# Patient Record
Sex: Female | Born: 1950 | Race: White | Hispanic: No | Marital: Married | Smoking: Never smoker
Health system: Southern US, Community
[De-identification: ages and names within clinical notes are randomized; demographics above are authoritative.]

## PROBLEM LIST (undated history)

## (undated) DIAGNOSIS — I639 Cerebral infarction, unspecified: Secondary | ICD-10-CM

## (undated) DIAGNOSIS — I10 Essential (primary) hypertension: Secondary | ICD-10-CM

## (undated) DIAGNOSIS — M549 Dorsalgia, unspecified: Secondary | ICD-10-CM

## (undated) DIAGNOSIS — C50919 Malignant neoplasm of unspecified site of unspecified female breast: Secondary | ICD-10-CM

## (undated) DIAGNOSIS — E785 Hyperlipidemia, unspecified: Secondary | ICD-10-CM

## (undated) DIAGNOSIS — F411 Generalized anxiety disorder: Secondary | ICD-10-CM

## (undated) DIAGNOSIS — M797 Fibromyalgia: Secondary | ICD-10-CM

## (undated) DIAGNOSIS — Z87440 Personal history of urinary (tract) infections: Secondary | ICD-10-CM

## (undated) HISTORY — DX: Fibromyalgia: M79.7

## (undated) HISTORY — DX: Personal history of urinary (tract) infections: Z87.440

## (undated) HISTORY — DX: Essential (primary) hypertension: I10

## (undated) HISTORY — DX: Dorsalgia, unspecified: M54.9

## (undated) HISTORY — DX: Generalized anxiety disorder: F41.1

## (undated) HISTORY — PX: OVARIAN CYST REMOVAL: SHX89

## (undated) HISTORY — DX: Hyperlipidemia, unspecified: E78.5

## (undated) HISTORY — DX: Malignant neoplasm of unspecified site of unspecified female breast: C50.919

## (undated) HISTORY — PX: ABDOMINAL HYSTERECTOMY: SHX81

## (undated) HISTORY — PX: BREAST LUMPECTOMY: SHX2

## (undated) HISTORY — DX: Cerebral infarction, unspecified: I63.9

## (undated) HISTORY — PX: TONSILLECTOMY: SUR1361

## (undated) HISTORY — PX: APPENDECTOMY: SHX54

---

## 2003-07-26 ENCOUNTER — Encounter: Admission: RE | Admit: 2003-07-26 | Discharge: 2003-07-26 | Payer: Self-pay | Admitting: Internal Medicine

## 2004-04-07 ENCOUNTER — Ambulatory Visit: Payer: Self-pay | Admitting: Family Medicine

## 2004-04-14 ENCOUNTER — Ambulatory Visit (HOSPITAL_BASED_OUTPATIENT_CLINIC_OR_DEPARTMENT_OTHER): Admission: RE | Admit: 2004-04-14 | Discharge: 2004-04-14 | Payer: Self-pay | Admitting: Urology

## 2004-04-14 ENCOUNTER — Encounter (INDEPENDENT_AMBULATORY_CARE_PROVIDER_SITE_OTHER): Payer: Self-pay | Admitting: Specialist

## 2004-04-14 ENCOUNTER — Ambulatory Visit (HOSPITAL_COMMUNITY): Admission: RE | Admit: 2004-04-14 | Discharge: 2004-04-14 | Payer: Self-pay | Admitting: Urology

## 2004-07-04 ENCOUNTER — Ambulatory Visit: Payer: Self-pay | Admitting: Family Medicine

## 2004-08-14 ENCOUNTER — Ambulatory Visit: Payer: Self-pay | Admitting: Family Medicine

## 2004-10-31 ENCOUNTER — Ambulatory Visit: Payer: Self-pay | Admitting: Family Medicine

## 2005-01-15 ENCOUNTER — Ambulatory Visit: Payer: Self-pay | Admitting: Family Medicine

## 2005-01-19 ENCOUNTER — Ambulatory Visit: Payer: Self-pay | Admitting: Family Medicine

## 2005-02-27 ENCOUNTER — Encounter: Admission: RE | Admit: 2005-02-27 | Discharge: 2005-02-27 | Payer: Self-pay | Admitting: Family Medicine

## 2005-03-12 ENCOUNTER — Encounter: Admission: RE | Admit: 2005-03-12 | Discharge: 2005-03-12 | Payer: Self-pay | Admitting: Family Medicine

## 2005-04-10 ENCOUNTER — Ambulatory Visit: Payer: Self-pay | Admitting: Family Medicine

## 2006-02-28 ENCOUNTER — Encounter: Admission: RE | Admit: 2006-02-28 | Discharge: 2006-02-28 | Payer: Self-pay | Admitting: Family Medicine

## 2006-07-02 ENCOUNTER — Ambulatory Visit: Payer: Self-pay | Admitting: Family Medicine

## 2006-07-02 LAB — CONVERTED CEMR LAB
ALT: 51 units/L — ABNORMAL HIGH (ref 0–40)
AST: 37 units/L (ref 0–37)
Albumin: 4.3 g/dL (ref 3.5–5.2)
Alkaline Phosphatase: 82 units/L (ref 39–117)
BUN: 15 mg/dL (ref 6–23)
Basophils Absolute: 0 10*3/uL (ref 0.0–0.1)
Basophils Relative: 0 % (ref 0.0–1.0)
Bilirubin, Direct: 0.1 mg/dL (ref 0.0–0.3)
CO2: 29 meq/L (ref 19–32)
Calcium: 9.3 mg/dL (ref 8.4–10.5)
Chloride: 106 meq/L (ref 96–112)
Cholesterol: 280 mg/dL (ref 0–200)
Creatinine, Ser: 0.8 mg/dL (ref 0.4–1.2)
Direct LDL: 212.8 mg/dL
Eosinophils Absolute: 0.3 10*3/uL (ref 0.0–0.6)
Eosinophils Relative: 3.6 % (ref 0.0–5.0)
GFR calc Af Amer: 96 mL/min
GFR calc non Af Amer: 79 mL/min
Glucose, Bld: 81 mg/dL (ref 70–99)
HCT: 38.8 % (ref 36.0–46.0)
HDL: 42.2 mg/dL (ref >39.0)
Hemoglobin: 13.4 g/dL (ref 12.0–15.0)
Lymphocytes Relative: 26.5 % (ref 12.0–46.0)
MCHC: 34.6 g/dL (ref 30.0–36.0)
MCV: 89.6 fL (ref 78.0–100.0)
Monocytes Absolute: 0.2 10*3/uL (ref 0.2–0.7)
Monocytes Relative: 2.1 % — ABNORMAL LOW (ref 3.0–11.0)
Neutro Abs: 5.4 10*3/uL (ref 1.4–7.7)
Neutrophils Relative %: 67.8 % (ref 43.0–77.0)
Platelets: 305 10*3/uL (ref 150–400)
Potassium: 3.9 meq/L (ref 3.5–5.1)
RBC: 4.34 M/uL (ref 3.87–5.11)
RDW: 12.2 % (ref 11.5–14.6)
Sodium: 142 meq/L (ref 135–145)
TSH: 0.95 microintl units/mL (ref 0.35–5.50)
Total Bilirubin: 0.9 mg/dL (ref 0.3–1.2)
Total CHOL/HDL Ratio: 6.6
Total Protein: 6.9 g/dL (ref 6.0–8.3)
Triglycerides: 168 mg/dL — ABNORMAL HIGH (ref 0–149)
VLDL: 34 mg/dL (ref 0–40)
WBC: 8 10*3/uL (ref 4.5–10.5)

## 2006-09-13 ENCOUNTER — Ambulatory Visit: Payer: Self-pay | Admitting: Family Medicine

## 2006-09-13 LAB — CONVERTED CEMR LAB
BUN: 16 mg/dL (ref 6–23)
Creatinine, Ser: 0.8 mg/dL (ref 0.4–1.2)

## 2006-09-16 ENCOUNTER — Ambulatory Visit: Payer: Self-pay | Admitting: Internal Medicine

## 2006-09-18 ENCOUNTER — Encounter: Admission: RE | Admit: 2006-09-18 | Discharge: 2006-09-18 | Payer: Self-pay | Admitting: Family Medicine

## 2006-09-20 DIAGNOSIS — R9409 Abnormal results of other function studies of central nervous system: Secondary | ICD-10-CM | POA: Insufficient documentation

## 2006-09-20 DIAGNOSIS — R209 Unspecified disturbances of skin sensation: Secondary | ICD-10-CM | POA: Insufficient documentation

## 2006-09-20 DIAGNOSIS — H02539 Eyelid retraction unspecified eye, unspecified lid: Secondary | ICD-10-CM | POA: Insufficient documentation

## 2006-09-24 ENCOUNTER — Telehealth: Payer: Self-pay | Admitting: Family Medicine

## 2007-02-11 ENCOUNTER — Ambulatory Visit: Payer: Self-pay | Admitting: Cardiology

## 2007-02-11 LAB — CONVERTED CEMR LAB
ALT: 40 units/L — ABNORMAL HIGH (ref 0–35)
AST: 29 units/L (ref 0–37)
Albumin: 4.1 g/dL (ref 3.5–5.2)
Alkaline Phosphatase: 72 units/L (ref 39–117)
Bilirubin, Direct: 0.1 mg/dL (ref 0.0–0.3)
Cholesterol: 198 mg/dL (ref 0–200)
HDL: 41.3 mg/dL (ref >39.0)
LDL Cholesterol: 121 mg/dL — ABNORMAL HIGH (ref 0–99)
Total Bilirubin: 0.7 mg/dL (ref 0.3–1.2)
Total CHOL/HDL Ratio: 4.8
Total Protein: 7 g/dL (ref 6.0–8.3)
Triglycerides: 178 mg/dL — ABNORMAL HIGH (ref 0–149)
VLDL: 36 mg/dL (ref 0–40)

## 2007-03-03 ENCOUNTER — Encounter: Admission: RE | Admit: 2007-03-03 | Discharge: 2007-03-03 | Payer: Self-pay | Admitting: Family Medicine

## 2007-03-05 ENCOUNTER — Encounter (INDEPENDENT_AMBULATORY_CARE_PROVIDER_SITE_OTHER): Payer: Self-pay | Admitting: *Deleted

## 2007-04-21 DIAGNOSIS — I10 Essential (primary) hypertension: Secondary | ICD-10-CM

## 2007-04-21 DIAGNOSIS — M7918 Myalgia, other site: Secondary | ICD-10-CM | POA: Insufficient documentation

## 2007-04-21 DIAGNOSIS — N390 Urinary tract infection, site not specified: Secondary | ICD-10-CM

## 2007-04-21 DIAGNOSIS — Z87440 Personal history of urinary (tract) infections: Secondary | ICD-10-CM | POA: Insufficient documentation

## 2007-04-21 DIAGNOSIS — E7800 Pure hypercholesterolemia, unspecified: Secondary | ICD-10-CM

## 2007-04-21 DIAGNOSIS — IMO0001 Reserved for inherently not codable concepts without codable children: Secondary | ICD-10-CM

## 2007-04-22 ENCOUNTER — Ambulatory Visit: Payer: Self-pay | Admitting: Family Medicine

## 2007-04-24 ENCOUNTER — Telehealth: Payer: Self-pay | Admitting: Family Medicine

## 2007-08-19 ENCOUNTER — Ambulatory Visit: Payer: Self-pay | Admitting: Family Medicine

## 2007-08-26 ENCOUNTER — Telehealth: Payer: Self-pay | Admitting: Family Medicine

## 2007-09-12 ENCOUNTER — Encounter: Admission: RE | Admit: 2007-09-12 | Discharge: 2007-09-12 | Payer: Self-pay | Admitting: Family Medicine

## 2007-09-12 ENCOUNTER — Encounter: Payer: Self-pay | Admitting: Family Medicine

## 2007-09-17 ENCOUNTER — Encounter (INDEPENDENT_AMBULATORY_CARE_PROVIDER_SITE_OTHER): Payer: Self-pay | Admitting: *Deleted

## 2007-09-24 ENCOUNTER — Ambulatory Visit: Payer: Self-pay | Admitting: Family Medicine

## 2007-09-26 LAB — CONVERTED CEMR LAB
ALT: 40 units/L — ABNORMAL HIGH (ref 0–35)
AST: 33 units/L (ref 0–37)
Albumin: 4.1 g/dL (ref 3.5–5.2)
Alkaline Phosphatase: 76 units/L (ref 39–117)
BUN: 13 mg/dL (ref 6–23)
Basophils Absolute: 0.1 10*3/uL (ref 0.0–0.1)
Basophils Relative: 1.2 % — ABNORMAL HIGH (ref 0.0–1.0)
Bilirubin, Direct: 0.1 mg/dL (ref 0.0–0.3)
CO2: 29 meq/L (ref 19–32)
Calcium: 9.2 mg/dL (ref 8.4–10.5)
Chloride: 109 meq/L (ref 96–112)
Cholesterol: 169 mg/dL (ref 0–200)
Creatinine, Ser: 0.9 mg/dL (ref 0.4–1.2)
Eosinophils Absolute: 0.3 10*3/uL (ref 0.0–0.7)
Eosinophils Relative: 5.2 % — ABNORMAL HIGH (ref 0.0–5.0)
GFR calc Af Amer: 83 mL/min
GFR calc non Af Amer: 69 mL/min
Glucose, Bld: 99 mg/dL (ref 70–99)
HCT: 39.8 % (ref 36.0–46.0)
HDL: 36.9 mg/dL — ABNORMAL LOW (ref >39.0)
Hemoglobin: 13.6 g/dL (ref 12.0–15.0)
LDL Cholesterol: 103 mg/dL — ABNORMAL HIGH (ref 0–99)
Lymphocytes Relative: 24.2 % (ref 12.0–46.0)
MCHC: 34.2 g/dL (ref 30.0–36.0)
MCV: 92.3 fL (ref 78.0–100.0)
Monocytes Absolute: 0.4 10*3/uL (ref 0.1–1.0)
Monocytes Relative: 6.1 % (ref 3.0–12.0)
Neutro Abs: 4 10*3/uL (ref 1.4–7.7)
Neutrophils Relative %: 63.3 % (ref 43.0–77.0)
Platelets: 240 10*3/uL (ref 150–400)
Potassium: 4.2 meq/L (ref 3.5–5.1)
RBC: 4.31 M/uL (ref 3.87–5.11)
RDW: 12.3 % (ref 11.5–14.6)
Sodium: 142 meq/L (ref 135–145)
TSH: 1.29 microintl units/mL (ref 0.35–5.50)
Total Bilirubin: 0.8 mg/dL (ref 0.3–1.2)
Total CHOL/HDL Ratio: 4.6
Total Protein: 6.8 g/dL (ref 6.0–8.3)
Triglycerides: 148 mg/dL (ref 0–149)
VLDL: 30 mg/dL (ref 0–40)
WBC: 6.3 10*3/uL (ref 4.5–10.5)

## 2008-03-03 ENCOUNTER — Encounter: Admission: RE | Admit: 2008-03-03 | Discharge: 2008-03-03 | Payer: Self-pay | Admitting: Family Medicine

## 2008-05-12 ENCOUNTER — Ambulatory Visit: Payer: Self-pay | Admitting: Family Medicine

## 2008-05-12 DIAGNOSIS — R4589 Other symptoms and signs involving emotional state: Secondary | ICD-10-CM | POA: Insufficient documentation

## 2008-05-18 ENCOUNTER — Ambulatory Visit: Payer: Self-pay | Admitting: Cardiovascular Disease

## 2008-07-01 ENCOUNTER — Ambulatory Visit: Payer: Self-pay | Admitting: Family Medicine

## 2008-07-13 ENCOUNTER — Telehealth (INDEPENDENT_AMBULATORY_CARE_PROVIDER_SITE_OTHER): Payer: Self-pay | Admitting: Internal Medicine

## 2008-08-18 ENCOUNTER — Encounter: Payer: Self-pay | Admitting: Family Medicine

## 2008-08-23 ENCOUNTER — Telehealth: Payer: Self-pay | Admitting: Family Medicine

## 2008-11-23 ENCOUNTER — Ambulatory Visit: Payer: Self-pay | Admitting: Family Medicine

## 2009-02-16 ENCOUNTER — Telehealth: Payer: Self-pay | Admitting: Family Medicine

## 2009-03-04 ENCOUNTER — Encounter: Admission: RE | Admit: 2009-03-04 | Discharge: 2009-03-04 | Payer: Self-pay | Admitting: Family Medicine

## 2009-03-11 ENCOUNTER — Encounter: Payer: Self-pay | Admitting: Family Medicine

## 2009-03-11 ENCOUNTER — Encounter: Admission: RE | Admit: 2009-03-11 | Discharge: 2009-03-11 | Payer: Self-pay | Admitting: Family Medicine

## 2009-03-14 ENCOUNTER — Encounter (INDEPENDENT_AMBULATORY_CARE_PROVIDER_SITE_OTHER): Payer: Self-pay | Admitting: *Deleted

## 2009-06-27 ENCOUNTER — Telehealth: Payer: Self-pay | Admitting: Family Medicine

## 2009-07-08 ENCOUNTER — Ambulatory Visit: Payer: Self-pay | Admitting: Internal Medicine

## 2009-08-08 ENCOUNTER — Telehealth: Payer: Self-pay | Admitting: Family Medicine

## 2010-01-02 ENCOUNTER — Telehealth: Payer: Self-pay | Admitting: Family Medicine

## 2010-03-06 ENCOUNTER — Encounter: Admission: RE | Admit: 2010-03-06 | Discharge: 2010-03-06 | Payer: Self-pay | Admitting: Family Medicine

## 2010-03-06 ENCOUNTER — Telehealth: Payer: Self-pay | Admitting: Family Medicine

## 2010-03-07 ENCOUNTER — Encounter: Payer: Self-pay | Admitting: Family Medicine

## 2010-03-08 ENCOUNTER — Encounter (INDEPENDENT_AMBULATORY_CARE_PROVIDER_SITE_OTHER): Payer: Self-pay | Admitting: *Deleted

## 2010-03-08 ENCOUNTER — Encounter: Payer: Self-pay | Admitting: Family Medicine

## 2010-03-14 ENCOUNTER — Ambulatory Visit: Payer: Self-pay | Admitting: Family Medicine

## 2010-03-14 ENCOUNTER — Encounter (INDEPENDENT_AMBULATORY_CARE_PROVIDER_SITE_OTHER): Payer: Self-pay | Admitting: *Deleted

## 2010-04-20 ENCOUNTER — Encounter (INDEPENDENT_AMBULATORY_CARE_PROVIDER_SITE_OTHER): Payer: Self-pay

## 2010-04-25 ENCOUNTER — Ambulatory Visit: Payer: Self-pay | Admitting: Gastroenterology

## 2010-04-25 ENCOUNTER — Ambulatory Visit: Payer: Self-pay | Admitting: Family Medicine

## 2010-05-15 ENCOUNTER — Encounter: Payer: Self-pay | Admitting: Gastroenterology

## 2010-05-15 ENCOUNTER — Ambulatory Visit
Admission: RE | Admit: 2010-05-15 | Discharge: 2010-05-15 | Payer: Self-pay | Source: Home / Self Care | Attending: Gastroenterology | Admitting: Gastroenterology

## 2010-05-15 LAB — HM COLONOSCOPY

## 2010-05-28 ENCOUNTER — Encounter: Payer: Self-pay | Admitting: Family Medicine

## 2010-06-06 NOTE — Medication Information (Signed)
Summary: Rx Order for [facility]  Rx Order for Labwork   Imported By: Beau Fanny 03/08/2010 15:24:06  _____________________________________________________________________  External Attachment:    Type:   Image     Comment:   External Document

## 2010-06-06 NOTE — Letter (Signed)
Summary: Pre Visit Letter Revised  [facility]  [address] [place], [place] [address]   Phone: [phone]  Fax: [phone]        03/14/2010 MRN: [id number] [facility] [address] [place], [place]  [address]             Procedure Date:  05/15/2010   Welcome to the Gastroenterology Division at [facility].    You are scheduled to see a nurse for your pre-procedure visit on 04/25/2010 at 1:00PM on the 3rd floor at [facility], [address]  We ask that you try to arrive at our office 15 minutes prior to your appointment time to allow for check-in.  Please take a minute to review the attached form.  If you answer "Yes" to one or more of the questions on the first page, we ask that you call the person listed at your earliest opportunity.  If you answer "No" to all of the questions, please complete the rest of the form and bring it to your appointment.    Your nurse visit will consist of discussing your medical and surgical history, your immediate family medical history, and your medications.   If you are unable to list all of your medications on the form, please bring the medication bottles to your appointment and we will list them.  We will need to be aware of both prescribed and over the counter drugs.  We will need to know exact dosage information as well.    Please be prepared to read and sign documents such as consent forms, a financial agreement, and acknowledgement forms.  If necessary, and with your consent, a friend or relative is welcome to sit-in on the nurse visit with you.  Please bring your insurance card so that we may make a copy of it.  If your insurance requires a referral to see a specialist, please bring your referral form from your primary care physician.  No co-pay is required for this nurse visit.     If you cannot keep your appointment, please call [phone] to cancel or reschedule prior to your appointment date.  This  allows [place] the opportunity to schedule an appointment for another patient in need of care.    Thank you for choosing [facility] for your medical needs.  We appreciate the opportunity to care for you.  Please visit [place] at our website  to learn more about our practice.  Sincerely, The Gastroenterology Division

## 2010-06-06 NOTE — Assessment & Plan Note (Signed)
Summary: cpx/dlo   Vital Signs:  Patient profile:   60 year old female Height:      64.25 inches Weight:      183.75 pounds BMI:     31.41 Temp:     98.1 degrees F oral Pulse rate:   76 / minute Pulse rhythm:   regular BP sitting:   128 / 80  (left arm) Cuff size:   regular  Vitals Entered By: Lewanda Rife LPN (March 14, 2010 2:36 PM) CC: CPX LMP complete hyst 1997   History of Present Illness: here for wellness exam  has been feeling good - no new med issues   thinks she may have dementia in the future  thinks her short term memory gets worse with time  some word searching at times , and needs reminders of things  she wants to watch this carefully experiences fibro fog -- ? this could be it   voltaren helps fibro pain  is interested in cymbalta -- to see if it will help with pain   was on effexor in the past -- did well with it -- was on it for emotional reasons years ago    wt is stable   good bp at 128/80  lipid-- on crestor trig 137 and HDL 42 and LDL 100 other labs ok    mam 10/11 nl  self exam - no lumps    colon screen-- wants to get colonscopy in jan if poss   Td 07 flu shot does not want one   nl dexa 09  calcium and vit D -- is good about  needs in pill box in am   hyst tot for b9 ov mass in past  no symptoms or problems at all     Allergies: 1)  Macrodantin  Past History:  Past Medical History: Last updated: 07/08/2009 Hypertension hyperlipidemia (lipid clinic briefly) back pain  fibromyalgia  frequent utis  anxiety/stress reaction  Past Surgical History: Last updated: 08/19/2007 Recurrent UTI's since age 22- hosp as a  child Hysterectomy-benign ovarian mass (1997)- total hyst  Dermoid tumor (1968 Vag delivery x 2 Bladder neck polyp (2005) 1 Short ureter Abd [place]- normal (01/2005)  Family History: Last updated: 05/12/2008 Father: HTN, dementia Mother: CABG age 55, OP, and thyroid problems (nodule) Siblings: 1 brother-  ETOH. 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 Eyes:  Denies blurring and eye irritation. CV:  Denies chest pain or discomfort, palpitations, shortness of breath with exertion, and swelling of feet. Resp:  Denies cough, shortness of breath, and wheezing. GI:  Denies abdominal pain, change in bowel habits, nausea, and vomiting. GU:  Denies abnormal vaginal bleeding, discharge, dysuria, and urinary frequency. MS:  Denies joint pain, joint redness, joint swelling, and cramps. Derm:  Denies itching, lesion(s), poor wound healing, and rash. Neuro:  Denies numbness and tingling. Psych:  mood is ok . Endo:  Denies cold intolerance, excessive thirst, excessive urination, and heat intolerance. Heme:  Denies abnormal bruising and bleeding.  Impression & Recommendations:  Problem # 1:  HEALTH MAINTENANCE EXAM (ICD-V70.0) Assessment Comment Only reviewed health habits including diet, exercise and skin cancer prevention reviewed health maintenance list and family history  rev wellness labs in detail   Problem # 2:  SCREENING, COLON CANCER (ICD-V76.51) Assessment: Comment Only ref for screening colonosc  Orders: Gastroenterology Referral (GI)  Problem # 3:  FIBROMYALGIA (ICD-729.1) Assessment: Deteriorated will try  cymbalta disc poss side eff and to use with caution with tryptans/ nsaids hope to get off nsaid if it works  f/u 1-2 mo -- will inc dose if helpful Her updated medication list for this problem  includes:    Diclofenac Sodium Cr 100 Mg Tb24 (Diclofenac sodium) .Marland Kitchen... Take 1 tablet by mouth once a day with food  Problem # 4:  HYPERCHOLESTEROLEMIA (ICD-272.0) Assessment: Unchanged good control with med and diet  rev labs in detail [facility]  Her updated medication list for this problem includes:    Crestor 5 Mg Tabs (Rosuvastatin calcium) .Marland Kitchen... 1 by mouth once daily  Problem # 5:  HYPERTENSION (ICD-401.9) Assessment: Unchanged  this is well controlled  no changes lab rev with pt  Her updated medication list for this problem includes:    Atenolol 25 Mg Tabs (Atenolol) .Marland Kitchen... Take 1 tablet by mouth once a day  BP today: 128/80 Prior BP: 132/82 (07/08/2009)  Labs Reviewed: K+: 4.2 (09/24/2007) Creat: : 0.9 (09/24/2007)   Chol: 169 (09/24/2007)   HDL: 36.9 (09/24/2007)   LDL: 103 (09/24/2007)   TG: 148 (09/24/2007)  Complete Medication List: 1)  Diclofenac Sodium Cr 100 Mg Tb24 (Diclofenac sodium) .... Take 1 tablet by mouth once a day with food 2)  Atenolol 25 Mg Tabs (Atenolol) .... Take 1 tablet by mouth once a day 3)  Imitrex 50 Mg Tabs (Sumatriptan succinate) .Marland Kitchen.. 1 by mouth as needed headache as directed 4)  Fish Oil 1000 Mg Caps (Omega-3 fatty acids) .... One by mouth qd 5)  Trimethaprim  .... Take by mouth as directed 6)  Vesicare 10 Mg Tabs (Solifenacin succinate) .... Take 1 tablet by mouth once a day 7)  Crestor 5 Mg Tabs (Rosuvastatin calcium) .Marland Kitchen.. 1 by mouth once daily 8)  Womens Multivitamin Plus Tabs (Multiple vitamins-minerals) .... Daily 9)  Tussionex Pennkinetic Er 8-10 Mg/28ml Lqcr (Chlorpheniramine-hydrocodone) .... 5 cc by mouth every 12 h as needed for cough symptoms 10)  Omeprazole 20 Mg Tbec (Omeprazole) .... Otc as directed. 11)  Cymbalta 30 Mg Cpep (Duloxetine hcl) .Marland Kitchen.. 1 by mouth once daily  Patient Instructions: 1)  update me if side effects of cymbalta 2)  no change in medicines  3)  we will refer for colonoscopy at check out 4)  folllow up  with me in 1-2 months for cymbalta check -- we may increase dose at this time  Prescriptions: CYMBALTA 30 MG CPEP (DULOXETINE HCL) 1 by mouth once daily  #30 x 3   Entered and Authorized by:   Judith Part MD   Signed by:   Judith Part MD on 03/14/2010   Method used:   Electronically to        [facility] (retail)       [address]       [place], [place]  [address]       Ph: [phone]       Fax: [phone]   RxID:   [id number] CRESTOR 5 MG  TABS (ROSUVASTATIN CALCIUM) 1 by mouth once daily  #30 x 11   Entered and Authorized by:   Judith Part MD   Signed by:   Judith Part MD on 03/14/2010   Method used:   Electronically to        [facility] (retail)       [address]       [place], [place]  [address]       Ph: [phone]       Fax: [phone]   RxID:   [id number] IMITREX 50 MG TABS (SUMATRIPTAN SUCCINATE) 1 by mouth as needed headache as directed  #3 months x 3   Entered and Authorized by:   Judith Part MD   Signed by:   Judith Part MD on 03/14/2010   Method used:   Electronically to        [facility] (retail)       [address]       [place], [place]  [address]       Ph: [phone]       Fax: [phone]   RxID:   [id number] ATENOLOL 25 MG TABS (ATENOLOL) Take 1 tablet by mouth once a day  #30 x 11   Entered and Authorized by:   Judith Part MD   Signed by:   Judith Part MD on 03/14/2010   Method used:   Electronically to        [facility] ([facility])       [address]       [place], [place]  [address]       Ph: [phone]       Fax: [phone]   RxID:   [id number] DICLOFENAC SODIUM CR 100 MG TB24 (DICLOFENAC SODIUM) Take 1 tablet by mouth once a day with food  #30 x 11   Entered and Authorized by:   Judith Part MD   Signed  by:   Judith Part MD on 03/14/2010   Method used:   Electronically to        [facility] (retail)       [address]       [place], [place]  [address]       Ph: [phone]       Fax: [phone]   RxID:   [id number]    Orders Added: 1)  Gastroenterology Referral [GI] 2)  Est. Patient 40-64 years [phone]    Current Allergies (reviewed today): MACRODANTIN

## 2010-06-06 NOTE — Progress Notes (Signed)
Summary: Diclofenac 100mg  XR   Phone Note Refill Request Call back at [phone] Message from:  [facility] on January 02, 2010 11:39 AM  Refills Requested: Medication #1:  DICLOFENAC SODIUM CR 100 MG TB24 Take 1 tablet by mouth once a day   Last Refilled: 11/27/2009 [facility] electronically request refill on diclofenac 100mg  Last filled date 11/27/09.Please advise.    Method Requested: Telephone to Pharmacy Initial call taken by: Lewanda Rife LPN,  January 02, 2010 11:39 AM  Follow-up for Phone Call        px written on EMR for call in  Follow-up by: Judith Part MD,  January 02, 2010 1:03 PM  Additional Follow-up for Phone Call Additional follow up Details #1::        [facility] instructed. Lewanda Rife LPN  January 02, 2010 2:13 PM     New/Updated Medications: DICLOFENAC SODIUM CR 100 MG TB24 (DICLOFENAC SODIUM) Take 1 tablet by mouth once a day with food Prescriptions: DICLOFENAC SODIUM CR 100 MG TB24 (DICLOFENAC SODIUM) Take 1 tablet by mouth once a day with food  #30 x 5   Entered and Authorized by:   Judith Part MD   Signed by:   Lewanda Rife LPN on 95/62/1308   Method used:   Telephoned to ...       [facility]ail[facility]       [address]       [place], [place]  [address]       Ph: [phone]       Fax: [phone]   RxID:   [id number]

## 2010-06-06 NOTE — Letter (Signed)
Summary: Results Follow up Letter  [facility] [facility]  [address] [place], [place] [address]   Phone: [phone]  Fax: [phone]    03/08/2010 MRN: [id number]  [facility] [address] [place], [place]  [address]  Dear Ms. Azucena Kuba,  The following are the results of your recent test(s):  Test         Result    Pap Smear:        Normal _____  Not Normal _____ Comments: ______________________________________________________ Cholesterol: LDL(Bad cholesterol):         Your goal is less than:         HDL (Good cholesterol):       Your goal is more than: Comments:  ______________________________________________________ Mammogram:        Normal __X___  Not Normal _____ Comments: Repeat in 1 year  ___________________________________________________________________ Hemoccult:        Normal _____  Not normal _______ Comments:    _____________________________________________________________________ Other Tests:    We routinely do not discuss normal results over the telephone.  If you desire a copy of the results, or you have any questions about this information we can discuss them at your next office visit.   Sincerely,    Sharilyn Sites for  Dr Roxy Manns

## 2010-06-06 NOTE — Progress Notes (Signed)
Summary: Diclofenac  Phone Note Refill Request Message from:  [facility] on June 27, 2009 9:08 AM  Refills Requested: Medication #1:  DICLOFENAC SODIUM CR 100 MG TB24 Take 1 tablet by mouth once a day [facility]   Last Fill Date:  05/26/2009   Pharmacy Phone:  [phone]   Method Requested: Electronic Initial call taken by: Delilah Shan CMA Duncan Dull),  June 27, 2009 9:08 AM  Follow-up for Phone Call        px written on EMR for call in  Follow-up by: Judith Part MD,  June 27, 2009 9:32 AM  Additional Follow-up for Phone Call Additional follow up Details #1::        Rx called to pharmacy Additional Follow-up by: Linde Gillis CMA Duncan Dull),  June 27, 2009 9:52 AM    Prescriptions: DICLOFENAC SODIUM CR 100 MG TB24 (DICLOFENAC SODIUM) Take 1 tablet by mouth once a day  #30 x 5   Entered and Authorized by:   Judith Part MD   Signed by:   Linde Gillis CMA (AAMA) on 06/27/2009   Method used:   Telephoned to ...         RxID:   [id number]

## 2010-06-06 NOTE — Progress Notes (Signed)
Summary: wants order for labwork  Phone Note Call from Patient   Caller: Patient Call For: Judith Part MD Summary of Call: Pt is coming in on 11/8 and wants lab work prior.  She is asking that order be faxed to her at [facility], fax number [phone], Eulah Citizen. Initial call taken by: Lowella Petties CMA, AAMA,  March 06, 2010 10:16 AM  Follow-up for Phone Call        order on px pad for comp met/ tsh / cbc with diff/ lipid  in IN box Follow-up by: Judith Part MD,  March 07, 2010 8:23 AM  Additional Follow-up for Phone Call Additional follow up Details #1::        Lab request faxed to [phone] as instructed. Patient notified as instructed by telephone. Lewanda Rife LPN  March 07, 2010 2:29 PM

## 2010-06-06 NOTE — Progress Notes (Signed)
Summary: imitrex  Phone Note Refill Request Message from:  [facility] on August 08, 2009 11:29 AM  Refills Requested: Medication #1:  IMITREX 50 MG TABS 1 by mouth as needed headache as directed [facility]   Last Fill Date:  04/24/2007   Pharmacy Phone:  [phone]   Method Requested: Electronic Initial call taken by: Delilah Shan CMA (AAMA),  August 08, 2009 11:29 AM  Follow-up for Phone Call        px written on EMR for call in  Follow-up by: Judith Part MD,  August 08, 2009 12:15 PM  Additional Follow-up for Phone Call Additional follow up Details #1::        [facility] to [facility] pharmacy as instructed. Lewanda Rife LPN  August 09, 7827 3:11 PM     New/Updated Medications: IMITREX 50 MG TABS (SUMATRIPTAN SUCCINATE) 1 by mouth as needed headache as directed Prescriptions: IMITREX 50 MG TABS (SUMATRIPTAN SUCCINATE) 1 by mouth as needed headache as directed  #3 months x 3   Entered and Authorized by:   Judith Part MD   Signed by:   Lewanda Rife LPN on 56/21/3086   Method used:   Telephoned to ...       [facility]ail[facility]       [address]       [place], [place]  [address]       Ph: [phone]       Fax: [phone]   RxID:   [id number]

## 2010-06-06 NOTE — Assessment & Plan Note (Signed)
Summary: DR Milinda Antis PT/OK [facility] PT(S)--CHEST CONGEST W/COUGH-STC   Vital Signs:  Patient profile:   60 year old female Height:      64.25 inches (163.19 cm) Weight:      183.0 pounds (83.18 kg) O2 Sat:      96 % on Room air Temp:     98.7 degrees F (37.06 degrees C) oral Pulse rate:   70 / minute BP sitting:   132 / 82  (left arm) Cuff size:   regular  Vitals Entered By: Orlan Leavens (July 08, 2009 10:03 AM)  O2 Flow:  Room air CC: Ongoing cough/ chest congestion/ sore throat now x's 3 weeks, URI symptoms Is Patient Diabetic? No Pain Assessment Patient in pain? no        Primary Care Provider:  Colon Flattery Tower MD  CC:  Ongoing cough/ chest congestion/ sore throat now x's 3 weeks and URI symptoms.  History of Present Illness:  URI Symptoms      This is a 60 year old woman who presents with URI symptoms.  The symptoms began 3 weeks ago.  The severity is described as moderate.  The patient reports nasal congestion, clear nasal discharge, sore throat, dry cough, and sick contacts, but denies productive cough.  The patient denies fever, stiff neck, and dyspnea.  The patient also reports itchy throat, sneezing, seasonal symptoms, response to antihistamine, headache, and severe fatigue.  The patient denies muscle aches.  The patient denies the following risk factors for Strep sinusitis: Strep exposure and tender adenopathy.    Current Medications (verified): 1)  Diclofenac Sodium Cr 100 Mg Tb24 (Diclofenac Sodium) .... Take 1 Tablet By Mouth Once A Day 2)  Atenolol 25 Mg Tabs (Atenolol) .... Take 1 Tablet By Mouth Once A Day 3)  Imitrex 50 Mg Tabs (Sumatriptan Succinate) .Marland Kitchen.. 1 By Mouth As Needed Headache As Directed 4)  Fish Oil 1000 Mg  Caps (Omega-3 Fatty Acids) .... One By Mouth Qd 5)  Trimethaprim .... Take By Mouth As Directed 6)  Vesicare 10 Mg  Tabs (Solifenacin Succinate) .... Take 1 Tablet By Mouth Once A Day 7)  Crestor 5 Mg  Tabs (Rosuvastatin Calcium) .Marland Kitchen.. 1 By  Mouth Once Daily 8)  Womens Multivitamin Plus  Tabs (Multiple Vitamins-Minerals) .... 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  The patient denies anorexia, weight loss, chest pain, and abdominal pain.    Physical Exam  General:  alert, well-developed, well-nourished, and cooperative to examination.   mild hoarseness noted, congested cough Eyes:  vision grossly intact; pupils equal, round and reactive to light.  conjunctiva and lids normal.    Ears:  normal pinnae bilaterally, without erythema, swelling, or tenderness to palpation. TMs clear, without effusion, or cerumen impaction. Hearing grossly normal bilaterally  Mouth:  teeth and gums in good repair; mucous membranes moist, without lesions or ulcers. oropharynx clear without exudate, mod erythema.  Lungs:  normal respiratory effort, no intercostal retractions or use of accessory muscles; normal breath sounds bilaterally - no crackles and no wheezes.  Heart:  normal rate, regular rhythm, no murmur, and no rub. BLE without edema.   Impression & Recommendations:  Problem # 1:  BRONCHITIS NOT SPECIFIED AS ACUTE OR CHRONIC (ICD-490)  Her updated medication list for this problem includes:    Tussionex Pennkinetic Er 8-10 Mg/28ml Lqcr (Chlorpheniramine-hydrocodone) .[facility] 5 cc by mouth every 12 h as needed for cough symptoms    Azithromycin 250 Mg Tabs (Azithromycin) .Marland Kitchen... 2 tabs by mouth today, then 1 by mouth daily starting tomorrow  Take antibiotics and other medications as  directed. Encouraged to push clear liquids, get enough rest, and take acetaminophen as needed. To be seen in 5-7 days if no improvement, sooner if worse.  Orders: Prescription Created Electronically [phone])  Complete Medication List: 1)  Diclofenac Sodium Cr 100 Mg Tb24 (Diclofenac sodium) .... Take 1 tablet by mouth once a day 2)  Atenolol 25 Mg Tabs (Atenolol) .... Take 1 tablet by mouth once a day 3)  Imitrex 50 Mg Tabs (Sumatriptan succinate) .Marland Kitchen.. 1 by mouth as needed headache as directed 4)  Fish Oil 1000 Mg Caps (Omega-3 fatty acids) .... One by mouth qd 5)  Trimethaprim  .... Take by mouth as directed 6)  Vesicare 10 Mg Tabs (Solifenacin succinate) .... Take 1 tablet by mouth once a day 7)  Crestor 5 Mg Tabs (Rosuvastatin calcium) .Marland Kitchen.. 1 by mouth once daily 8)  Womens Multivitamin Plus Tabs (Multiple vitamins-minerals) .... Daily 9)  Tussionex Pennkinetic Er 8-10 Mg/65ml Lqcr (Chlorpheniramine-hydrocodone) .... 5 cc by mouth every 12 h as needed for cough symptoms 10)  Azithromycin 250 Mg Tabs (Azithromycin) .... 2 tabs by mouth today, then 1 by mouth daily starting tomorrow  Patient Instructions: 1)  it was good to see you today. 2)  Zpack and Tussionex as discussed - your antibiotic prescription has been electronically submitted to your pharmacy. Please take as directed. Contact our office if you believe you're having problems with the medication(s).  3)  Get plenty of rest, drink lots of clear liquids, and use Tylenol or Ibuprofen for fever and comfort. Return in 7-10 days if you're not better:sooner if you're feeling worse. ok to continue Theraflu as needed  Prescriptions: AZITHROMYCIN 250 MG TABS (AZITHROMYCIN) 2 tabs by mouth today, then 1 by mouth daily starting tomorrow  #6 x 0   Entered and Authorized by:   Newt Lukes MD   Signed by:   Newt Lukes MD on 07/08/2009   Method used:   Electronically to        [facility]       [address]       [place], [place]  [address]       Ph: [phone]       Fax: [phone]   RxID:   [id number] TUSSIONEX PENNKINETIC ER 8-10 MG/5ML LQCR (CHLORPHENIRAMINE-HYDROCODONE) 5 cc by mouth every 12 h as needed for cough symptoms  #8oz x 0   Entered and Authorized by:   Newt Lukes MD   Signed by:   Newt Lukes MD on 07/08/2009   Method used:   Print then Give to Patient   RxID:   [id number]

## 2010-06-06 NOTE — Miscellaneous (Signed)
Summary: Mammgram update   Clinical Lists Changes  Observations: Added new observation of MAMMO DUE: 03/2011 (03/08/2010 15:42) Added new observation of MAMMOGRAM: normal (03/06/2010 15:42)      Preventive Care Screening  Mammogram:    Date:  03/06/2010    Next Due:  03/2011    Results:  normal

## 2010-06-08 NOTE — Assessment & Plan Note (Signed)
Summary: 1-2 month follow up for cymbalta check/rbh   Vital Signs:  Patient profile:   60 year old female Height:      64.25 inches Weight:      181.50 pounds BMI:     31.02 Temp:     97.7 degrees F oral Pulse rate:   68 / minute Pulse rhythm:   regular BP sitting:   128 / 84  (left arm) Cuff size:   regular  Vitals Entered By: Lewanda Rife LPN (April 25, 2010 3:23 PM) CC: 1-2 month f/u for Cymbalta, Abdominal Pain   History of Present Illness: here for f/u of cymbalta for fibromyalgia  is sleeping better and deeper for sure -- and that is helping her day time energy level  is in more pain lately  ? weather effects  is still diclofenac  no exercise   mood wise - no significant changes  mood has been ok overall    no acess to water exercise - unfortunately  wt is down 2 lb bmi is 31  bp fine     Allergies: 1)  Macrodantin  Past History:  Past Medical History: Last updated: 07/08/2009 Hypertension hyperlipidemia (lipid clinic briefly) back pain  fibromyalgia  frequent utis  anxiety/stress reaction  Past Surgical History: Last updated: 08/19/2007 Recurrent UTI's since age 40- hosp as a  child Hysterectomy-benign ovarian mass (1997)- total hyst  Dermoid tumor (1968 Vag delivery x 2 Bladder neck polyp (2005) 1 Short ureter Abd [place]- normal (01/2005)  Family History: Last updated: 05/12/2008 Father: HTN, dementia Mother: CABG age 406, OP, and thyroid problems (nodule) Siblings: 1 brother- ETOH. 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 Eyes:  Denies blurring, eye irritation, and eye pain. CV:  Denies chest pain or discomfort, palpitations, shortness of breath with exertion, and swelling of feet. Resp:  Denies cough, shortness of breath, and wheezing. GI:  Denies indigestion and nausea. GU:  Denies dysuria and urinary frequency. MS:  Complains of joint pain, muscle aches, and stiffness; denies joint redness, joint swelling, cramps, and muscle weakness. Derm:  Denies itching, lesion(s), poor wound healing, and rash. Neuro:  Denies headaches, numbness, tingling, and weakness. Psych:  mood is ok. Endo:  Denies cold intolerance, excessive thirst, excessive urination, and heat intolerance. Heme:  Denies abnormal bruising and bleeding.  Physical Exam  General:  overweight but generally well appearing  Head:  normocephalic, atraumatic, and no abnormalities observed.   Eyes:  vision grossly intact, pupils equal, pupils round, and pupils reactive to light.  no conjunctival pallor, injection or icterus  Mouth:  pharynx pink and moist.   Neck:  supple with full rom and no masses or thyromegally, no JVD or carotid bruit  Chest Wall:  No deformities, masses, or tenderness noted. Lungs:  normal respiratory effort, no intercostal retractions or use of accessory muscles; normal breath sounds bilaterally - no crackles and no wheezes.    Heart:  normal rate, regular rhythm, no murmur, and no rub. BLE without edema. 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   Neurologic:  sensation intact to light touch, gait normal, and DTRs symmetrical and normal.   Skin:  Intact without suspicious lesions or rashes Cervical Nodes:  No lymphadenopathy noted Psych:  normal affect, talkative and pleasant    Impression & Recommendations:  Problem  # 1:  FIBROMYALGIA (ICD-729.1) Assessment Improved  sleep has improved with cymbalta and perhaps mood a bit but not pain so far will adv dose to 60 mg and update  pt aware to call if any adv effects or mood changes enc low impact exercise f/u 2-3 mo Her updated medication list for this problem includes:    Diclofenac Sodium Cr 100 Mg Tb24 (Diclofenac sodium) .[facility]... Take 1 tablet by mouth once a day with food  Orders: Prescription Created Electronically [phone])  Complete Medication List: 1)  Diclofenac Sodium Cr 100 Mg Tb24 (Diclofenac sodium) .... Take 1 tablet by mouth once a day with food 2)  Atenolol 25 Mg Tabs (Atenolol) .... Take 1 tablet by mouth once a day 3)  Imitrex 50 Mg Tabs (Sumatriptan succinate) .Marland Kitchen.. 1 by mouth as needed headache as directed 4)  Fish Oil 1000 Mg Caps (Omega-3 fatty acids) .... One by mouth daily 5)  Trimethaprim  .... Take by mouth as directed 6)  Vesicare 10 Mg Tabs (Solifenacin succinate) .... Take 1 tablet by mouth once a day 7)  Crestor 5 Mg Tabs (Rosuvastatin calcium) .Marland Kitchen.. 1 by mouth once daily 8)  Womens Multivitamin Plus Tabs (Multiple vitamins-minerals) .... Daily 9)  Tussionex Pennkinetic Er 8-10 Mg/68ml Lqcr (Chlorpheniramine-hydrocodone) .... 5 cc by mouth every 12 h as needed for cough symptoms 10)  Omeprazole 20 Mg Tbec (Omeprazole) .... Otc as directed. 11)  Cymbalta 60 Mg Cpep (Duloxetine hcl) .Marland Kitchen.. 1 by mouth once daily 12)  Moviprep 100 Gm Solr (Peg-kcl-nacl-nasulf-na asc-c) .... As per prep instructions.   Patient Instructions: 1)  increase cymbalta from 30 to 60 mg once daily  2)  new px sent to your pharmacy  3)  if any side effects or problems let me know  4)  follow up in 2-3 months  5)  think about low impact exercise  6)  easy/ slow begginers yoga or water exercise  Prescriptions: CYMBALTA 60 MG CPEP (DULOXETINE HCL) 1 by mouth once daily  #30 x 11   Entered and Authorized by:   Judith Part MD   Signed by:   Judith Part MD on 04/25/2010   Method used:   Electronically to        [facility] ([facility]ail)       [address]       [place], [place]  [address]       Ph: [phone]       Fax: [phone]   RxID:   [id number]    Orders Added: 1)  Prescription Created Electronically [G8553] 2)  Est. Patient Level III [44010]    Current Allergies (reviewed today): MACRODANTIN

## 2010-06-08 NOTE — Miscellaneous (Signed)
Summary: Lec previsit  Clinical Lists Changes  Medications: Added new medication of MOVIPREP 100 GM  SOLR (PEG-KCL-NACL-NASULF-NA ASC-C) As per prep instructions. - Signed Rx of MOVIPREP 100 GM  SOLR (PEG-KCL-NACL-NASULF-NA ASC-C) As per prep instructions.;  #1 x 0;  Signed;  Entered by: Ulis Rias RN;  Authorized by: Rachael Fee MD;  Method used: Electronically to [facility] [address], [place], [place], [place]  [address], Ph: [phone], Fax: [phone] Observations: Added new observation of ALLERGY REV: Done (04/25/2010 13:00)    Prescriptions: MOVIPREP 100 GM  SOLR (PEG-KCL-NACL-NASULF-NA ASC-C) As per prep instructions.  #1 x 0   Entered by:   Ulis Rias RN   Authorized by:   Rachael Fee MD   Signed by:   Ulis Rias RN on 04/25/2010   Method used:   Electronically to        [facility]       [address]       [place], [place]  [address]       Ph: [phone]       Fax: [phone]   RxID:   [id number]

## 2010-06-08 NOTE — Procedures (Signed)
Summary: Colonoscopy  Patient: [name] Note: All result statuses are Final unless otherwise noted.  Tests: (1) Colonoscopy (COL)   COL Colonoscopy           DONE     [facility]     [address]     [place], [place]  [address]           COLONOSCOPY PROCEDURE REPORT           PATIENT:  Amy, Mills  MR#:  [id number]     BIRTHDATE:  Dec 12, 1950, 59 yrs. old  GENDER:  female     ENDOSCOPIST:  Rachael Fee, MD     REF. BY:  Marne A. Milinda Antis, M.D.     PROCEDURE DATE:  05/15/2010     PROCEDURE:  Diagnostic Colonoscopy     ASA CLASS:  Class II     INDICATIONS:  Routine Risk Screening     MEDICATIONS:   Fentanyl 50 mcg IV, Versed 7 mg IV           DESCRIPTION OF PROCEDURE:   After the risks benefits and     alternatives of the procedure were thoroughly explained, informed     consent was obtained.  Digital rectal exam was performed and     revealed no rectal masses.   The LB [id number] endoscope     was introduced through the anus and advanced to the cecum, which     was identified by both the appendix and ileocecal valve, without     limitations.  The quality of the prep was good, using MoviPrep.     The instrument was then slowly withdrawn as the colon was fully     examined.     <<PROCEDUREIMAGES>>     FINDINGS:  Mild diverticulosis was found in the sigmoid to     descending colon segments (see image1).  This was otherwise a     normal examination of the colon (see image3, image4, and image5).     Retroflexed views in the rectum revealed no abnormalities.    The     scope was then withdrawn from the patient and the procedure     completed.     COMPLICATIONS:  None           ENDOSCOPIC IMPRESSION:     1) Mild diverticulosis in the sigmoid to descending colon     segments     2) Otherwise normal examination; no polyps or cancers           RECOMMENDATIONS:     1) You should continue to follow colorectal cancer screening     guidelines for "routine risk"  patients with a repeat colonoscopy     in 10 years. There is no need for FOBT (stool) testing for at     least 5 years.           REPEAT EXAM:  10 years           ______________________________     Rachael Fee, MD           n.     eSIGNED:   Rachael Fee at 05/15/2010 09:31 AM           [name], [id number]  Note: An exclamation mark (!) indicates a result that was not dispersed into the flowsheet. Document Creation Date: 05/15/2010 9:31 AM _______________________________________________________________________  (1) Order result status: Final Collection or observation date-time: 05/15/2010 09:28 Requested date-time:  Receipt date-time:  Reported date-time:  Referring Physician:   Ordering Physician: Rob Bunting [phone]) Specimen Source:  Source: Launa Grill Order Number: [id number] Lab site:   Appended Document: Colonoscopy    Clinical Lists Changes  Observations: Added new observation of COLONNXTDUE: 05/2020 (05/15/2010 11:34)

## 2010-06-08 NOTE — Letter (Signed)
Summary: Moviprep Instructions  [facility]  [address]   [place], [place] [address]   Phone: [phone]  Fax: [phone]       Amy Mills    10/23/1950    MRN: [id number]        Procedure Day Dorna Bloom: Monday, 05-15-10     Arrival Time: 8:30 a.m.     Procedure Time: 9:30 a.m.     Location of Procedure:                    x   [facility] (4th Floor)   PREPARATION FOR COLONOSCOPY WITH MOVIPREP   Starting 5 days prior to your procedure 05-10-10 do not eat nuts, seeds, popcorn, corn, beans, peas,  salads, or any raw vegetables.  Do not take any fiber supplements (e.g. Metamucil, Citrucel, and Benefiber).  THE DAY BEFORE YOUR PROCEDURE         DATE: 05-14-10   DAY: Sunday  1.  Drink clear liquids the entire day-NO SOLID FOOD  2.  Do not drink anything colored red or purple.  Avoid juices with pulp.  No orange juice.  3.  Drink at least 64 oz. (8 glasses) of fluid/clear liquids during the day to prevent dehydration and help the prep work efficiently.  CLEAR LIQUIDS INCLUDE: Water Jello Ice Popsicles Tea (sugar ok, no milk/cream) Powdered fruit flavored drinks Coffee (sugar ok, no milk/cream) Gatorade Juice: apple, white grape, white cranberry  Lemonade Clear bullion, consomm, broth Carbonated beverages (any kind) Strained chicken noodle soup Hard Candy                             4.  In the morning, mix first dose of MoviPrep solution:    Empty 1 Pouch A and 1 Pouch B into the disposable container    Add lukewarm drinking water to the top line of the container. Mix to dissolve    Refrigerate (mixed solution should be used within 24 hrs)  5.  Begin drinking the prep at 5:00 p.m. The MoviPrep container is divided by 4 marks.   Every 15 minutes drink the solution down to the next mark (approximately 8 oz) until the full liter is complete.   6.  Follow completed prep with 16 oz of clear liquid of your choice (Nothing red or purple).   Continue to drink clear liquids until bedtime.  7.  Before going to bed, mix second dose of MoviPrep solution:    Empty 1 Pouch A and 1 Pouch B into the disposable container    Add lukewarm drinking water to the top line of the container. Mix to dissolve    Refrigerate  THE DAY OF YOUR PROCEDURE      DATE: 05-15-10  DAY: Monday  Beginning at 4:30 a.m. (5 hours before procedure):         1. Every 15 minutes, drink the solution down to the next mark (approx 8 oz) until the full liter is complete.  2. Follow completed prep with 16 oz. of clear liquid of your choice.    3. You may drink clear liquids until 7:30 a.m. (2 HOURS BEFORE PROCEDURE).   MEDICATION INSTRUCTIONS  Unless otherwise instructed, you should take regular prescription medications with a small sip of water   as early as possible the morning of your procedure.         OTHER INSTRUCTIONS  You will need a responsible adult  at least 60 years of age to accompany you and drive you home.   This person must remain in the waiting room during your procedure.  Wear loose fitting clothing that is easily removed.  Leave jewelry and other valuables at home.  However, you may wish to bring a book to read or  an iPod/MP3 player to listen to music as you wait for your procedure to start.  Remove all body piercing jewelry and leave at home.  Total time from sign-in until discharge is approximately 2-3 hours.  You should go home directly after your procedure and rest.  You can resume normal activities the  day after your procedure.  The day of your procedure you should not:   Drive   Make legal decisions   Operate machinery   Drink alcohol   Return to work  You will receive specific instructions about eating, activities and medications before you leave.    The above instructions have been reviewed and explained to me by  Ulis Rias RN  April 25, 2010 1:27 PM     I fully understand and can verbalize  these instructions _____________________________ Date _________

## 2010-06-10 ENCOUNTER — Encounter: Payer: Self-pay | Admitting: Family Medicine

## 2010-07-13 ENCOUNTER — Telehealth: Payer: Self-pay | Admitting: Family Medicine

## 2010-07-18 NOTE — Progress Notes (Signed)
Summary: [facility]  Phone Note From Pharmacy   Caller: [facility] Summary of Call: [facility] is asking for new scripts for cymbalta, atenolol, crestor, diclofenac.  Forms are in your box,  Dr. Royden Purl pt. and she is out until monday. Initial call taken by: Lowella Petties CMA, AAMA,  July 13, 2010 8:40 AM  Follow-up for Phone Call        filled.  please input my DEA/ NPI.  Follow-up by: Eustaquio Boyden  MD,  July 13, 2010 10:26 PM  Additional Follow-up for Phone Call Additional follow up Details #1::        Forms faxed.                Lowella Petties CMA, AAMA  July 14, 2010 8:14 AM

## 2010-07-25 ENCOUNTER — Encounter: Payer: Self-pay | Admitting: Family Medicine

## 2010-07-25 ENCOUNTER — Ambulatory Visit (INDEPENDENT_AMBULATORY_CARE_PROVIDER_SITE_OTHER): Payer: 59 | Admitting: Family Medicine

## 2010-07-25 VITALS — BP 124/78 | HR 64 | Temp 98.3°F | Ht 64.0 in | Wt 173.0 lb

## 2010-07-25 DIAGNOSIS — J019 Acute sinusitis, unspecified: Secondary | ICD-10-CM | POA: Insufficient documentation

## 2010-07-25 DIAGNOSIS — IMO0001 Reserved for inherently not codable concepts without codable children: Secondary | ICD-10-CM

## 2010-07-25 MED ORDER — DULOXETINE HCL 30 MG PO CPEP: 30.0000 mg | ORAL_CAPSULE | Freq: Every day | ORAL | Status: DC

## 2010-07-25 MED ORDER — AZITHROMYCIN 250 MG PO TABS: ORAL_TABLET | ORAL | Status: AC

## 2010-07-25 NOTE — Assessment & Plan Note (Signed)
Treat with zithromax as pt is intol to augmentin Disc sinus hygiene-saline nasal spray Update if not improved

## 2010-07-25 NOTE — Assessment & Plan Note (Signed)
No improvement in 60 above the 30 and in fact not enough imp to continue Plan will be to decrease to 30 for 2 weeks and then discontinue Pt will consider gabapentin or lyrica in the future- but not now Enc further low impact exercise and good diet

## 2010-07-25 NOTE — Patient Instructions (Signed)
Take cymbalta down to 30 mg daily for 2 weeks and then stop if you want to  I sent px to your pharmacy Also take zpak for the sinus infection Try nasal saline spray for congestion  Please update me if sinus symptoms do not improve

## 2010-07-25 NOTE — Progress Notes (Signed)
Subjective:    Patient ID: Amy Mills, female    DOB: 15-Jul-1950, 60 y.o.   MRN: [id number]  HPI Here for f/u of fibro on cymbalta Cannot really decide how much it is helping at the 60 mg dose Was sleeping better on the 30 vs the 60  Also helped mood at 30 also -- then at 60 her affect was blah and she felt in the dumps for a while-- seems better now  Pain has not changed  ? Not noticed enough improvement to continue it honestly  No side effects overall  Still on voltaren also  Does not want to try other meds or pain control  Is used to the pain  Good and bad days  Pain varies -- as of late pain has been worse in L hip and up into her back   Wt did decrease 8 lb   ? If that was related to anything - eating habits did not really change    Last time she went to have her px filled- the pharmacy forgot to fill the atenolol Started having headaches and inc bp  Back on since Friday     Has actually had a headache for 2 days (poss fever-- some chills and aches)  Had a bad cold 6-7 weeks ago -- still has congestion and drainage --  ? Allergies  Wonders if she could have a sinus infection overall  Feels pressure in face --under eyes and above them  Mostly on the R side  Blowing out mostly clear mucous -- was previously green and yellow --now some flecks of blood  Some cough as well -- mostly at night -- dry Usually does not have env allergies   No longer using cough medicine  Still has a mild dry cough, however   Past Medical History  Diagnosis Date  . Hypertension   . HLD (hyperlipidemia)   . Back pain   . Fibromyalgia   . History of recurrent UTIs   . Anxiety reaction     History   Social History  . Marital Status: Married    Spouse Name: N/A    Number of Children: N/A  . Years of Education: N/A   Occupational History  . Not on file.   Social History Main Topics  . Smoking status: Never Smoker   . Smokeless tobacco: Not on file  . Alcohol Use: Yes  . Drug  Use: No  . Sexually Active:    Other Topics Concern  . Not on file   Social History Narrative  . No narrative on file    No past surgical history on file.  Family History  Problem Relation Age of Onset  . Thyroid nodules Mother   . Heart disease Mother   . Hypertension Father   . Dementia Father   . Alcohol abuse Brother   . Hyperlipidemia Daughter   . Hypertension Daughter   . Cancer Maternal Aunt     breast  . Cancer Paternal Aunt     breast     Review of Systems  Constitutional: Positive for fatigue. Negative for fever and chills.  HENT: Positive for congestion, rhinorrhea, postnasal drip and sinus pressure. Negative for ear pain, nosebleeds and facial swelling.   Eyes: Negative for redness and visual disturbance.  Respiratory: Positive for cough. Negative for shortness of breath and wheezing.   Cardiovascular: Negative for chest pain and leg swelling.  Gastrointestinal: Negative for abdominal pain.  Genitourinary: Negative for frequency.  Musculoskeletal: Positive for myalgias and back pain. Negative for joint swelling.  Skin: Negative for color change and rash.  Neurological: Positive for headaches. Negative for syncope, weakness, light-headedness and numbness.  Hematological: Does not bruise/bleed easily.  Psychiatric/Behavioral: Positive for sleep disturbance. The patient is not nervous/anxious.        Objective:   Physical Exam  Constitutional: She appears well-developed and well-nourished. No distress.  HENT:  Head: Normocephalic and atraumatic.       bilat frontal and maxillary sinus tenderness  Eyes: Conjunctivae are normal. Pupils are equal, round, and reactive to light. Right eye exhibits no discharge. Left eye exhibits no discharge.  Neck: Neck supple. No thyromegaly present.  Cardiovascular: Normal rate and regular rhythm.   Pulmonary/Chest: Effort normal and breath sounds normal. She has no rales.  Musculoskeletal: Normal range of motion. She  exhibits tenderness. She exhibits no edema.       Diffusely tender shoulder girdle  No trigger points on back today No acute joint changes  Lymphadenopathy:    She has no cervical adenopathy.  Neurological: She displays normal reflexes. She exhibits normal muscle tone.  Skin: Skin is warm and dry. No rash noted.  Psychiatric: She has a normal mood and affect.   Past Medical History  Diagnosis Date  . Hypertension   . HLD (hyperlipidemia)   . Back pain   . Fibromyalgia   . History of recurrent UTIs   . Anxiety reaction    No past surgical history on file.  reports that she has never smoked. She does not have any smokeless tobacco history on file. She reports that she drinks alcohol. She reports that she does not use illicit drugs. family history includes Alcohol abuse in her brother; Cancer in her maternal aunt and paternal aunt; Dementia in her father; Heart disease in her mother; Hyperlipidemia in her daughter; Hypertension in her daughter and father; and Thyroid nodules in her mother. Allergies  Allergen Reactions  . Augmentin Diarrhea    Diarrhea   . Nitrofurantoin     REACTION: hives           Assessment & Plan:

## 2010-07-27 ENCOUNTER — Encounter: Payer: Self-pay | Admitting: Family Medicine

## 2010-09-19 NOTE — Assessment & Plan Note (Signed)
[facility]                               LIPID CLINIC NOTE   NAME:Amy Mills, Defino                         MRN:          [id number]  DATE:09/16/2006                            DOB:          [birth year]    The patient is seen in the lipid clinic for evaluation of medication  titration associated with her hyperlipidemia in the setting of  previously elevated liver function tests when using Vytorin.  The  patient states she is not certain how high her LFTs went.  She  discontinue Vytorin approximately a year ago because it did not agree  with her liver but has had improvement since that time.   She does pay attention to her diet.  She reveals a breakfast pertinent  for wheat toast with peanut butter and Nutrigrain bar or banana, always  with orange juice.  Lunches are often canned soup of the Progresso line  and fruit.  Dinners include chicken 3 times a week which is  unfortunately often fried, lots of creamed soups are used in cooking,  multiple starches at meals, and 2-4 servings of ham and/or red meat on a  weekly basis.  Deserts are often ice cream with chocolate sauce and/or a  banana on most nights of the week.   The patient uses alcohol very sparingly.  She does not smoke tobacco  products.  The patient states that she does exercise by walking to work  and taking the stairs though she has no other formalized pattern for  exercising at this time.   The patient does have a positive family history that all primary family  members do have positive cardiac history requiring coronary artery  bypass grafting at young ages.   The patient states that she has been compliant with her current drug  therapy which includes the following list:  1. Voltaren.  2. Atenolol.  3. Multivitamin.  4. Trimethoprim prophylaxis for recurrent UTI,  5. Fish oil 1 gram daily.   REVIEW OF SYSTEMS:  As stated in the HPI, otherwise negative.   PHYSICAL EXAMINATION:   VITAL SIGNS:  Weight today is 181 and 3/4 pounds.  Blood pressure is 138/72, heart rate is 68.   LABORATORY:  On Sep 16, 2006, reveal a total cholesterol of 280,  triglycerides 168, HDL 42.2, LDL 212.8.  The patient's LFTs are within  normal limits and her Framingham Risk calculates to 5% with 3 risk  factors.   ASSESSMENT:  The patient does not meet primary secondary goals for lipid-  lowering therapy at this time.   She has agreed to begin Crestor 5 mg daily.  She will try to change over  to olive oil instead of vegetable oils and make more lean meat and baked  instead of fried meat selections.  She will try walking after dinner  each night with her family.  She will come back in six weeks for labs  and followup at that time.   I appreciate the opportunity to see this kind patient.      Shelby Dubin, PharmD, BCPS,  CPP       Rollene Rotunda, MD, [facility]    MP/MedQ  DD: 09/20/2006  DT: 09/20/2006  Job #: [id number]   cc:   Marne A. Milinda Antis, MD

## 2010-09-19 NOTE — Assessment & Plan Note (Signed)
[facility]                            CARDIOLOGY OFFICE NOTE   Amy, Mills                         MRN:          [id number]  DATE:05/18/2008                            DOB:          Dec 19, 1950    Amy Mills is a delightful 60 year old patient referred by Dr. Milinda Antis for  chest pain, palpitations, and occasional dyspnea.   Interestingly, I did a treadmill on her husband last week.   The patient has been having symptoms for 8 weeks.  They are clearly  related to stress.  She has a new job that is causing her to work many  hours.  She initially had some central chest pressure at work.  It  lasted 5-15 minutes.  It is nonexertional in the center of her chest and  radiates up towards her neck and jaw.  Nothing she does makes it better.  Nothing she does particularly makes it worse.  The patient indicates  that over the last 8 weeks, she has had 1-2 episodes per week.   There is no associated diaphoresis, palpitations.  She does get  occasional shortness of breath.  She admits that this all may be anxiety  and stress due to the increased work load.  She has not been tried on  Xanax or Prozac.   The patient has not had previous cardiac disease.  She has not had a  previous stress test.   Her coronary risk factors are limited.  She is nonhypertensive,  nondiabetic.  Cholesterol status is not known to me.  She is a  nonsmoker.   Family history is positive with mother having bypass surgery.   The patient's past medical history is remarkable for previous  hysterectomy and left bunion surgery.   REVIEW OF SYSTEMS:  Otherwise remarkable for arthritis, fatigue,  anxiety, and depression.   The patient currently works for diagnostic setting at histopathology  labs.  The one that is giving her the most stress is the one that she  set up at [employer] which is extremely busy.  This is a new job  for her since April.  She is otherwise happily  married.  She has 2  children and is fairly sedentary.  She does not smoke or drink.   Family history is remarkable for mother being alive with bypass surgery.  Father is alive and has Alzheimer.   Her current meds include:  1. Crestor 5 mg a day.  2. Voltaren.  3. Vesicare 10 mg a day.  4. Triamterene 1000 mg a day.  5. Atenolol 25 a day.  6. Fish oil and vitamins.   She is allergic to [facility].   PHYSICAL EXAMINATION:  GENERAL:  Remarkable for somewhat anxious female.  VITAL SIGNS:  Weight is 183, blood pressure 130/80, pulse 72 and  regular, respiratory rate 14, afebrile.  HEENT:  Unremarkable.  NECK:  Carotids normal without bruit.  No lymphadenopathy, thyromegaly,  or JVP elevation.  LUNGS:  Clear diaphragmatic motion.  No wheezing.  CARDIAC:  S1 and S2.  Normal heart sounds.  PMI normal.  ABDOMEN:  Benign.  Bowel sounds positive.  No AAA.  No tenderness.  No  bruit.  No hepatosplenomegaly, hepatojugular reflux, or tenderness.  EXTREMITIES:  Distal pulses intact.  No edema.  NEUROLOGIC:  Nonfocal.  SKIN:  Warm and dry.  No muscular weakness.   EKG shows sinus rhythm with somewhat poor R wave progression.   IMPRESSION:  1. Chest pain, atypical.  Followup stress echo.  2. Hypercholesterolemia.  Continue Crestor.  Lipid and liver profile      in 6 months.  3. Anxiety and depression.  Consider addition of Xanax or Prozac to      her regimen.  Hopefully, her job will get a little less hectic for      her, but she clearly is having symptomatic anxiety about this.  4. Abnormal EKG, shows poor R wave progression, officially read.  I      cannot rule out any anterior wall myocardial infarction.  When she      has her stress test, we will do it with echocardiographic imaging      to rule out regional wall motion abnormalities and to improve      sensitivity in regards to infection and coronary artery disease in      a middle-aged female.  5. Dyspnea, functional.  The  patient's symptoms are primarily at rest.      She is otherwise limited by her previous bunion surgery.  Her lung      exam is normal and she has had a normal chest x-ray in the past      year.   Further recommendation will be based on the results of her stress echo.  If it is normal, she will follow with Dr. Milinda Antis and only see Cardiology  in a p.r.n. basis.     Amy Mills. Eden Emms, MD, [facility]  Electronically Signed    PCN/MedQ  DD: 05/18/2008  DT: 05/19/2008  Job #: [id number]

## 2010-09-22 NOTE — Op Note (Signed)
NAME:  Amy Mills, Amy Mills                  ACCOUNT NO.:  [id number]   MEDICAL RECORD NO.:  [id number]          PATIENT TYPE:  AMB   LOCATION:  [facility]                         FACILITY:  [facility]   PHYSICIAN:  Sigmund I. Patsi Sears, M.D.DATE OF BIRTH:  [birth year]   DATE OF PROCEDURE:  04/14/2004  DATE OF DISCHARGE:                                 OPERATIVE REPORT   PREOPERATIVE DIAGNOSIS:  Chronic recurrent urinary tract infections.   POSTOPERATIVE DIAGNOSES:  1.  Foreshortened left ureteral tunnel.  2.  International grade I right vesicoureteral reflux.  3.  Chronic recurrent urinary tract infections.   SURGEON:  Sigmund I. Patsi Sears, M.D.   ANESTHESIA:  General LMA.   PREPARATION:  After the appropriate pre-anesthesia, the patient is brought  to the operating room and placed on the operating room table in the dorsal  supine position, where general LMA anesthesia was introduced.  She was then  replaced in the dorsal lithotomy position where the pubis was prepped with  Betadine solution and draped in the usual fashion.   PROCEDURE:  Cystourethroscopy was accomplished.  The patient was noted to  hold 1000 cc.  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  A foreshortened left vesicoureteral tunnel was identified.  The  cystogram was accomplished, which showed right-sided grade 1 vesicoureteral  reflux.  Following this, the bladder was drained of fluid.  Marcaine and  Pyridium were inserted into the bladder.  Marcaine and Kenalog injected into  the bladder base.  A B&O suppository placed.  Patient was given IV Toradol,  awakened, and taken to the recovery room in good condition.     Sigm   SIT/MEDQ  D:  04/14/2004  T:  04/14/2004  Job:  [id number]

## 2010-12-11 ENCOUNTER — Ambulatory Visit (INDEPENDENT_AMBULATORY_CARE_PROVIDER_SITE_OTHER): Payer: 59 | Admitting: Family Medicine

## 2010-12-11 ENCOUNTER — Encounter: Payer: Self-pay | Admitting: Family Medicine

## 2010-12-11 VITALS — BP 140/82 | HR 70 | Temp 98.8°F | Ht 65.5 in | Wt 182.0 lb

## 2010-12-11 DIAGNOSIS — J209 Acute bronchitis, unspecified: Secondary | ICD-10-CM

## 2010-12-11 DIAGNOSIS — R05 Cough: Secondary | ICD-10-CM

## 2010-12-11 MED ORDER — HYDROCOD POLST-CHLORPHEN POLST 10-8 MG/5ML PO LQCR: 5.0000 mL | Freq: Every evening | ORAL | Status: DC | PRN

## 2010-12-11 MED ORDER — AZITHROMYCIN 250 MG PO TABS: 250.0000 mg | ORAL_TABLET | Freq: Every day | ORAL | Status: AC

## 2010-12-11 NOTE — Progress Notes (Signed)
  Subjective:    Patient ID: Amy Mills, female    DOB: 1950/06/01, 60 y.o.   MRN: [id number]  HPI  2 weeks of respiratory symptoms. Started in her chest. Not much nasal congestion. Never a smoker.  Coughing with mild production of sputum No fever, chills or sweats.  No n/v/d. Not sleeping well at all.  No nasal symptoms or ear symptoms.  The PMH, PSH, Social History, Family History, Medications, and allergies have been reviewed in [facility], and have been updated if relevant.   Review of Systems ROS: GEN: Acute illness details above GI: Tolerating PO intake GU: maintaining adequate hydration and urination Pulm: No SOB Interactive and getting along well at home.  Otherwise, ROS is as per the HPI.     Objective:   Physical Exam   Physical Exam  Blood pressure 140/82, pulse 70, temperature 98.8 F (37.1 C), temperature source Oral, height 5' 5.5" (1.664 m), weight 182 lb (82.555 kg), last menstrual period 05/08/1995, SpO2 97.00%.  GEN: A and O x 3. WDWN. NAD.    ENT: Nose clear, ext NML.  No LAD.  No JVD.  TM's clear. Oropharynx clear.  PULM: Normal WOB, no distress. No crackles, wheezes, rhonchi. CV: RRR, no M/G/R, No rubs, No JVD.   ABD: S, NT, ND, + BS. No rebound. No guarding. No HSM.   EXT: warm and well-perfused, No c/c/e. PSYCH: Pleasant and conversant.     Assessment & Plan:   1. Bronchitis, acute   2. Cough    A/P: Acute bronchitis: discussed plan of care. Given length of symptoms and overall history, will treat with ABX in this case. Continue with additional supportive care, cough medications, liquids, sleep, steam / vaporizer.

## 2011-01-10 ENCOUNTER — Ambulatory Visit (INDEPENDENT_AMBULATORY_CARE_PROVIDER_SITE_OTHER): Payer: 59 | Admitting: Family Medicine

## 2011-01-10 ENCOUNTER — Encounter: Payer: Self-pay | Admitting: Family Medicine

## 2011-01-10 DIAGNOSIS — J40 Bronchitis, not specified as acute or chronic: Secondary | ICD-10-CM | POA: Insufficient documentation

## 2011-01-10 DIAGNOSIS — R42 Dizziness and giddiness: Secondary | ICD-10-CM

## 2011-01-10 MED ORDER — MECLIZINE HCL 25 MG PO TABS: 25.0000 mg | ORAL_TABLET | Freq: Three times a day (TID) | ORAL | Status: AC | PRN

## 2011-01-10 NOTE — Patient Instructions (Signed)
I think you have vertigo that may be caused by the virus you have had  Take the meclizine three times daily (watch out for sedation ) for about 3-4 days and then use as needed If not improving or if worse at any time please let me know  Please update if cough worsens or fever or other symptoms - your lung exam is reassuring today

## 2011-01-10 NOTE — Assessment & Plan Note (Signed)
This is very slowly improving  Needing less cough med Nl lung exam Continue to monitor

## 2011-01-10 NOTE — Progress Notes (Signed)
Subjective:    Patient ID: Amy Mills, female    DOB: June 13, 1950, 60 y.o.   MRN: [id number]  HPI Here for uri symptoms  Was seen by Dr Patsy Lager 8/6 for bronchitis and tx with zpak and tussionex  When she made the appt - her cough got better and then got worse  Now that is settling down a little bit- has not needed the tussionex  Now having nausea and vomiting for 4 day  It feels likes vertigo -- and room is spinning  No headache or congestion  Ears feel ok  Tried pepto = no help  No med changes  Done with zpack   Stopped cymbalta 2 weeks ago   More pain in outside of the hips - fibromyalgia has been worse lately  Patient Active Problem List  Diagnoses  . HYPERCHOLESTEROLEMIA  . STRESS REACTION, ACUTE, WITH EMOTIONAL DISTURBANCE  . RETRACTION/LAG, EYELID  . HYPERTENSION  . UTI'S, RECURRENT  . FIBROMYALGIA  . PARESTHESIA  . MRI, BRAIN, ABNORMAL  . Sinusitis acute  . Vertigo  . Bronchitis   Past Medical History  Diagnosis Date  . Hypertension   . HLD (hyperlipidemia)   . Back pain   . Fibromyalgia   . History of recurrent UTIs   . Anxiety reaction    No past surgical history on file. History  Substance Use Topics  . Smoking status: Never Smoker   . Smokeless tobacco: Not on file  . Alcohol Use: Yes   Family History  Problem Relation Age of Onset  . Thyroid nodules Mother   . Heart disease Mother   . Hypertension Father   . Dementia Father   . Alcohol abuse Brother   . Hyperlipidemia Daughter   . Hypertension Daughter   . Cancer Maternal Aunt     breast  . Cancer Paternal Aunt     breast   Allergies  Allergen Reactions  . Augmentin Diarrhea    Diarrhea   . Nitrofurantoin     REACTION: hives   Current Outpatient Prescriptions on File Prior to Visit  Medication Sig Dispense Refill  . atenolol (TENORMIN) 25 MG tablet Take 25 mg by mouth daily.        . chlorpheniramine-HYDROcodone (TUSSIONEX) 10-8 MG/5ML LQCR Take 5 mLs by mouth at bedtime as  needed.  240 mL  0  . Diclofenac Sodium CR 100 MG 24 hr tablet Take 100 mg by mouth daily.        . fish oil-omega-3 fatty acids 1000 MG capsule Take 1 g by mouth daily.        . Multiple Vitamins-Minerals (WOMENS MULTI PO) Take 1 tablet by mouth daily.        Marland Kitchen omeprazole (PRILOSEC) 20 MG capsule Take 20 mg by mouth daily.        . rosuvastatin (CRESTOR) 5 MG tablet Take 5 mg by mouth daily.        . solifenacin (VESICARE) 10 MG tablet Take 10 mg by mouth daily.       . SUMAtriptan (IMITREX) 50 MG tablet Take 50 mg by mouth as needed.            Review of Systems Review of Systems  Constitutional: Negative for fever, appetite change, fatigue and unexpected weight change.  Eyes: Negative for pain and visual disturbance.  Respiratory: neg for sob or wheeze, pos for cough  ENT neg for ear pain/ pos for congestion Cardiovascular: Negative for cp or palpitations  Gastrointestinal: Negative for nausea, diarrhea and constipation.  Genitourinary: Negative for urgency and frequency.  Skin: Negative for pallor or rash   MSK pos for muscle pain from fibromyalgia Neurological: Negative for weakness, , numbness and headaches.  Hematological: Negative for adenopathy. Does not bruise/bleed easily.  Psychiatric/Behavioral: Negative for dysphoric mood. The patient is not nervous/anxious.          Objective:   Physical Exam  Constitutional: She is oriented to person, place, and time. She appears well-developed and well-nourished. No distress.       Fatigued but well appearing   HENT:  Head: Normocephalic and atraumatic.  Mouth/Throat: Oropharynx is clear and moist.  Eyes: Conjunctivae and EOM are normal. Pupils are equal, round, and reactive to light.       Fundi grossly wnl  2-3 beasts of horizontal nystagmus bilat   Neck: Normal range of motion. Neck supple. No JVD present. No thyromegaly present.  Cardiovascular: Normal rate, regular rhythm and normal heart sounds.   Pulmonary/Chest:  Effort normal and breath sounds normal. No respiratory distress. She has no wheezes. She has no rales. She exhibits no tenderness.       Good air exch  Harsh bs at the bases   Abdominal: Soft. Bowel sounds are normal. She exhibits no distension and no mass. There is no tenderness.  Musculoskeletal: Normal range of motion. She exhibits no edema and no tenderness.  Lymphadenopathy:    She has no cervical adenopathy.  Neurological: She is alert and oriented to person, place, and time. She has normal strength and normal reflexes. No cranial nerve deficit or sensory deficit. She displays a negative Romberg sign. Coordination and gait normal.  Skin: Skin is warm and dry. No erythema. No pallor.  Psychiatric: She has a normal mood and affect.          Assessment & Plan:

## 2011-01-10 NOTE — Assessment & Plan Note (Signed)
Suspect from a viral syndrome/labyrinthitis Reassuring exam  Given meclizine to use ATC for 3-4 d then prn to see if we can break cycle If ha or fever or neck stiffness or rash- will update - or if no imp in 1 wk

## 2011-01-18 ENCOUNTER — Other Ambulatory Visit: Payer: Self-pay | Admitting: Family Medicine

## 2011-01-18 DIAGNOSIS — Z1231 Encounter for screening mammogram for malignant neoplasm of breast: Secondary | ICD-10-CM

## 2011-02-05 ENCOUNTER — Ambulatory Visit (INDEPENDENT_AMBULATORY_CARE_PROVIDER_SITE_OTHER)
Admission: RE | Admit: 2011-02-05 | Discharge: 2011-02-05 | Disposition: A | Discharge status: Home / Self Care | Payer: 59 | Source: Ambulatory Visit | Attending: Family Medicine | Admitting: Family Medicine

## 2011-02-05 ENCOUNTER — Ambulatory Visit (INDEPENDENT_AMBULATORY_CARE_PROVIDER_SITE_OTHER): Payer: 59 | Admitting: Family Medicine

## 2011-02-05 ENCOUNTER — Encounter: Payer: Self-pay | Admitting: Family Medicine

## 2011-02-05 VITALS — BP 138/76 | HR 72 | Temp 98.1°F | Ht 65.5 in | Wt 186.2 lb

## 2011-02-05 DIAGNOSIS — J40 Bronchitis, not specified as acute or chronic: Secondary | ICD-10-CM

## 2011-02-05 MED ORDER — LEVOFLOXACIN 500 MG PO TABS: 500.0000 mg | ORAL_TABLET | Freq: Every day | ORAL | Status: AC

## 2011-02-05 NOTE — Patient Instructions (Signed)
Take levaquin daily for 7 days Keep up good fluids tussionex as needed for cough If worse or fever - call If not improving in 7 days- also call

## 2011-02-05 NOTE — Progress Notes (Signed)
Subjective:    Patient ID: Amy Mills, female    DOB: 1950/11/21, 61 y.o.   MRN: [id number]  HPI Has been sick on and off since aug Cough got much worse last week More nasal congested -- does not think she caught anything new  Vertigo improved and nausea also  No sinus pain or pressure - nasal d/c is fairly clear Yellow green d/c - from chest   Wheezy and barky cough both  Hard to catch a breath  Had a zpak very early in course - august   Sore throat for 2-3 d then gone Ears are fine   No n/v No rash No tick or insect bites   Patient Active Problem List  Diagnoses  . HYPERCHOLESTEROLEMIA  . STRESS REACTION, ACUTE, WITH EMOTIONAL DISTURBANCE  . RETRACTION/LAG, EYELID  . HYPERTENSION  . UTI'S, RECURRENT  . FIBROMYALGIA  . PARESTHESIA  . MRI, BRAIN, ABNORMAL  . Sinusitis acute  . Vertigo  . Bronchitis   Past Medical History  Diagnosis Date  . Hypertension   . HLD (hyperlipidemia)   . Back pain   . Fibromyalgia   . History of recurrent UTIs   . Anxiety reaction    No past surgical history on file. History  Substance Use Topics  . Smoking status: Never Smoker   . Smokeless tobacco: Not on file  . Alcohol Use: Yes   Family History  Problem Relation Age of Onset  . Thyroid nodules Mother   . Heart disease Mother   . Hypertension Father   . Dementia Father   . Alcohol abuse Brother   . Hyperlipidemia Daughter   . Hypertension Daughter   . Cancer Maternal Aunt     breast  . Cancer Paternal Aunt     breast   Allergies  Allergen Reactions  . Augmentin Diarrhea    Diarrhea   . Nitrofurantoin     REACTION: hives   Current Outpatient Prescriptions on File Prior to Visit  Medication Sig Dispense Refill  . atenolol (TENORMIN) 25 MG tablet Take 25 mg by mouth daily.        . chlorpheniramine-HYDROcodone (TUSSIONEX) 10-8 MG/5ML LQCR Take 5 mLs by mouth at bedtime as needed.  240 mL  0  . Diclofenac Sodium CR 100 MG 24 hr tablet Take 100 mg by mouth daily.         . fish oil-omega-3 fatty acids 1000 MG capsule Take 1 g by mouth daily.        . Multiple Vitamins-Minerals (WOMENS MULTI PO) Take 1 tablet by mouth daily.        Marland Kitchen omeprazole (PRILOSEC) 20 MG capsule Take 20 mg by mouth daily.        . rosuvastatin (CRESTOR) 5 MG tablet Take 5 mg by mouth daily.        . solifenacin (VESICARE) 10 MG tablet Take 10 mg by mouth daily.       Marland Kitchen trimethoprim (TRIMPEX) 100 MG tablet Take 100 mg by mouth daily.       . meclizine (ANTIVERT) 25 MG tablet Take 1 tablet (25 mg total) by mouth 3 (three) times daily as needed for dizziness or nausea (warn- may sedate ).  30 tablet  1  . SUMAtriptan (IMITREX) 50 MG tablet Take 50 mg by mouth as needed.            Review of Systems Review of Systems  Constitutional: Negative for fever, appetite change, fatigue and unexpected weight change.  Eyes: Negative for pain and visual disturbance.  ENT no sinus pain , some congestion  Respiratory: Negative for sob, pos for cough and chest soreness/tightness Cardiovascular: Negative for cp or palpitations    Gastrointestinal: Negative for nausea, diarrhea and constipation.  Genitourinary: Negative for urgency and frequency.  Skin: Negative for pallor or rash   Neurological: Negative for weakness, light-headedness, numbness and headaches.  Hematological: Negative for adenopathy. Does not bruise/bleed easily.  Psychiatric/Behavioral: Negative for dysphoric mood. The patient is not nervous/anxious.          Objective:   Physical Exam  Constitutional: She appears well-developed and well-nourished. No distress.  HENT:  Head: Normocephalic and atraumatic.  Right Ear: External ear normal.  Left Ear: External ear normal.  Mouth/Throat: Oropharynx is clear and moist.       Nares are moderately congested and injected No sinus tenderness Some post nasal drip  Eyes: Conjunctivae and EOM are normal. Pupils are equal, round, and reactive to light. Right eye exhibits no  discharge. Left eye exhibits no discharge.  Neck: Normal range of motion. Neck supple. No JVD present. No thyromegaly present.  Cardiovascular: Normal rate, regular rhythm and normal heart sounds.   Pulmonary/Chest: Effort normal and breath sounds normal. No respiratory distress. She has no wheezes. She has no rales.       Diffusely harsh bs throughout occ scattered rhonchi No rales No wheeze  Abdominal: Soft. Bowel sounds are normal. There is no tenderness.  Lymphadenopathy:    She has no cervical adenopathy.  Skin: Skin is warm and dry. No rash noted. No erythema. No pallor.  Psychiatric: She has a normal mood and affect.          Assessment & Plan:

## 2011-02-08 NOTE — Assessment & Plan Note (Signed)
With 2 mo of intermittent uri symptoms - s/p tx with zpak in aug (may have been viral at this point) Will cover with levaquin given length of illness sympt care disc  Update if not improved  cxr neg - pend radiol overread

## 2011-03-08 ENCOUNTER — Ambulatory Visit
Admission: RE | Admit: 2011-03-08 | Discharge: 2011-03-08 | Disposition: A | Discharge status: Home / Self Care | Payer: 59 | Source: Ambulatory Visit | Attending: Family Medicine | Admitting: Family Medicine

## 2011-03-08 DIAGNOSIS — Z1231 Encounter for screening mammogram for malignant neoplasm of breast: Secondary | ICD-10-CM

## 2011-03-12 ENCOUNTER — Encounter: Payer: Self-pay | Admitting: *Deleted

## 2011-07-22 ENCOUNTER — Other Ambulatory Visit: Payer: Self-pay | Admitting: Family Medicine

## 2011-07-23 NOTE — Telephone Encounter (Signed)
Sent in

## 2011-07-23 NOTE — Telephone Encounter (Signed)
[facility] request refill imitrex 50 mg. Pt last seen 02/05/11. Dr Milinda Antis left office today sick and has not been on computer that I can tell since she left. Would you please consider this refill. Thank you.

## 2011-09-03 ENCOUNTER — Telehealth: Payer: Self-pay | Admitting: Family Medicine

## 2011-09-03 NOTE — Telephone Encounter (Signed)
Order written on px and in IN basket

## 2011-09-03 NOTE — Telephone Encounter (Signed)
Pt is needing order for [facility] sent over [facility] [employer] her job. Fax number [phone] Eulah Citizen.

## 2011-09-04 NOTE — Telephone Encounter (Signed)
Order faxed to [place] at [phone].

## 2011-09-14 ENCOUNTER — Ambulatory Visit (INDEPENDENT_AMBULATORY_CARE_PROVIDER_SITE_OTHER): Payer: 59 | Admitting: Family Medicine

## 2011-09-14 ENCOUNTER — Encounter: Payer: Self-pay | Admitting: Family Medicine

## 2011-09-14 VITALS — BP 102/60 | HR 72 | Temp 98.4°F | Wt 186.5 lb

## 2011-09-14 DIAGNOSIS — Z0000 Encounter for general adult medical examination without abnormal findings: Secondary | ICD-10-CM

## 2011-09-14 DIAGNOSIS — I10 Essential (primary) hypertension: Secondary | ICD-10-CM

## 2011-09-14 DIAGNOSIS — E7800 Pure hypercholesterolemia, unspecified: Secondary | ICD-10-CM

## 2011-09-14 MED ORDER — SUMATRIPTAN SUCCINATE 50 MG PO TABS: 50.0000 mg | ORAL_TABLET | ORAL | Status: DC | PRN

## 2011-09-14 NOTE — Patient Instructions (Signed)
Keep up the good job with diet and exercise working towards weight loss, think about weight watchers or app "my fitness pal" to help you If you are interested in a shingles/zoster vaccine - call your insurance to check on coverage,( you should not get it within 1 month of other vaccines) , then call us for a prescription  for it to take to a pharmacy that gives the shot  I will be on the look out for labs

## 2011-09-14 NOTE — Assessment & Plan Note (Signed)
On crestor Good diet Pending labs from [facility] to review

## 2011-09-14 NOTE — Assessment & Plan Note (Signed)
bp in fair control at this time  No changes needed  Disc lifstyle change with low sodium diet and exercise   

## 2011-09-14 NOTE — Assessment & Plan Note (Signed)
Reviewed health habits including diet and exercise and skin cancer prevention Also reviewed health mt list, fam hx and immunizations  Wellness labs pending from her work/ [facility]

## 2011-09-14 NOTE — Progress Notes (Signed)
Subjective:    Patient ID: Amy Mills, female    DOB: [birth year], 61 y.o.   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 HYPERCHOLESTEROLEMIA  . STRESS REACTION, ACUTE, WITH EMOTIONAL DISTURBANCE  . RETRACTION/LAG, EYELID  . HYPERTENSION  . UTI'S, RECURRENT  . FIBROMYALGIA  . PARESTHESIA  . MRI, BRAIN, ABNORMAL  . Sinusitis acute  . Vertigo  . Bronchitis  . Routine general medical examination at a health care facility   Past Medical History  Diagnosis Date  . Hypertension   . HLD (hyperlipidemia)   . Back pain   . Fibromyalgia   . History of recurrent UTIs   . Anxiety reaction    No past surgical history on file. History  Substance Use Topics  . Smoking status: Never Smoker   . Smokeless tobacco: Not on file  . Alcohol Use: Yes     Occasional   Family History  Problem Relation Age of Onset  . Thyroid nodules Mother   . Heart disease Mother   . Hypertension Father   . Dementia Father   . Alcohol  abuse Brother   . Hyperlipidemia Daughter   . Hypertension Daughter   . Cancer Maternal Aunt     breast  . Cancer Paternal Aunt     breast   Allergies  Allergen Reactions  . Amoxicillin-Pot Clavulanate Diarrhea    Diarrhea   . Nitrofurantoin     REACTION: hives   Current Outpatient Prescriptions on File Prior to Visit  Medication Sig Dispense Refill  . atenolol (TENORMIN) 25 MG tablet Take 25 mg by mouth daily.        . Diclofenac Sodium CR 100 MG 24 hr tablet Take 100 mg by mouth daily.        . fish oil-omega-3 fatty acids 1000 MG capsule Take 1 g by mouth daily.        . Multiple Vitamins-Minerals (WOMENS MULTI PO) Take 1 tablet by mouth daily.        Marland Kitchen omeprazole (PRILOSEC) 20 MG capsule Take 20 mg by mouth daily.        . rosuvastatin (CRESTOR) 5 MG tablet Take 5 mg by mouth daily.        . solifenacin (VESICARE) 10 MG tablet Take 10 mg by mouth daily.       Marland Kitchen  SUMAtriptan (IMITREX) 50 MG tablet Take 1 tablet (50 mg total) by mouth every 2 (two) hours as needed for migraine (maximum 2 doses per day for migraine).  27 tablet  3  . trimethoprim (TRIMPEX) 100 MG tablet Take 100 mg by mouth daily.       . meclizine (ANTIVERT) 25 MG tablet Take 1 tablet (25 mg total) by mouth 3 (three) times daily as needed for dizziness or nausea (warn- may sedate ).  30 tablet  1      Review of Systems Review of Systems  Constitutional: Negative for fever, appetite change, fatigue and unexpected weight change.  Eyes: Negative for pain and visual disturbance.  Respiratory: Negative for cough and shortness of breath.   Cardiovascular: Negative for cp or palpitations    Gastrointestinal: Negative for nausea, diarrhea and constipation.  Genitourinary: Negative for urgency and frequency.  Skin: Negative for pallor or rash   Neurological: Negative for weakness, light-headedness, numbness and headaches.  Hematological: Negative for adenopathy. Does not bruise/bleed easily.  Psychiatric/Behavioral:  Negative for dysphoric mood. The patient is not nervous/anxious.         Objective:   Physical Exam  Constitutional: She appears well-developed and well-nourished. No distress.  HENT:  Head: Normocephalic and atraumatic.  Mouth/Throat: Oropharynx is clear and moist.  Eyes: Conjunctivae and EOM are normal. Pupils are equal, round, and reactive to light.  Neck: Normal range of motion. Neck supple. No JVD present. Carotid bruit is not present. No thyromegaly present.  Cardiovascular: Normal rate, regular rhythm, normal heart sounds and intact distal pulses.  Exam reveals no gallop.   Pulmonary/Chest: Effort normal and breath sounds normal. No respiratory distress. She has no wheezes.  Abdominal: Soft. Bowel sounds are normal. She exhibits no distension and no mass. There is no tenderness.  Genitourinary: No breast swelling, tenderness, discharge or bleeding.       Breast exam: No mass, nodules, thickening, tenderness, bulging, retraction, inflamation, nipple discharge or skin changes noted.  No axillary or clavicular LA.  Chaperoned exam.    Musculoskeletal: Normal range of motion. She exhibits no edema and no tenderness.  Lymphadenopathy:    She has no cervical adenopathy.  Neurological: She is alert. She has normal reflexes. No cranial nerve deficit. She exhibits normal muscle tone. Coordination normal.  Skin: Skin is warm and dry. No rash noted. No erythema. No pallor.  Psychiatric: She has a normal mood and affect.          Assessment & Plan:

## 2011-09-21 ENCOUNTER — Encounter: Payer: Self-pay | Admitting: Family Medicine

## 2011-10-13 ENCOUNTER — Other Ambulatory Visit: Payer: Self-pay | Admitting: Family Medicine

## 2011-10-16 NOTE — Telephone Encounter (Signed)
Done

## 2011-10-22 DIAGNOSIS — Z0279 Encounter for issue of other medical certificate: Secondary | ICD-10-CM

## 2011-10-26 ENCOUNTER — Other Ambulatory Visit: Payer: Self-pay | Admitting: Family Medicine

## 2012-02-07 ENCOUNTER — Other Ambulatory Visit: Payer: Self-pay | Admitting: Family Medicine

## 2012-02-07 DIAGNOSIS — Z1231 Encounter for screening mammogram for malignant neoplasm of breast: Secondary | ICD-10-CM

## 2012-03-10 ENCOUNTER — Ambulatory Visit
Admission: RE | Admit: 2012-03-10 | Discharge: 2012-03-10 | Disposition: A | Discharge status: Home / Self Care | Payer: 59 | Source: Ambulatory Visit | Attending: Family Medicine | Admitting: Family Medicine

## 2012-03-10 DIAGNOSIS — Z1231 Encounter for screening mammogram for malignant neoplasm of breast: Secondary | ICD-10-CM

## 2012-03-14 ENCOUNTER — Encounter: Payer: Self-pay | Admitting: *Deleted

## 2012-03-22 ENCOUNTER — Other Ambulatory Visit: Payer: Self-pay | Admitting: Family Medicine

## 2012-03-23 NOTE — Telephone Encounter (Signed)
To PCP

## 2012-03-24 NOTE — Telephone Encounter (Signed)
She can have 1 year of refils on that

## 2012-05-09 ENCOUNTER — Ambulatory Visit: Payer: 59 | Admitting: Family Medicine

## 2012-05-16 ENCOUNTER — Ambulatory Visit (INDEPENDENT_AMBULATORY_CARE_PROVIDER_SITE_OTHER): Payer: 59 | Admitting: Family Medicine

## 2012-05-16 ENCOUNTER — Encounter: Payer: Self-pay | Admitting: Family Medicine

## 2012-05-16 VITALS — BP 128/78 | HR 73 | Temp 97.8°F | Ht 65.5 in | Wt 183.5 lb

## 2012-05-16 DIAGNOSIS — J019 Acute sinusitis, unspecified: Secondary | ICD-10-CM

## 2012-05-16 MED ORDER — LEVOFLOXACIN 500 MG PO TABS: 500.0000 mg | ORAL_TABLET | Freq: Every day | ORAL | Status: DC

## 2012-05-16 MED ORDER — HYDROCOD POLST-CHLORPHEN POLST 10-8 MG/5ML PO LQCR: 5.0000 mL | Freq: Two times a day (BID) | ORAL | Status: DC | PRN

## 2012-05-16 NOTE — Progress Notes (Signed)
Subjective:    Patient ID: Amy Mills, female    DOB: [birth year], 62 y.o.   MRN: [id number]  HPI Here with uri symptoms  2 weeks -  Cannot get over it  Felt better and then worse again  Cough is horrible - all day and then worse at night , some production , no wheeze  Sinus congestion and blowing out pussy mucous - with color to it , and her face is hurting under the R eye  No fever no aches   Ears are congested No ST   Patient Active Problem List  Diagnosis  . HYPERCHOLESTEROLEMIA  . STRESS REACTION, ACUTE, WITH EMOTIONAL DISTURBANCE  . RETRACTION/LAG, EYELID  . HYPERTENSION  . UTI'S, RECURRENT  . FIBROMYALGIA  . PARESTHESIA  . MRI, BRAIN, ABNORMAL  . Sinusitis acute  . Vertigo  . Bronchitis  . Routine general medical examination at a health care facility   Past Medical History  Diagnosis Date  . Hypertension   . HLD (hyperlipidemia)   . Back pain   . Fibromyalgia   . History of recurrent UTIs   . Anxiety reaction    No past surgical history on file. History  Substance Use Topics  . Smoking status: Never Smoker   . Smokeless tobacco: Not on file  . Alcohol Use: Yes     Comment: rare   Family History  Problem Relation Age of Onset  . Thyroid nodules Mother   . Heart disease Mother   . Hypertension Father   . Dementia Father   . Alcohol abuse Brother   . Hyperlipidemia Daughter   . Hypertension Daughter   . Cancer Maternal Aunt     breast  . Cancer Paternal Aunt     breast   Allergies  Allergen Reactions  . Amoxicillin-Pot Clavulanate Diarrhea    Diarrhea   . Nitrofurantoin     REACTION: hives   Current Outpatient Prescriptions on File Prior to Visit  Medication Sig Dispense Refill  . atenolol (TENORMIN) 25 MG tablet TAKE 1 TABLET BY MOUTH DAILY  90 tablet  3  . CRESTOR 5 MG tablet TAKE 1 TABLET BY MOUTH DAILY  90 tablet  3  . Diclofenac Sodium CR 100 MG 24 hr tablet TAKE 1 TABLET BY MOUTH DAILY WITH FOOD  90 tablet  1  . fish oil-omega-3  fatty acids 1000 MG capsule Take 1 g by mouth daily.        . Multiple Vitamins-Minerals (WOMENS MULTI PO) Take 1 tablet by mouth daily.        Marland Kitchen omeprazole (PRILOSEC) 20 MG capsule Take 20 mg by mouth daily.        . solifenacin (VESICARE) 10 MG tablet Take 10 mg by mouth daily.       . SUMAtriptan (IMITREX) 50 MG tablet Take 1 tablet (50 mg total) by mouth every 2 (two) hours as needed for migraine (maximum 2 doses per day for migraine).  27 tablet  3  . trimethoprim (TRIMPEX) 100 MG tablet Take 100 mg by mouth daily.             Review of Systems Review of Systems  Constitutional: Negative for fever, appetite change,  and unexpected weight change.  ENT pos for cong and rhinorrhea and sinus pain, neg for ear pain or drainage Eyes: Negative for pain and visual disturbance.  Respiratory: Negative for wheeze and shortness of breath.   Cardiovascular: Negative for cp or palpitations    Gastrointestinal:  Negative for nausea, diarrhea and constipation.  Genitourinary: Negative for urgency and frequency.  Skin: Negative for pallor or rash   Neurological: Negative for weakness, light-headedness, numbness and headaches.  Hematological: Negative for adenopathy. Does not bruise/bleed easily.  Psychiatric/Behavioral: Negative for dysphoric mood. The patient is not nervous/anxious.         Objective:   Physical Exam  Constitutional: She appears well-developed and well-nourished. No distress.  HENT:  Head: Normocephalic and atraumatic.  Right Ear: External ear normal.  Left Ear: External ear normal.  Mouth/Throat: Oropharynx is clear and moist. No oropharyngeal exudate.       Nares are injected and congested  R maxillary sinus is tender    Eyes: Conjunctivae normal and EOM are normal. Pupils are equal, round, and reactive to light. Right eye exhibits no discharge. Left eye exhibits no discharge.  Neck: Normal range of motion. Neck supple.  Cardiovascular: Normal rate and regular rhythm.     Pulmonary/Chest: Effort normal and breath sounds normal. No respiratory distress. She has no wheezes. She has no rales.  Lymphadenopathy:    She has no cervical adenopathy.  Neurological: She is alert. No cranial nerve deficit.  Skin: Skin is warm and dry. No rash noted.  Psychiatric: She has a normal mood and affect.          Assessment & Plan:

## 2012-05-16 NOTE — Patient Instructions (Addendum)
I think you are developing a bacterial sinus infection  Drink lots of fluids  Try the tussionex with caution for cough  Take the levaquin as directed  mucinex is ok to loosen congestion

## 2012-05-16 NOTE — Assessment & Plan Note (Signed)
After over 2 weeks of a uri (sinus pain in R cheek) tx with levaquin tussionex for cough  Disc symptomatic care - see instructions on AVS  Update if not starting to improve in a week or if worsening

## 2012-06-03 ENCOUNTER — Other Ambulatory Visit: Payer: Self-pay | Admitting: *Deleted

## 2012-06-03 MED ORDER — DICLOFENAC SODIUM ER 100 MG PO TB24: 100.0000 mg | ORAL_TABLET | Freq: Every day | ORAL | Status: DC

## 2012-07-11 ENCOUNTER — Other Ambulatory Visit: Payer: Self-pay | Admitting: Family Medicine

## 2012-07-11 NOTE — Telephone Encounter (Signed)
To PCP

## 2012-07-13 NOTE — Telephone Encounter (Signed)
Will refill electronically  

## 2012-09-04 ENCOUNTER — Telehealth: Payer: Self-pay

## 2012-09-04 NOTE — Telephone Encounter (Signed)
Pt left v/m has CPX scheduled 09/15/12 at 2:30 pm. Pt had appt 09/08/12 for CPX labs at [facility] lab; pt cancelled and request lab order faxed to [phone].

## 2012-09-05 NOTE — Telephone Encounter (Signed)
Orders faxed and pt notified

## 2012-09-05 NOTE — Telephone Encounter (Signed)
Order done and in IN box  

## 2012-09-07 ENCOUNTER — Telehealth: Payer: Self-pay | Admitting: Family Medicine

## 2012-09-07 DIAGNOSIS — Z0000 Encounter for general adult medical examination without abnormal findings: Secondary | ICD-10-CM

## 2012-09-07 DIAGNOSIS — E7800 Pure hypercholesterolemia, unspecified: Secondary | ICD-10-CM

## 2012-09-07 NOTE — Telephone Encounter (Signed)
Message copied by Judy Pimple on Sun Sep 07, 2012  5:43 PM ------      Message from: Baldomero Lamy      Created: Tue Aug 26, 2012 12:36 PM      Regarding: Cpx labs 5/5 Mon       Please order  future cpx labs for pt's upcoming lab appt.      Thanks      Tasha       ------

## 2012-09-08 ENCOUNTER — Other Ambulatory Visit: Payer: 59

## 2012-09-10 ENCOUNTER — Other Ambulatory Visit: Payer: Self-pay | Admitting: Family Medicine

## 2012-09-10 LAB — CBC WITH DIFFERENTIAL/PLATELET
Basophils Absolute: 0 10*3/uL (ref 0.0–0.1)
Basophils Relative: 0 % (ref 0–1)
Eosinophils Absolute: 0.4 10*3/uL (ref 0.0–0.7)
Hemoglobin: 13.8 g/dL (ref 12.0–15.0)
MCH: 30.2 pg (ref 26.0–34.0)
MCHC: 34.6 g/dL (ref 30.0–36.0)
Monocytes Absolute: 0.4 10*3/uL (ref 0.1–1.0)
Monocytes Relative: 6 % (ref 3–12)
Neutrophils Relative %: 61 % (ref 43–77)
RDW: 13.4 % (ref 11.5–15.5)

## 2012-09-10 LAB — LIPID PANEL
HDL: 41 mg/dL (ref >39)
LDL Cholesterol: 96 mg/dL (ref 0–99)
Triglycerides: 133 mg/dL (ref <150)
VLDL: 27 mg/dL (ref 0–40)

## 2012-09-10 LAB — COMPREHENSIVE METABOLIC PANEL
AST: 21 U/L (ref 0–37)
Alkaline Phosphatase: 67 U/L (ref 39–117)
BUN: 18 mg/dL (ref 6–23)
Glucose, Bld: 97 mg/dL (ref 70–99)
Sodium: 142 mEq/L (ref 135–145)
Total Bilirubin: 0.4 mg/dL (ref 0.3–1.2)

## 2012-09-15 ENCOUNTER — Encounter: Payer: 59 | Admitting: Family Medicine

## 2012-09-15 ENCOUNTER — Telehealth: Payer: Self-pay | Admitting: Family Medicine

## 2012-09-15 NOTE — Telephone Encounter (Signed)
That is fine-any 30 min slot is ok

## 2012-09-15 NOTE — Telephone Encounter (Signed)
Scheduled for Friday, 10/10/2012

## 2012-09-15 NOTE — Telephone Encounter (Signed)
Pt called in and is short staffed at work so she couldn't make her apptmt for a CPE today.  She wants to know if there's any way you can fit her in during the week of June 3rd-6th. Can you accommodate her a CPE during these dates? Thank you.

## 2012-10-10 ENCOUNTER — Encounter: Payer: Self-pay | Admitting: Family Medicine

## 2012-10-10 ENCOUNTER — Ambulatory Visit (INDEPENDENT_AMBULATORY_CARE_PROVIDER_SITE_OTHER): Payer: 59 | Admitting: Family Medicine

## 2012-10-10 VITALS — BP 122/74 | HR 75 | Temp 97.7°F | Ht 63.75 in | Wt 174.5 lb

## 2012-10-10 DIAGNOSIS — I10 Essential (primary) hypertension: Secondary | ICD-10-CM

## 2012-10-10 DIAGNOSIS — Z0000 Encounter for general adult medical examination without abnormal findings: Secondary | ICD-10-CM

## 2012-10-10 DIAGNOSIS — E7800 Pure hypercholesterolemia, unspecified: Secondary | ICD-10-CM

## 2012-10-10 MED ORDER — DICLOFENAC SODIUM ER 100 MG PO TB24: 100.0000 mg | ORAL_TABLET | Freq: Every day | ORAL | Status: DC

## 2012-10-10 NOTE — Assessment & Plan Note (Signed)
bp in fair control at this time  No changes needed  Disc lifstyle change with low sodium diet and exercise   

## 2012-10-10 NOTE — Assessment & Plan Note (Signed)
Reviewed health habits including diet and exercise and skin cancer prevention Also reviewed health mt list, fam hx and immunizations   Labs reviewed  

## 2012-10-10 NOTE — Progress Notes (Signed)
Subjective:    Patient ID: Amy Mills, female    DOB: [birth year], 62 y.o.   MRN: [id number]  HPI Here for health maintenance exam and to review chronic medical problems    Is doing ok - no new problems   Wt is down 9 lb with bmi of 30  Zoster status-has not had the vaccine-not interested yet  Flu shot- did not get  Td 07  mammo 11/13 nl  Self exam-no lumps or changes  Had hysterectomy- no gyn symptoms at all   colonosc 1/12 ok - 10 year recall   dexa 09 normal    Hyperlipidemia Lab Results  Component Value Date   CHOL 164 09/10/2012   CHOL 169 09/24/2007   CHOL 198 02/11/2007   Lab Results  Component Value Date   HDL 41 09/10/2012   HDL 36.9* 09/24/2007   HDL 40.9 02/11/2007   Lab Results  Component Value Date   LDLCALC 96 09/10/2012   LDLCALC 103* 09/24/2007   LDLCALC 121* 02/11/2007   Lab Results  Component Value Date   TRIG 133 09/10/2012   TRIG 148 09/24/2007   TRIG 178* 02/11/2007   Lab Results  Component Value Date   CHOLHDL 4.0 09/10/2012   CHOLHDL 4.6 CALC 09/24/2007   CHOLHDL 4.8 CALC 02/11/2007   Lab Results  Component Value Date   LDLDIRECT 212.8 07/02/2006   has been using essential oils to help her weight and also cholesterol - is happy about that  Also eats a pretty healthy diet   Other labs ok   bp is stable today  No cp or palpitations or headaches or edema  No side effects to medicines  BP Readings from Last 3 Encounters:  10/10/12 122/74  05/16/12 128/78  09/14/11 102/60      Patient Active Problem List   Diagnosis Date Noted  . Routine general medical examination at a health care facility 09/14/2011  . Vertigo 01/10/2011  . STRESS REACTION, ACUTE, WITH EMOTIONAL DISTURBANCE 05/12/2008  . HYPERCHOLESTEROLEMIA 04/21/2007  . HYPERTENSION 04/21/2007  . UTI'S, RECURRENT 04/21/2007  . FIBROMYALGIA 04/21/2007  . RETRACTION/LAG, EYELID 09/20/2006  . PARESTHESIA 09/20/2006  . MRI, BRAIN, ABNORMAL 09/20/2006   Past Medical History  Diagnosis  Date  . Hypertension   . HLD (hyperlipidemia)   . Back pain   . Fibromyalgia   . History of recurrent UTIs   . Anxiety reaction    No past surgical history on file. History  Substance Use Topics  . Smoking status: Never Smoker   . Smokeless tobacco: Not on file  . Alcohol Use: Yes     Comment: rare   Family History  Problem Relation Age of Onset  . Thyroid nodules Mother   . Heart disease Mother   . Hypertension Father   . Dementia Father   . Alcohol abuse Brother   . Hyperlipidemia Daughter   . Hypertension Daughter   . Cancer Maternal Aunt     breast  . Cancer Paternal Aunt     breast   Allergies  Allergen Reactions  . Amoxicillin-Pot Clavulanate Diarrhea    Diarrhea   . Nitrofurantoin     REACTION: hives   Current Outpatient Prescriptions on File Prior to Visit  Medication Sig Dispense Refill  . atenolol (TENORMIN) 25 MG tablet TAKE 1 TABLET BY MOUTH DAILY  90 tablet  3  . CRESTOR 5 MG tablet TAKE 1 TABLET BY MOUTH DAILY  90 tablet  3  . Diclofenac Sodium  CR 100 MG 24 hr tablet Take 1 tablet (100 mg total) by mouth daily.  90 tablet  1  . fish oil-omega-3 fatty acids 1000 MG capsule Take 1 g by mouth daily.        . Multiple Vitamins-Minerals (WOMENS MULTI PO) Take 1 tablet by mouth daily.        Marland Kitchen omeprazole (PRILOSEC) 20 MG capsule Take 20 mg by mouth daily.        . solifenacin (VESICARE) 10 MG tablet Take 10 mg by mouth daily.       . SUMAtriptan (IMITREX) 50 MG tablet Take 1 tablet (50 mg total) by mouth every 2 (two) hours as needed for migraine (maximum 2 doses per day for migraine).  27 tablet  3  . trimethoprim (TRIMPEX) 100 MG tablet Take 100 mg by mouth daily.        No current facility-administered medications on file prior to visit.    Review of Systems Review of Systems  Constitutional: Negative for fever, appetite change, fatigue and unexpected weight change.  Eyes: Negative for pain and visual disturbance.  Respiratory: Negative for cough and  shortness of breath.   Cardiovascular: Negative for cp or palpitations    Gastrointestinal: Negative for nausea, diarrhea and constipation.  Genitourinary: Negative for urgency and frequency.  Skin: Negative for pallor or rash   Neurological: Negative for weakness, light-headedness, numbness and headaches.  Hematological: Negative for adenopathy. Does not bruise/bleed easily.  Psychiatric/Behavioral: Negative for dysphoric mood. The patient is not nervous/anxious.         Objective:   Physical Exam  Constitutional: She appears well-developed and well-nourished. No distress.  obese and well appearing   HENT:  Head: Normocephalic and atraumatic.  Right Ear: External ear normal.  Left Ear: External ear normal.  Nose: Nose normal.  Mouth/Throat: Oropharynx is clear and moist.  Eyes: Conjunctivae and EOM are normal. Pupils are equal, round, and reactive to light. Right eye exhibits no discharge. Left eye exhibits no discharge. No scleral icterus.  Neck: Normal range of motion. Neck supple. No JVD present. Carotid bruit is not present. No thyromegaly present.  Cardiovascular: Normal rate, regular rhythm, normal heart sounds and intact distal pulses.  Exam reveals no gallop.   Pulmonary/Chest: Effort normal and breath sounds normal. No respiratory distress. She has no wheezes. She has no rales.  Abdominal: Soft. Bowel sounds are normal. She exhibits no distension, no abdominal bruit and no mass. There is no tenderness.  Genitourinary: No breast swelling, tenderness, discharge or bleeding.  Breast exam: No mass, nodules, thickening, tenderness, bulging, retraction, inflamation, nipple discharge or skin changes noted.  No axillary or clavicular LA.  Chaperoned exam.    Musculoskeletal: Normal range of motion. She exhibits no edema and no tenderness.  Lymphadenopathy:    She has no cervical adenopathy.  Neurological: She is alert. She has normal reflexes. No cranial nerve deficit. She exhibits  normal muscle tone. Coordination normal.  Skin: Skin is warm and dry. No rash noted. No erythema. No pallor.  Psychiatric: She has a normal mood and affect.          Assessment & Plan:

## 2012-10-10 NOTE — Assessment & Plan Note (Signed)
Disc goals for lipids and reasons to control them Rev labs with pt Rev low sat fat diet in detail Statin and diet-continue

## 2012-10-10 NOTE — Patient Instructions (Addendum)
If you are interested in a shingles/zoster vaccine in the future  - call your insurance to check on coverage,( you should not get it within 1 month of other vaccines) , then call us for a prescription  for it to take to a pharmacy that gives the shot , or make a nurse visit to get it here depending on your coverage Take care of yourself  Eat healthy and keep exercising

## 2012-11-10 ENCOUNTER — Telehealth: Payer: Self-pay

## 2012-11-10 NOTE — Telephone Encounter (Signed)
Pt request generic Crestor; spoke with pharmacist to verify Crestor is not in generic form yet. Pt will keep checking.

## 2013-01-24 ENCOUNTER — Encounter (HOSPITAL_COMMUNITY): Payer: Self-pay | Admitting: Vascular Surgery

## 2013-01-24 ENCOUNTER — Emergency Department (HOSPITAL_COMMUNITY): Payer: 59

## 2013-01-24 ENCOUNTER — Emergency Department (HOSPITAL_COMMUNITY)
Admission: EM | Admit: 2013-01-24 | Discharge: 2013-01-24 | Disposition: A | Discharge status: Home / Self Care | Payer: 59 | Attending: Emergency Medicine | Admitting: Emergency Medicine

## 2013-01-24 DIAGNOSIS — S93402A Sprain of unspecified ligament of left ankle, initial encounter: Secondary | ICD-10-CM

## 2013-01-24 DIAGNOSIS — I10 Essential (primary) hypertension: Secondary | ICD-10-CM | POA: Insufficient documentation

## 2013-01-24 DIAGNOSIS — Y929 Unspecified place or not applicable: Secondary | ICD-10-CM | POA: Insufficient documentation

## 2013-01-24 DIAGNOSIS — Z8639 Personal history of other endocrine, nutritional and metabolic disease: Secondary | ICD-10-CM | POA: Insufficient documentation

## 2013-01-24 DIAGNOSIS — S93409A Sprain of unspecified ligament of unspecified ankle, initial encounter: Secondary | ICD-10-CM | POA: Insufficient documentation

## 2013-01-24 DIAGNOSIS — Z79899 Other long term (current) drug therapy: Secondary | ICD-10-CM | POA: Insufficient documentation

## 2013-01-24 DIAGNOSIS — Y939 Activity, unspecified: Secondary | ICD-10-CM | POA: Insufficient documentation

## 2013-01-24 DIAGNOSIS — Z862 Personal history of diseases of the blood and blood-forming organs and certain disorders involving the immune mechanism: Secondary | ICD-10-CM | POA: Insufficient documentation

## 2013-01-24 DIAGNOSIS — Z8659 Personal history of other mental and behavioral disorders: Secondary | ICD-10-CM | POA: Insufficient documentation

## 2013-01-24 DIAGNOSIS — X500XXA Overexertion from strenuous movement or load, initial encounter: Secondary | ICD-10-CM | POA: Insufficient documentation

## 2013-01-24 DIAGNOSIS — Z87440 Personal history of urinary (tract) infections: Secondary | ICD-10-CM | POA: Insufficient documentation

## 2013-01-24 MED ORDER — NAPROXEN 500 MG PO TABS: 500.0000 mg | ORAL_TABLET | Freq: Two times a day (BID) | ORAL | Status: DC

## 2013-01-24 NOTE — ED Provider Notes (Signed)
CSN: [id number]     Arrival date & time 01/24/13  1400 History  This chart was scribed for non-physician practitioner Rhea Bleacher PA-C working with Roney Marion, MD by Leone Payor, ED Scribe. This patient was seen in room TR06C/TR06C and the patient's care was started at 1400.    Chief Complaint  Patient presents with  . Ankle Pain    The history is provided by the patient. No language interpreter was used.    HPI Comments: Amy Mills is a 62 y.o. female who presents to the Emergency Department complaining of sudden onset, constant left ankle pain that began 1 hour ago after a fall. She states she twisted her left ankle and fell, striking her right knee on the ground. She has associated swelling to the left ankle. She has not taken any OTC medications. She is able to bear weight but is painful. She denies any pain or bruising to the right knee. She denies numbness or weakness.   Past Medical History  Diagnosis Date  . Hypertension   . HLD (hyperlipidemia)   . Back pain   . Fibromyalgia   . History of recurrent UTIs   . Anxiety reaction    No past surgical history on file. Family History  Problem Relation Age of Onset  . Thyroid nodules Mother   . Heart disease Mother   . Hypertension Father   . Dementia Father   . Alcohol abuse Brother   . Hyperlipidemia Daughter   . Hypertension Daughter   . Cancer Maternal Aunt     breast  . Cancer Paternal Aunt     breast   History  Substance Use Topics  . Smoking status: Never Smoker   . Smokeless tobacco: Not on file  . Alcohol Use: Yes     Comment: rare   OB History   Grav Para Term Preterm Abortions TAB SAB Ect Mult Living                 Review of Systems  Constitutional: Negative for activity change.  HENT: Negative for neck pain.   Musculoskeletal: Positive for arthralgias (left ankle pain). Negative for back pain and joint swelling.  Skin: Negative for wound.  Neurological: Negative for weakness and numbness.     Allergies  Amoxicillin-pot clavulanate and Nitrofurantoin  Home Medications   Current Outpatient Rx  Name  Route  Sig  Dispense  Refill  . atenolol (TENORMIN) 25 MG tablet      TAKE 1 TABLET BY MOUTH DAILY   90 tablet   3   . CRESTOR 5 MG tablet      TAKE 1 TABLET BY MOUTH DAILY   90 tablet   3   . Diclofenac Sodium CR 100 MG 24 hr tablet   Oral   Take 1 tablet (100 mg total) by mouth daily.   90 tablet   3   . fish oil-omega-3 fatty acids 1000 MG capsule   Oral   Take 1 g by mouth daily.           . Multiple Vitamins-Minerals (WOMENS MULTI PO)   Oral   Take 1 tablet by mouth daily.           Marland Kitchen omeprazole (PRILOSEC) 20 MG capsule   Oral   Take 20 mg by mouth daily.           . solifenacin (VESICARE) 10 MG tablet   Oral   Take 10 mg by mouth daily.          Marland Kitchen  SUMAtriptan (IMITREX) 50 MG tablet   Oral   Take 1 tablet (50 mg total) by mouth every 2 (two) hours as needed for migraine (maximum 2 doses per day for migraine).   27 tablet   3   . trimethoprim (TRIMPEX) 100 MG tablet   Oral   Take 100 mg by mouth daily.           LMP 05/08/1995 Physical Exam  Nursing note and vitals reviewed. Constitutional: She appears well-developed and well-nourished. No distress.  HENT:  Head: Normocephalic and atraumatic.  Eyes: Conjunctivae and EOM are normal.  Neck: Normal range of motion. Neck supple.  Cardiovascular: Normal rate and intact distal pulses.   Pulses:      Dorsalis pedis pulses are 2+ on the right side, and 2+ on the left side.       Posterior tibial pulses are 2+ on the right side, and 2+ on the left side.  Pulses intact.   Pulmonary/Chest: Effort normal. No stridor.  Musculoskeletal: Normal range of motion. She exhibits edema and tenderness.       Right knee: She exhibits no ecchymosis. No tenderness found.       Left ankle: She exhibits swelling. She exhibits normal pulse. Tenderness. Lateral malleolus tenderness found. No proximal  fibula tenderness found.  Swelling and tenderness to the lateral malleolus.   Neurological: She is alert. She has normal strength. No sensory deficit.  Strength and sensation normal.   Skin: Skin is warm and dry.  Cap refill normal.   Psychiatric: She has a normal mood and affect.    ED Course  Procedures   DIAGNOSTIC STUDIES: Oxygen Saturation is 96% on RA, adequate by my interpretation.    COORDINATION OF CARE: 2:07 PM Will order XRAY of the left ankle. Discussed treatment plan with pt at bedside and pt agreed to plan.   Labs Review Labs Reviewed - No data to display Imaging Review Dg Ankle Complete Left  01/24/2013   CLINICAL DATA:  Rolled left ankle while walking. Pain  EXAM: LEFT ANKLE COMPLETE - 3+ VIEW  COMPARISON:  None.  FINDINGS: There is no evidence of fracture, dislocation, or joint effusion. There is no evidence of arthropathy or other focal bone abnormality. Moderate lateral soft tissue swelling noted. Marland Kitchen  IMPRESSION: 1. No acute bone abnormality.  2. Soft tissue swelling.   Electronically Signed   By: Signa Kell M.D.   On: 01/24/2013 14:58   Vital signs reviewed and are as follows: Filed Vitals:   01/24/13 1521  BP: 129/82  Pulse: 96  Temp: 97 F (36.1 C)  Resp: 18    3:23 PM ASO by nurse. Patient was counseled on RICE protocol and told to rest injury, use ice for no longer than 15 minutes every hour, compress the area, and elevate above the level of their heart as much as possible to reduce swelling.  Questions answered.  Patient verbalized understanding.    Urged to followup with orthopedic referral if not improved in one week.   MDM   1. Ankle sprain, left, initial encounter    Patient with ankle injury. Negative x-ray. Lower extremity is neurovascularly intact. No knee injury. No signs of compartment syndrome. Conservative management indicated with orthopedic followup if not improved in one week.  I personally performed the services described in this  documentation, which was scribed in my presence. The recorded information has been reviewed and is accurate.   Renne Crigler, PA-C 01/24/13 1524

## 2013-01-24 NOTE — ED Notes (Signed)
Pt reports to the ED for eval of left ankle pain following a fall today. Pt reports she twisted her left ankle and hit her right knee. Minimal swelling noted to left ankle. No obvious deformity or ecchymosis noted to left ankle. No ecchymosis, swelling, or deformity noted to right knee. CMS and full ROM intact. Pt denies syncope or head injury. Pt A&O x4 and NAD noted.

## 2013-01-25 NOTE — ED Provider Notes (Signed)
Medical screening examination/treatment/procedure(s) were performed by non-physician practitioner and as supervising physician I was immediately available for consultation/collaboration.   Danelia Snodgrass J Aking Klabunde, MD 01/25/13 0913 

## 2013-02-16 ENCOUNTER — Other Ambulatory Visit: Payer: Self-pay

## 2013-02-16 DIAGNOSIS — Z1231 Encounter for screening mammogram for malignant neoplasm of breast: Secondary | ICD-10-CM

## 2013-03-16 ENCOUNTER — Ambulatory Visit
Admission: RE | Admit: 2013-03-16 | Discharge: 2013-03-16 | Disposition: A | Discharge status: Home / Self Care | Payer: 59 | Source: Ambulatory Visit

## 2013-03-16 DIAGNOSIS — Z1231 Encounter for screening mammogram for malignant neoplasm of breast: Secondary | ICD-10-CM

## 2013-03-23 ENCOUNTER — Encounter: Payer: Self-pay | Admitting: *Deleted

## 2013-10-07 ENCOUNTER — Telehealth: Payer: Self-pay | Admitting: Family Medicine

## 2013-10-07 DIAGNOSIS — Z0000 Encounter for general adult medical examination without abnormal findings: Secondary | ICD-10-CM

## 2013-10-07 NOTE — Telephone Encounter (Signed)
Message copied by Judy Pimple on Wed Oct 07, 2013 10:09 PM ------      Message from: Baldomero Lamy      Created: Mon Oct 05, 2013 10:08 AM      Regarding: [facility] labs 10/08/13       Please order  future cpx labs for pt's upcoming lab appt.      Thanks      Tasha       ------

## 2013-10-08 ENCOUNTER — Other Ambulatory Visit (INDEPENDENT_AMBULATORY_CARE_PROVIDER_SITE_OTHER): Payer: 59

## 2013-10-08 DIAGNOSIS — Z0000 Encounter for general adult medical examination without abnormal findings: Secondary | ICD-10-CM

## 2013-10-08 LAB — CBC WITH DIFFERENTIAL/PLATELET
BASOS ABS: 0 10*3/uL (ref 0.0–0.1)
Basophils Relative: 0.5 % (ref 0.0–3.0)
EOS ABS: 0.3 10*3/uL (ref 0.0–0.7)
Eosinophils Relative: 4.2 % (ref 0.0–5.0)
HEMATOCRIT: 44 % (ref 36.0–46.0)
HEMOGLOBIN: 14.6 g/dL (ref 12.0–15.0)
LYMPHS PCT: 27 % (ref 12.0–46.0)
Lymphs Abs: 2 10*3/uL (ref 0.7–4.0)
MCHC: 33.1 g/dL (ref 30.0–36.0)
MCV: 91.5 fl (ref 78.0–100.0)
Monocytes Absolute: 0.5 10*3/uL (ref 0.1–1.0)
Monocytes Relative: 7.2 % (ref 3.0–12.0)
NEUTROS PCT: 61.1 % (ref 43.0–77.0)
Neutro Abs: 4.6 10*3/uL (ref 1.4–7.7)
PLATELETS: 258 10*3/uL (ref 150.0–400.0)
RBC: 4.8 Mil/uL (ref 3.87–5.11)
RDW: 12.9 % (ref 11.5–15.5)
WBC: 7.5 10*3/uL (ref 4.0–10.5)

## 2013-10-08 LAB — LIPID PANEL
CHOL/HDL RATIO: 6
Cholesterol: 259 mg/dL — ABNORMAL HIGH (ref 0–200)
HDL: 41.7 mg/dL (ref >39.00)
LDL Cholesterol: 170 mg/dL — ABNORMAL HIGH (ref 0–99)
NONHDL: 217.3
Triglycerides: 236 mg/dL — ABNORMAL HIGH (ref 0.0–149.0)
VLDL: 47.2 mg/dL — ABNORMAL HIGH (ref 0.0–40.0)

## 2013-10-08 LAB — COMPREHENSIVE METABOLIC PANEL
ALT: 32 U/L (ref 0–35)
AST: 26 U/L (ref 0–37)
Albumin: 4.3 g/dL (ref 3.5–5.2)
Alkaline Phosphatase: 67 U/L (ref 39–117)
BUN: 20 mg/dL (ref 6–23)
CALCIUM: 9.3 mg/dL (ref 8.4–10.5)
CHLORIDE: 108 meq/L (ref 96–112)
CO2: 28 mEq/L (ref 19–32)
CREATININE: 0.8 mg/dL (ref 0.4–1.2)
GFR: 78.21 mL/min (ref >60.00)
Glucose, Bld: 99 mg/dL (ref 70–99)
POTASSIUM: 4.3 meq/L (ref 3.5–5.1)
SODIUM: 142 meq/L (ref 135–145)
Total Bilirubin: 0.4 mg/dL (ref 0.2–1.2)
Total Protein: 7 g/dL (ref 6.0–8.3)

## 2013-10-08 LAB — TSH: TSH: 0.51 u[IU]/mL (ref 0.35–4.50)

## 2013-10-12 ENCOUNTER — Encounter: Payer: Self-pay | Admitting: Family Medicine

## 2013-10-12 ENCOUNTER — Ambulatory Visit (INDEPENDENT_AMBULATORY_CARE_PROVIDER_SITE_OTHER): Payer: 59 | Admitting: Family Medicine

## 2013-10-12 VITALS — BP 142/84 | HR 77 | Temp 97.2°F | Ht 63.25 in | Wt 176.8 lb

## 2013-10-12 DIAGNOSIS — I10 Essential (primary) hypertension: Secondary | ICD-10-CM

## 2013-10-12 DIAGNOSIS — E7800 Pure hypercholesterolemia, unspecified: Secondary | ICD-10-CM

## 2013-10-12 DIAGNOSIS — Z0000 Encounter for general adult medical examination without abnormal findings: Secondary | ICD-10-CM

## 2013-10-12 MED ORDER — ROSUVASTATIN CALCIUM 5 MG PO TABS: 5.0000 mg | ORAL_TABLET | Freq: Every day | ORAL | Status: DC

## 2013-10-12 MED ORDER — SUMATRIPTAN SUCCINATE 50 MG PO TABS: 50.0000 mg | ORAL_TABLET | ORAL | Status: DC | PRN

## 2013-10-12 MED ORDER — ATENOLOL 25 MG PO TABS: ORAL_TABLET | ORAL | Status: DC

## 2013-10-12 NOTE — Assessment & Plan Note (Signed)
Reviewed health habits including diet and exercise and skin cancer prevention Reviewed appropriate screening tests for age  Also reviewed health mt list, fam hx and immunization status , as well as social and family history   See HPI Labs reviewed  

## 2013-10-12 NOTE — Progress Notes (Signed)
Pre visit review using our clinic review tool, if applicable. No additional management support is needed unless otherwise documented below in the visit note. 

## 2013-10-12 NOTE — Assessment & Plan Note (Signed)
bp is up here and outside of the office Will begin back on atenolol and check bp at work -update  Rev lifestyle change /DASH diet  Lab rev

## 2013-10-12 NOTE — Progress Notes (Signed)
Subjective:    Patient ID: Amy Mills, female    DOB: [birth year], 63 y.o.   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  Long days      Wt is up 2 lb with bmi of 31  bp is a little higher than usual today  She has checked it at [employer] and it has been higher than usual  She uses essential oils -that helped - but not now  Headaches are coming back (stress)  No cp or palpitations or headaches or edema  No side effects to medicines  BP Readings from Last 3 Encounters:  10/12/13 142/84  01/24/13 129/82  10/10/12 122/74    Was prev on atenolol   Zoster vaccine - not interested   Flu vaccine -does not get , she has "never had the flu"   Mammogram 11/14- no problems  Self exam no lumps   Td 1/07  colonosc 1/12 nl - 10 year recall   dexa nl 5/09  Hyperlipidemia crestor and diet - then stopped her crestor to see what would happen Lab Results  Component Value Date   CHOL 259* 10/08/2013   CHOL 164 09/10/2012   CHOL 169 09/24/2007   Lab Results  Component Value Date   HDL 41.70 10/08/2013   HDL 41 09/10/2012   HDL 36.9* 09/24/2007   Lab Results  Component Value Date   LDLCALC 170* 10/08/2013   LDLCALC 96 09/10/2012   LDLCALC 103* 09/24/2007   Lab Results  Component Value Date   TRIG 236.0* 10/08/2013   TRIG 133 09/10/2012   TRIG 148 09/24/2007   Lab Results  Component Value Date   CHOLHDL 6 10/08/2013   CHOLHDL 4.0 09/10/2012   CHOLHDL 4.6 CALC 09/24/2007   Lab Results  Component Value Date   LDLDIRECT 212.8 07/02/2006   cholesterol is way up - off crestor  Doing better with diet  despite work hours   Lost her dad in Jan to the flu - doing ok with grief   Patient Active Problem List   Diagnosis Date Noted  . Routine general medical examination at a health care facility 09/14/2011  . Vertigo 01/10/2011  . STRESS REACTION, ACUTE, WITH EMOTIONAL DISTURBANCE 05/12/2008  . HYPERCHOLESTEROLEMIA 04/21/2007  . HYPERTENSION 04/21/2007  . UTI'S, RECURRENT 04/21/2007  . FIBROMYALGIA 04/21/2007  . RETRACTION/LAG, EYELID 09/20/2006  . PARESTHESIA 09/20/2006  . MRI, BRAIN, ABNORMAL 09/20/2006   Past Medical History  Diagnosis Date  . Hypertension   . HLD (hyperlipidemia)   . Back pain   . Fibromyalgia   . History of recurrent UTIs   . Anxiety reaction    Past Surgical History  Procedure Laterality Date  . Tonsillectomy    . Ovarian cyst removal    . Abdominal hysterectomy     History  Substance Use Topics  . Smoking status: Never Smoker   . Smokeless tobacco: Not on file  . Alcohol Use: Yes     Comment: rare   Family History  Problem Relation Age of Onset  . Thyroid nodules Mother   .  Heart disease Mother   . Hypertension Father   . Dementia Father   . Alcohol abuse Brother   . Hyperlipidemia Daughter   . Hypertension Daughter   . Cancer Maternal Aunt     breast  . Cancer Paternal Aunt     breast   Allergies  Allergen Reactions  . Amoxicillin-Pot Clavulanate Diarrhea    Diarrhea   . Nitrofurantoin     REACTION: hives   Current Outpatient Prescriptions on File Prior to Visit  Medication Sig Dispense Refill  . Diclofenac Sodium CR 100 MG 24 hr tablet Take 1 tablet (100 mg total) by mouth daily.  90 tablet  3  . fish oil-omega-3 fatty acids 1000 MG capsule Take 1 g by mouth daily.        . Multiple Vitamins-Minerals (WOMENS MULTI PO) Take 1 tablet by mouth daily.        . naproxen (NAPROSYN) 500 MG tablet Take 1 tablet (500 mg total) by mouth 2 (two) times daily.  20 tablet  0  . omeprazole (PRILOSEC) 20 MG capsule Take 20 mg by mouth daily.         . solifenacin (VESICARE) 10 MG tablet Take 10 mg by mouth daily.       . SUMAtriptan (IMITREX) 50 MG tablet Take 1 tablet (50 mg total) by mouth every 2 (two) hours as needed for migraine (maximum 2 doses per day for migraine).  27 tablet  3  . trimethoprim (TRIMPEX) 100 MG tablet Take 100 mg by mouth daily.        No current facility-administered medications on file prior to visit.    Review of Systems Review of Systems  Constitutional: Negative for fever, appetite change,  and unexpected weight change. pos for fatigue  Eyes: Negative for pain and visual disturbance.  Respiratory: Negative for cough and shortness of breath.   Cardiovascular: Negative for cp or palpitations    Gastrointestinal: Negative for nausea, diarrhea and constipation.  Genitourinary: Negative for urgency and frequency.  Skin: Negative for pallor or rash   Neurological: Negative for weakness, light-headedness, numbness and headaches.  Hematological: Negative for adenopathy. Does not bruise/bleed easily.  Psychiatric/Behavioral: Negative for dysphoric mood. The patient is not nervous/anxious.  pos for stressors        Objective:   Physical Exam  Constitutional: She appears well-developed and well-nourished. No distress.  obese and well appearing   HENT:  Head: Normocephalic and atraumatic.  Right Ear: External ear normal.  Left Ear: External ear normal.  Nose: Nose normal.  Mouth/Throat: Oropharynx is clear and moist.  Eyes: Conjunctivae and EOM are normal. Pupils are equal, round, and reactive to light. Right eye exhibits no discharge. Left eye exhibits no discharge. No scleral icterus.  Neck: Normal range of motion. Neck supple. No JVD present. No thyromegaly present.  Cardiovascular: Normal rate, regular rhythm, normal heart sounds and intact distal pulses.  Exam reveals no gallop.   Pulmonary/Chest: Effort normal and breath sounds normal. No respiratory distress. She has no wheezes. She has no rales.    Abdominal: Soft. Bowel sounds are normal. She exhibits no distension and no mass. There is no tenderness.  Genitourinary: No breast swelling, tenderness, discharge or bleeding.  Breast exam: No mass, nodules, thickening, tenderness, bulging, retraction, inflamation, nipple discharge or skin changes noted.  No axillary or clavicular LA.      Musculoskeletal: She exhibits no edema and no tenderness.  No acute joint changes   Lymphadenopathy:  She has no cervical adenopathy.  Neurological: She is alert. She has normal reflexes. No cranial nerve deficit. She exhibits normal muscle tone. Coordination normal.  Skin: Skin is warm and dry. No rash noted. No erythema. No pallor.  Psychiatric: She has a normal mood and affect.          Assessment & Plan:   Problem List Items Addressed This Visit     Cardiovascular and Mediastinum   HYPERTENSION - Primary     bp is up here and outside of the office Will begin back on atenolol and check bp at work -update  Rev lifestyle change /DASH diet  Lab rev     Relevant Medications      rosuvastatin (CRESTOR) tablet      atenolol (TENORMIN) tablet     Other   HYPERCHOLESTEROLEMIA     Lipids are up despite improved diet  Disc goals for lipids and reasons to control them Rev labs with pt Rev low sat fat diet in detail Will begin crestor 5 mg which worked well in the past  Lab in 6-8 weeks     Relevant Medications      rosuvastatin (CRESTOR) tablet      atenolol (TENORMIN) tablet   Routine general medical examination at a health care facility     Reviewed health habits including diet and exercise and skin cancer prevention Reviewed appropriate screening tests for age  Also reviewed health mt list, fam hx and immunization status , as well as social and family history   See HPI Labs reviewed

## 2013-10-12 NOTE — Patient Instructions (Signed)
Start back on atenolol and crestor Schedule fasting lab in about 6-8 weeks for cholesterol  If you are interested in a shingles/zoster vaccine - call your insurance to check on coverage,( you should not get it within 1 month of other vaccines) , then call us for a prescription  for it to take to a pharmacy that gives the shot , or make a nurse visit to get it here depending on your coverage  Try to take care of yourself

## 2013-10-12 NOTE — Assessment & Plan Note (Signed)
Lipids are up despite improved diet  Disc goals for lipids and reasons to control them Rev labs with pt Rev low sat fat diet in detail Will begin crestor 5 mg which worked well in the past  Lab in 6-8 weeks

## 2013-10-13 ENCOUNTER — Telehealth: Payer: Self-pay | Admitting: Family Medicine

## 2013-10-13 NOTE — Telephone Encounter (Signed)
Relevant patient education assigned to patient using Emmi. ° °

## 2013-11-20 ENCOUNTER — Other Ambulatory Visit: Payer: Self-pay | Admitting: Family Medicine

## 2013-11-20 NOTE — Telephone Encounter (Signed)
Please refil times 3 

## 2013-11-20 NOTE — Telephone Encounter (Signed)
done

## 2013-11-20 NOTE — Telephone Encounter (Signed)
Electronic refill request, please advise  

## 2014-02-15 ENCOUNTER — Other Ambulatory Visit: Payer: Self-pay

## 2014-02-15 DIAGNOSIS — Z1231 Encounter for screening mammogram for malignant neoplasm of breast: Secondary | ICD-10-CM

## 2014-03-17 ENCOUNTER — Ambulatory Visit: Payer: 59

## 2014-03-31 ENCOUNTER — Ambulatory Visit
Admission: RE | Admit: 2014-03-31 | Discharge: 2014-03-31 | Disposition: A | Discharge status: Home / Self Care | Payer: 59 | Source: Ambulatory Visit

## 2014-03-31 DIAGNOSIS — Z1231 Encounter for screening mammogram for malignant neoplasm of breast: Secondary | ICD-10-CM

## 2014-04-06 ENCOUNTER — Encounter: Payer: Self-pay | Admitting: *Deleted

## 2014-11-10 ENCOUNTER — Encounter: Payer: Self-pay | Admitting: Family Medicine

## 2014-11-10 ENCOUNTER — Ambulatory Visit (INDEPENDENT_AMBULATORY_CARE_PROVIDER_SITE_OTHER): Payer: 59 | Admitting: Family Medicine

## 2014-11-10 VITALS — BP 142/82 | HR 98 | Temp 98.0°F | Ht 63.25 in | Wt 181.0 lb

## 2014-11-10 DIAGNOSIS — E7800 Pure hypercholesterolemia, unspecified: Secondary | ICD-10-CM

## 2014-11-10 DIAGNOSIS — I10 Essential (primary) hypertension: Secondary | ICD-10-CM

## 2014-11-10 NOTE — Progress Notes (Signed)
Pre visit review using our clinic review tool, if applicable. No additional management support is needed unless otherwise documented below in the visit note. 

## 2014-11-10 NOTE — Progress Notes (Signed)
Subjective:    Patient ID: Amy Mills, female    DOB: January 24, 1951, 64 y.o.   MRN: [id number]  HPI Here for f/u of chronic medical problems   Has been working a lot  husb had knee replacement surgery in June - he is doing better , will have the other one in the fall   bp is stable today  No cp or palpitations or headaches or edema  No side effects to medicines  BP Readings from Last 3 Encounters:  11/10/14 142/82  10/12/13 142/84  01/24/13 129/82    Has checked it at work/ and also at her annual with urology  Was high at urology office 160s/80s  Taking her medicine   Re check 142/80 after sitting   Eating well and working hard at walking and cutting carbs   For cholesterol -started back on crestor 5 mg  Lab Results  Component Value Date   CHOL 259* 10/08/2013   HDL 41.70 10/08/2013   LDLCALC 170* 10/08/2013   LDLDIRECT 212.8 07/02/2006   TRIG 236.0* 10/08/2013   CHOLHDL 6 10/08/2013    Due for labs  Had it done at [facility] - in the fall - per pt cholesterol was great!  Will bring labs that she has   She can get labs every fall at [facility] is up 5 lb with bmi of 31  Patient Active Problem List   Diagnosis Date Noted  . Routine general medical examination at a health care facility 09/14/2011  . STRESS REACTION, ACUTE, WITH EMOTIONAL DISTURBANCE 05/12/2008  . HYPERCHOLESTEROLEMIA 04/21/2007  . Essential hypertension 04/21/2007  . UTI'S, RECURRENT 04/21/2007  . FIBROMYALGIA 04/21/2007  . RETRACTION/LAG, EYELID 09/20/2006  . PARESTHESIA 09/20/2006  . MRI, BRAIN, ABNORMAL 09/20/2006   Past Medical History  Diagnosis Date  . Hypertension   . HLD (hyperlipidemia)   . Back pain   . Fibromyalgia   . History of recurrent UTIs   . Anxiety reaction    Past Surgical History  Procedure Laterality Date  . Tonsillectomy    . Ovarian cyst removal    . Abdominal hysterectomy     History  Substance Use Topics  . Smoking status: Never Smoker   .  Smokeless tobacco: Not on file  . Alcohol Use: Yes     Comment: rare   Family History  Problem Relation Age of Onset  . Thyroid nodules Mother   . Heart disease Mother   . Hypertension Father   . Dementia Father   . Alcohol abuse Brother   . Hyperlipidemia Daughter   . Hypertension Daughter   . Cancer Maternal Aunt     breast  . Cancer Paternal Aunt     breast   Allergies  Allergen Reactions  . Amoxicillin-Pot Clavulanate Diarrhea    Diarrhea   . Nitrofurantoin     REACTION: hives   Current Outpatient Prescriptions on File Prior to Visit  Medication Sig Dispense Refill  . atenolol (TENORMIN) 25 MG tablet TAKE 1 TABLET BY MOUTH DAILY 90 tablet 3  . Diclofenac Sodium CR 100 MG 24 hr tablet TAKE 1 TABLET DAILY 90 tablet 3  . fish oil-omega-3 fatty acids 1000 MG capsule Take 1 g by mouth daily.      . Multiple Vitamins-Minerals (WOMENS MULTI PO) Take 1 tablet by mouth daily.      Marland Kitchen omeprazole (PRILOSEC) 20 MG capsule Take 20 mg by mouth daily.      . rosuvastatin (  CRESTOR) 5 MG tablet Take 1 tablet (5 mg total) by mouth daily. 90 tablet 3  . solifenacin (VESICARE) 10 MG tablet Take 10 mg by mouth daily.     . SUMAtriptan (IMITREX) 50 MG tablet Take 1 tablet (50 mg total) by mouth every 2 (two) hours as needed for migraine (maximum 2 doses per day for migraine). 9 tablet 11  . trimethoprim (TRIMPEX) 100 MG tablet Take 100 mg by mouth daily.      No current facility-administered medications on file prior to visit.     Review of Systems    Review of Systems  Constitutional: Negative for fever, appetite change, fatigue and unexpected weight change.  Eyes: Negative for pain and visual disturbance.  Respiratory: Negative for cough and shortness of breath.   Cardiovascular: Negative for cp or palpitations    Gastrointestinal: Negative for nausea, diarrhea and constipation.  Genitourinary: Negative for urgency and frequency.  Skin: Negative for pallor or rash   Neurological:  Negative for weakness, light-headedness, numbness and headaches.  Hematological: Negative for adenopathy. Does not bruise/bleed easily.  Psychiatric/Behavioral: Negative for dysphoric mood. The patient is not nervous/anxious.      Objective:   Physical Exam  Constitutional: She appears well-developed and well-nourished. No distress.  obese and well appearing   HENT:  Head: Normocephalic and atraumatic.  Mouth/Throat: Oropharynx is clear and moist.  Eyes: Conjunctivae and EOM are normal. Pupils are equal, round, and reactive to light.  Neck: Normal range of motion. Neck supple. No JVD present. Carotid bruit is not present. No thyromegaly present.  Cardiovascular: Normal rate, regular rhythm, normal heart sounds and intact distal pulses.  Exam reveals no gallop.   Pulmonary/Chest: Effort normal and breath sounds normal. No respiratory distress. She has no wheezes. She has no rales.  No crackles  Abdominal: Soft. Bowel sounds are normal. She exhibits no distension, no abdominal bruit and no mass. There is no tenderness.  Musculoskeletal: She exhibits no edema.  Lymphadenopathy:    She has no cervical adenopathy.  Neurological: She is alert. She has normal reflexes.  Skin: Skin is warm and dry. No rash noted.  Psychiatric: She has a normal mood and affect.          Assessment & Plan:   Problem List Items Addressed This Visit    Essential hypertension - Primary    Higher bp at other offices than here  BP Readings from Last 3 Encounters:  11/10/14 142/82  10/12/13 142/84  01/24/13 129/82   She will get a bp monitor and check at home/update if not at goal Rev lifestyle change F/u fall with cuff  Rev DASH diet and exercise       HYPERCHOLESTEROLEMIA    Pt will bring her most recent labs from [facility] and will review Per pt much imp with crestor and diet Rev low sat fat diet F/u fall -pt will get labs again with [facility] then as well

## 2014-11-10 NOTE — Patient Instructions (Addendum)
Please bring me your labs from [facility]  Work on healthy diet and exercise  Get a blood pressure cuff - OMRON for arm -size regular  If bp at home is over 140 systolic or 90 diastolic - we need to titrate medication (let me know)  Follow up with me as planned for your annual exam in winter  and bring your cuff as well

## 2014-11-11 NOTE — Assessment & Plan Note (Signed)
Pt will bring her most recent labs from [facility] and will review Per pt much imp with crestor and diet Rev low sat fat diet F/u fall -pt will get labs again with [facility] then as well

## 2014-11-11 NOTE — Assessment & Plan Note (Signed)
Higher bp at other offices than here  BP Readings from Last 3 Encounters:  11/10/14 142/82  10/12/13 142/84  01/24/13 129/82   She will get a bp monitor and check at home/update if not at goal Rev lifestyle change F/u fall with cuff  Rev DASH diet and exercise

## 2014-11-16 ENCOUNTER — Other Ambulatory Visit: Payer: Self-pay | Admitting: *Deleted

## 2014-11-16 MED ORDER — ATENOLOL 25 MG PO TABS: ORAL_TABLET | ORAL | Status: DC

## 2014-11-16 MED ORDER — DICLOFENAC SODIUM ER 100 MG PO TB24: 100.0000 mg | ORAL_TABLET | Freq: Every day | ORAL | Status: DC

## 2015-01-05 ENCOUNTER — Other Ambulatory Visit: Payer: Self-pay | Admitting: *Deleted

## 2015-01-05 MED ORDER — ROSUVASTATIN CALCIUM 5 MG PO TABS: 5.0000 mg | ORAL_TABLET | Freq: Every day | ORAL | Status: DC

## 2015-02-16 ENCOUNTER — Other Ambulatory Visit: Payer: Self-pay | Admitting: Family Medicine

## 2015-02-16 NOTE — Telephone Encounter (Signed)
Please refill 6 mo

## 2015-02-16 NOTE — Telephone Encounter (Signed)
done

## 2015-02-16 NOTE — Telephone Encounter (Signed)
Electronic refill request, pt has [facility] scheduled on 06/13/15, last refilled on  10/12/13 #9 with 11 additional refills, please advise

## 2015-02-21 ENCOUNTER — Other Ambulatory Visit: Payer: Self-pay

## 2015-02-21 DIAGNOSIS — Z1231 Encounter for screening mammogram for malignant neoplasm of breast: Secondary | ICD-10-CM

## 2015-04-04 ENCOUNTER — Ambulatory Visit
Admission: RE | Admit: 2015-04-04 | Discharge: 2015-04-04 | Disposition: A | Discharge status: Home / Self Care | Payer: 59 | Source: Ambulatory Visit

## 2015-04-04 DIAGNOSIS — Z1231 Encounter for screening mammogram for malignant neoplasm of breast: Secondary | ICD-10-CM

## 2015-04-04 LAB — HM MAMMOGRAPHY: HM Mammogram: NORMAL

## 2015-04-05 ENCOUNTER — Encounter: Payer: Self-pay | Admitting: *Deleted

## 2015-05-05 ENCOUNTER — Other Ambulatory Visit: Payer: Self-pay | Admitting: Family Medicine

## 2015-05-24 ENCOUNTER — Other Ambulatory Visit: Payer: Self-pay

## 2015-05-24 MED ORDER — ATENOLOL 25 MG PO TABS
25.0000 mg | ORAL_TABLET | Freq: Every day | ORAL | Status: DC
Start: 2015-05-24 — End: 2015-06-13

## 2015-05-24 MED ORDER — DICLOFENAC SODIUM ER 100 MG PO TB24
100.0000 mg | ORAL_TABLET | Freq: Every day | ORAL | Status: DC
Start: 2015-05-24 — End: 2015-06-13

## 2015-05-24 NOTE — Telephone Encounter (Signed)
Pt request refill atenolol and diclofenac to [facility]; pt did not get refills done at end of year before ins changed and no longer can use [employer] mail order. When pt was seen 07/16 was to return in fall with BP cuff. Pt will bring BP cuff to [facility] scheduled 06/13/15 and get refills updated at that time.

## 2015-06-09 ENCOUNTER — Other Ambulatory Visit (INDEPENDENT_AMBULATORY_CARE_PROVIDER_SITE_OTHER): Payer: BLUE CROSS/BLUE SHIELD

## 2015-06-09 ENCOUNTER — Telehealth: Payer: Self-pay | Admitting: Family Medicine

## 2015-06-09 DIAGNOSIS — Z0000 Encounter for general adult medical examination without abnormal findings: Secondary | ICD-10-CM

## 2015-06-09 LAB — TSH: TSH: 1.554 u[IU]/mL (ref 0.350–4.500)

## 2015-06-09 LAB — CBC WITH DIFFERENTIAL/PLATELET
BASOS ABS: 0 10*3/uL (ref 0.0–0.1)
Basophils Relative: 0 % (ref 0–1)
EOS ABS: 0.4 10*3/uL (ref 0.0–0.7)
Eosinophils Relative: 6 % — ABNORMAL HIGH (ref 0–5)
HCT: 43.4 % (ref 36.0–46.0)
Hemoglobin: 14.2 g/dL (ref 12.0–15.0)
Lymphocytes Relative: 26 % (ref 12–46)
Lymphs Abs: 1.6 10*3/uL (ref 0.7–4.0)
MCH: 29.3 pg (ref 26.0–34.0)
MCHC: 32.7 g/dL (ref 30.0–36.0)
MCV: 89.7 fL (ref 78.0–100.0)
MONO ABS: 0.5 10*3/uL (ref 0.1–1.0)
MPV: 10.6 fL (ref 8.6–12.4)
Monocytes Relative: 8 % (ref 3–12)
Neutro Abs: 3.6 10*3/uL (ref 1.7–7.7)
Neutrophils Relative %: 60 % (ref 43–77)
PLATELETS: 257 10*3/uL (ref 150–400)
RBC: 4.84 MIL/uL (ref 3.87–5.11)
RDW: 13.2 % (ref 11.5–15.5)
WBC: 6 10*3/uL (ref 4.0–10.5)

## 2015-06-09 LAB — COMPREHENSIVE METABOLIC PANEL
ALK PHOS: 72 U/L (ref 33–130)
ALT: 21 U/L (ref 6–29)
AST: 20 U/L (ref 10–35)
Albumin: 4.6 g/dL (ref 3.6–5.1)
BUN: 18 mg/dL (ref 7–25)
CO2: 26 mmol/L (ref 20–31)
Calcium: 9.4 mg/dL (ref 8.6–10.4)
Chloride: 104 mmol/L (ref 98–110)
Creat: 0.77 mg/dL (ref 0.50–0.99)
GLUCOSE: 118 mg/dL — AB (ref 65–99)
Potassium: 4.7 mmol/L (ref 3.5–5.3)
Sodium: 140 mmol/L (ref 135–146)
Total Bilirubin: 0.6 mg/dL (ref 0.2–1.2)
Total Protein: 6.8 g/dL (ref 6.1–8.1)

## 2015-06-09 LAB — LIPID PANEL
Cholesterol: 175 mg/dL (ref 125–200)
HDL: 40 mg/dL — ABNORMAL LOW (ref >=46)
LDL Cholesterol: 107 mg/dL (ref <130)
Total CHOL/HDL Ratio: 4.4 Ratio (ref <=5.0)
Triglycerides: 140 mg/dL (ref <150)
VLDL: 28 mg/dL (ref <30)

## 2015-06-09 NOTE — Telephone Encounter (Signed)
-----   Message from Alvina Chou sent at 06/01/2015  3:13 PM EST ----- Regarding: Lab orders for Thursdday, 2.2.17 Patient is scheduled for [facility] labs, please order future labs, Thanks , Camelia Eng

## 2015-06-13 ENCOUNTER — Ambulatory Visit (INDEPENDENT_AMBULATORY_CARE_PROVIDER_SITE_OTHER): Payer: BLUE CROSS/BLUE SHIELD | Admitting: Family Medicine

## 2015-06-13 ENCOUNTER — Encounter: Payer: Self-pay | Admitting: Family Medicine

## 2015-06-13 VITALS — BP 132/82 | HR 65 | Temp 98.6°F | Ht 63.5 in | Wt 183.5 lb

## 2015-06-13 DIAGNOSIS — E669 Obesity, unspecified: Secondary | ICD-10-CM

## 2015-06-13 DIAGNOSIS — I10 Essential (primary) hypertension: Secondary | ICD-10-CM | POA: Diagnosis not present

## 2015-06-13 DIAGNOSIS — R739 Hyperglycemia, unspecified: Secondary | ICD-10-CM

## 2015-06-13 DIAGNOSIS — R7303 Prediabetes: Secondary | ICD-10-CM | POA: Insufficient documentation

## 2015-06-13 DIAGNOSIS — E7800 Pure hypercholesterolemia, unspecified: Secondary | ICD-10-CM | POA: Diagnosis not present

## 2015-06-13 DIAGNOSIS — Z0000 Encounter for general adult medical examination without abnormal findings: Secondary | ICD-10-CM | POA: Diagnosis not present

## 2015-06-13 MED ORDER — ROSUVASTATIN CALCIUM 5 MG PO TABS: 5.0000 mg | ORAL_TABLET | Freq: Every day | ORAL | Status: DC

## 2015-06-13 MED ORDER — ATENOLOL 25 MG PO TABS: 25.0000 mg | ORAL_TABLET | Freq: Every day | ORAL | Status: DC

## 2015-06-13 MED ORDER — SUMATRIPTAN SUCCINATE 50 MG PO TABS: ORAL_TABLET | ORAL | Status: DC

## 2015-06-13 MED ORDER — DICLOFENAC SODIUM ER 100 MG PO TB24: 100.0000 mg | ORAL_TABLET | Freq: Every day | ORAL | Status: DC

## 2015-06-13 NOTE — Assessment & Plan Note (Signed)
Disc goals for lipids and reasons to control them Rev labs with pt Rev low sat fat diet in detail Controlled with crestor and diet  

## 2015-06-13 NOTE — Assessment & Plan Note (Signed)
Fasting glucose in one teends Disc imp of prev of DM with wt loss and low glycemic diet and exercise Check A1C in 3 months

## 2015-06-13 NOTE — Assessment & Plan Note (Signed)
bp in fair control at this time  BP Readings from Last 1 Encounters:  06/13/15 132/82   No changes needed Disc lifstyle change with low sodium diet and exercise  Labs reviewed

## 2015-06-13 NOTE — Assessment & Plan Note (Signed)
Discussed how this problem influences overall health and the risks it imposes  Reviewed plan for weight loss with lower calorie diet (via better food choices and also portion control or program like weight watchers) and exercise building up to or more than 30 minutes 5 days per week including some aerobic activity    

## 2015-06-13 NOTE — Progress Notes (Signed)
Pre visit review using our clinic review tool, if applicable. No additional management support is needed unless otherwise documented below in the visit note. 

## 2015-06-13 NOTE — Progress Notes (Signed)
Subjective:    Patient ID: Amy Mills, female    DOB: [birth year], 65 y.o.   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  Wt is up 2 lb with bmi of 31   Screening for hep C/HIV-not interested in screening   Zoster vaccine- not interested in one yet   Td 1/07-does not want to get it today   Flu shot- declines  Pap - not needed-no symptoms   Mm 11/16  Self exam-no lumps or changes   colonosc 1/12 - nl with 10 year recall No change in stool Does not want to do stool kit   dexa 5/09 nl  No falls or fractures  Takes calcium and D   Needs paper px for in   bp is stable today  No cp or palpitations or headaches or edema  No side effects to medicines  BP Readings from Last 3 Encounters:  06/13/15 132/82  11/10/14 142/82  10/12/13 142/84     On her cuff 130/71   Wt is up 2 lb  Is trying to eat a healthy diet  Not exercising a lot -new job will allow her to exercise     Chemistry      Component Value Date/Time   NA 140 06/09/2015 0823   K 4.7 06/09/2015 0823   CL 104 06/09/2015 0823   CO2 26 06/09/2015 0823   BUN 18 06/09/2015 0823   CREATININE 0.77 06/09/2015 0823   CREATININE 0.8 10/08/2013 0830      Component Value Date/Time   CALCIUM 9.4 06/09/2015 0823   ALKPHOS 72 06/09/2015 0823   AST 20 06/09/2015 0823   ALT 21 06/09/2015 0823   BILITOT 0.6 06/09/2015 0823      Lab Results  Component Value Date   WBC 6.0 06/09/2015   HGB 14.2 06/09/2015   HCT 43.4 06/09/2015   MCV 89.7 06/09/2015   PLT 257 06/09/2015    Cholesterol Lab Results  Component Value Date   CHOL 175 06/09/2015   CHOL 259* 10/08/2013   CHOL 164 09/10/2012   Lab Results  Component Value Date   HDL 40* 06/09/2015   HDL 41.70 10/08/2013   HDL 41 09/10/2012   Lab Results  Component Value Date   LDLCALC 107 06/09/2015   LDLCALC 170*  10/08/2013   LDLCALC 96 09/10/2012   Lab Results  Component Value Date   TRIG 140 06/09/2015   TRIG 236.0* 10/08/2013   TRIG 133 09/10/2012   Lab Results  Component Value Date   CHOLHDL 4.4 06/09/2015   CHOLHDL 6 10/08/2013   CHOLHDL 4.0 09/10/2012   Lab Results  Component Value Date   LDLDIRECT 212.8 07/02/2006    Huge improvement on crestor  Trying to eat a low sat fat diet   Lab Results  Component Value Date   TSH 1.554 06/09/2015    Glucose was 118  She craves sweets after menopause  Some carbs -not as much     Patient Active Problem List   Diagnosis Date Noted  . Hyperglycemia 06/13/2015  . Obesity 06/13/2015  . Routine general medical examination at a health care facility 09/14/2011  . STRESS REACTION, ACUTE, WITH EMOTIONAL DISTURBANCE 05/12/2008  . HYPERCHOLESTEROLEMIA 04/21/2007  . Essential hypertension 04/21/2007  . UTI'S, RECURRENT 04/21/2007  . FIBROMYALGIA 04/21/2007  . RETRACTION/LAG, EYELID 09/20/2006  .  PARESTHESIA 09/20/2006  . MRI, BRAIN, ABNORMAL 09/20/2006   Past Medical History  Diagnosis Date  . Hypertension   . HLD (hyperlipidemia)   . Back pain   . Fibromyalgia   . History of recurrent UTIs   . Anxiety reaction    Past Surgical History  Procedure Laterality Date  . Tonsillectomy    . Ovarian cyst removal    . Abdominal hysterectomy     Social History  Substance Use Topics  . Smoking status: Never Smoker   . Smokeless tobacco: None  . Alcohol Use: 0.0 oz/week    0 Standard drinks or equivalent per week     Comment: rare   Family History  Problem Relation Age of Onset  . Thyroid nodules Mother   . Heart disease Mother   . Hypertension Father   . Dementia Father   . Alcohol abuse Brother   . Hyperlipidemia Daughter   . Hypertension Daughter   . Cancer Maternal Aunt     breast  . Cancer Paternal Aunt     breast   Allergies  Allergen Reactions  . Amoxicillin-Pot Clavulanate Diarrhea    Diarrhea   .  Nitrofurantoin     REACTION: hives   Current Outpatient Prescriptions on File Prior to Visit  Medication Sig Dispense Refill  . fish oil-omega-3 fatty acids 1000 MG capsule Take 1 g by mouth daily.      . Multiple Vitamins-Minerals (WOMENS MULTI PO) Take 1 tablet by mouth daily.      Marland Kitchen omeprazole (PRILOSEC) 20 MG capsule Take 20 mg by mouth daily.      . solifenacin (VESICARE) 10 MG tablet Take 10 mg by mouth daily.     Marland Kitchen trimethoprim (TRIMPEX) 100 MG tablet Take 100 mg by mouth daily.      No current facility-administered medications on file prior to visit.    Review of Systems    Review of Systems  Constitutional: Negative for fever, appetite change, fatigue and unexpected weight change.  Eyes: Negative for pain and visual disturbance.  Respiratory: Negative for cough and shortness of breath.   Cardiovascular: Negative for cp or palpitations   occ pain in esophagus - ? Spasm- not exertional Gastrointestinal: Negative for nausea, diarrhea and constipation.  Genitourinary: Negative for urgency and frequency.  Skin: Negative for pallor or rash   Neurological: Negative for weakness, light-headedness, numbness and headaches.  Hematological: Negative for adenopathy. Does not bruise/bleed easily.  Psychiatric/Behavioral: Negative for dysphoric mood. The patient is not nervous/anxious.      Objective:   Physical Exam  Constitutional: She appears well-developed and well-nourished. No distress.  obese and well appearing   HENT:  Head: Normocephalic and atraumatic.  Right Ear: External ear normal.  Left Ear: External ear normal.  Mouth/Throat: Oropharynx is clear and moist.  Eyes: Conjunctivae and EOM are normal. Pupils are equal, round, and reactive to light. No scleral icterus.  Neck: Normal range of motion. Neck supple. No JVD present. Carotid bruit is not present. No thyromegaly present.  Cardiovascular: Normal rate, regular rhythm, normal heart sounds and intact distal pulses.   Exam reveals no gallop.   Pulmonary/Chest: Effort normal and breath sounds normal. No respiratory distress. She has no wheezes. She exhibits no tenderness.  Abdominal: Soft. Bowel sounds are normal. She exhibits no distension, no abdominal bruit and no mass. There is no tenderness.  Genitourinary: No breast swelling, tenderness, discharge or bleeding.  Musculoskeletal: Normal range of motion. She exhibits no edema or tenderness.  Lymphadenopathy:    She has no cervical adenopathy.  Neurological: She is alert. She has normal reflexes. No cranial nerve deficit. She exhibits normal muscle tone. Coordination normal.  Skin: Skin is warm and dry. No rash noted. No erythema. No pallor.  Lentigines solar diffusely  Psychiatric: She has a normal mood and affect.          Assessment & Plan:   Problem List Items Addressed This Visit      Cardiovascular and Mediastinum   Essential hypertension - Primary    bp in fair control at this time  BP Readings from Last 1 Encounters:  06/13/15 132/82   No changes needed Disc lifstyle change with low sodium diet and exercise  Labs reviewed       Relevant Medications   atenolol (TENORMIN) 25 MG tablet   rosuvastatin (CRESTOR) 5 MG tablet   Other Relevant Orders   EKG 12-Lead (Completed)     Other   HYPERCHOLESTEROLEMIA    Disc goals for lipids and reasons to control them Rev labs with pt Rev low sat fat diet in detail Controlled with crestor and diet       Relevant Medications   atenolol (TENORMIN) 25 MG tablet   rosuvastatin (CRESTOR) 5 MG tablet   Hyperglycemia    Fasting glucose in one teends Disc imp of prev of DM with wt loss and low glycemic diet and exercise Check A1C in 3 months       Obesity    Discussed how this problem influences overall health and the risks it imposes  Reviewed plan for weight loss with lower calorie diet (via better food choices and also portion control or program like weight watchers) and exercise  building up to or more than 30 minutes 5 days per week including some aerobic activity         Routine general medical examination at a health care facility    Reviewed health habits including diet and exercise and skin cancer prevention Reviewed appropriate screening tests for age  Also reviewed health mt list, fam hx and immunization status , as well as social and family history   See HPI Lab today Pt declines flu and Tetanus shot today Declines zoster vaccine  Glucose is mildly high  Watch sugar and carbs in your diet  Start exercising regularly  Schedule non fasting lab in 3 months for A1C You are due for a Tetnus shot (Tdap) - when you want it let us know   Take care of yourself

## 2015-06-13 NOTE — Assessment & Plan Note (Signed)
Reviewed health habits including diet and exercise and skin cancer prevention Reviewed appropriate screening tests for age  Also reviewed health mt list, fam hx and immunization status , as well as social and family history   See HPI Lab today Pt declines flu and Tetanus shot today Declines zoster vaccine  Glucose is mildly high  Watch sugar and carbs in your diet  Start exercising regularly  Schedule non fasting lab in 3 months for A1C You are due for a Tetnus shot (Tdap) - when you want it let us know   Take care of yourself

## 2015-06-13 NOTE — Patient Instructions (Signed)
Watch sugar and carbs in your diet  Start exercising regularly  Schedule non fasting lab in 3 months for A1C You are due for a Tetnus shot (Tdap) - when you want it let us know   Take care of yourself

## 2015-07-01 ENCOUNTER — Other Ambulatory Visit: Payer: Self-pay | Admitting: *Deleted

## 2015-07-01 MED ORDER — ATENOLOL 25 MG PO TABS: 25.0000 mg | ORAL_TABLET | Freq: Every day | ORAL | Status: DC

## 2015-07-04 ENCOUNTER — Other Ambulatory Visit: Payer: Self-pay

## 2015-07-04 MED ORDER — DICLOFENAC SODIUM ER 100 MG PO TB24: 100.0000 mg | ORAL_TABLET | Freq: Every day | ORAL | Status: DC

## 2015-07-04 MED ORDER — ROSUVASTATIN CALCIUM 5 MG PO TABS: 5.0000 mg | ORAL_TABLET | Freq: Every day | ORAL | Status: DC

## 2015-07-04 NOTE — Telephone Encounter (Signed)
Pt is waiting on mail order rx from new mail order pharmacy and request 2 week rx for diclofenac and crestor to [facility]; advised pt done.

## 2015-07-26 ENCOUNTER — Other Ambulatory Visit: Payer: Self-pay | Admitting: Family Medicine

## 2015-08-13 ENCOUNTER — Other Ambulatory Visit: Payer: Self-pay | Admitting: Family Medicine

## 2015-09-12 ENCOUNTER — Other Ambulatory Visit (INDEPENDENT_AMBULATORY_CARE_PROVIDER_SITE_OTHER): Payer: Self-pay

## 2015-09-12 DIAGNOSIS — R739 Hyperglycemia, unspecified: Secondary | ICD-10-CM

## 2015-09-13 LAB — HEMOGLOBIN A1C: HEMOGLOBIN A1C: 6.2 % (ref 4.6–6.5)

## 2016-02-15 ENCOUNTER — Other Ambulatory Visit: Payer: Self-pay | Admitting: Family Medicine

## 2016-02-15 DIAGNOSIS — Z1231 Encounter for screening mammogram for malignant neoplasm of breast: Secondary | ICD-10-CM

## 2016-04-04 ENCOUNTER — Ambulatory Visit
Admission: RE | Admit: 2016-04-04 | Discharge: 2016-04-04 | Disposition: A | Discharge status: Home / Self Care | Payer: 59 | Source: Ambulatory Visit | Attending: Family Medicine | Admitting: Family Medicine

## 2016-04-04 DIAGNOSIS — Z1231 Encounter for screening mammogram for malignant neoplasm of breast: Secondary | ICD-10-CM

## 2016-04-23 ENCOUNTER — Other Ambulatory Visit: Payer: Self-pay

## 2016-04-23 MED ORDER — DICLOFENAC SODIUM ER 100 MG PO TB24: 100.0000 mg | ORAL_TABLET | Freq: Every day | ORAL | 1 refills | Status: DC

## 2016-04-23 MED ORDER — ATENOLOL 25 MG PO TABS
25.0000 mg | ORAL_TABLET | Freq: Every day | ORAL | 0 refills | Status: DC
Start: 2016-04-23 — End: 2016-08-24

## 2016-04-23 NOTE — Telephone Encounter (Signed)
Ok to refill diclofenac times one

## 2016-04-23 NOTE — Telephone Encounter (Signed)
Pt left v/m; pt is changing from commercial ins to medicare; pt will not have coverage for meds until 05/07/2016.  Pt request 30 day rx for atenolol and diclofenac.Pt is out of atenolol(refill done per requested) and diclofenac (last refilled # 15 on 07/01/15) last annual exam on 06/13/15.[facility] advise[facility]

## 2016-04-23 NOTE — Telephone Encounter (Signed)
done

## 2016-08-19 ENCOUNTER — Telehealth: Payer: Self-pay | Admitting: Family Medicine

## 2016-08-19 DIAGNOSIS — R739 Hyperglycemia, unspecified: Secondary | ICD-10-CM

## 2016-08-19 DIAGNOSIS — Z0000 Encounter for general adult medical examination without abnormal findings: Secondary | ICD-10-CM

## 2016-08-19 NOTE — Telephone Encounter (Signed)
-----   Message from Alvina Chou sent at 08/16/2016 10:31 AM EDT ----- Regarding: Lab orders for Monday, 4.16.18 Patient is scheduled for CPX labs, please order future labs, Thanks , Camelia Eng

## 2016-08-20 ENCOUNTER — Other Ambulatory Visit (INDEPENDENT_AMBULATORY_CARE_PROVIDER_SITE_OTHER): Payer: PPO

## 2016-08-20 DIAGNOSIS — Z0000 Encounter for general adult medical examination without abnormal findings: Secondary | ICD-10-CM

## 2016-08-20 DIAGNOSIS — R739 Hyperglycemia, unspecified: Secondary | ICD-10-CM

## 2016-08-20 LAB — CBC WITH DIFFERENTIAL/PLATELET
Basophils Absolute: 0.1 10*3/uL (ref 0.0–0.1)
Basophils Relative: 0.9 % (ref 0.0–3.0)
Eosinophils Absolute: 0.4 10*3/uL (ref 0.0–0.7)
Eosinophils Relative: 5.4 % — ABNORMAL HIGH (ref 0.0–5.0)
HCT: 41.9 % (ref 36.0–46.0)
Hemoglobin: 13.9 g/dL (ref 12.0–15.0)
Lymphocytes Relative: 26.7 % (ref 12.0–46.0)
Lymphs Abs: 1.9 10*3/uL (ref 0.7–4.0)
MCHC: 33.2 g/dL (ref 30.0–36.0)
MCV: 90.1 fl (ref 78.0–100.0)
Monocytes Absolute: 0.6 10*3/uL (ref 0.1–1.0)
Monocytes Relative: 7.9 % (ref 3.0–12.0)
Neutro Abs: 4.2 10*3/uL (ref 1.4–7.7)
Neutrophils Relative %: 59.1 % (ref 43.0–77.0)
PLATELETS: 263 10*3/uL (ref 150.0–400.0)
RBC: 4.65 Mil/uL (ref 3.87–5.11)
RDW: 13.6 % (ref 11.5–15.5)
WBC: 7.1 10*3/uL (ref 4.0–10.5)

## 2016-08-20 LAB — COMPREHENSIVE METABOLIC PANEL
ALK PHOS: 71 U/L (ref 39–117)
ALT: 28 U/L (ref 0–35)
AST: 23 U/L (ref 0–37)
Albumin: 4.5 g/dL (ref 3.5–5.2)
BUN: 22 mg/dL (ref 6–23)
CO2: 28 mEq/L (ref 19–32)
Calcium: 9.6 mg/dL (ref 8.4–10.5)
Chloride: 104 mEq/L (ref 96–112)
Creatinine, Ser: 0.78 mg/dL (ref 0.40–1.20)
GFR: 78.65 mL/min (ref >60.00)
Glucose, Bld: 109 mg/dL — ABNORMAL HIGH (ref 70–99)
POTASSIUM: 4.5 meq/L (ref 3.5–5.1)
SODIUM: 140 meq/L (ref 135–145)
TOTAL PROTEIN: 7 g/dL (ref 6.0–8.3)
Total Bilirubin: 0.5 mg/dL (ref 0.2–1.2)

## 2016-08-20 LAB — LIPID PANEL
CHOL/HDL RATIO: 5
Cholesterol: 190 mg/dL (ref 0–200)
HDL: 40.6 mg/dL (ref >39.00)
LDL CALC: 110 mg/dL — AB (ref 0–99)
NonHDL: 149.02
TRIGLYCERIDES: 193 mg/dL — AB (ref 0.0–149.0)
VLDL: 38.6 mg/dL (ref 0.0–40.0)

## 2016-08-20 LAB — TSH: TSH: 2.21 u[IU]/mL (ref 0.35–4.50)

## 2016-08-20 LAB — HEMOGLOBIN A1C: HEMOGLOBIN A1C: 6.2 % (ref 4.6–6.5)

## 2016-08-24 ENCOUNTER — Encounter: Payer: Self-pay | Admitting: Family Medicine

## 2016-08-24 ENCOUNTER — Ambulatory Visit (INDEPENDENT_AMBULATORY_CARE_PROVIDER_SITE_OTHER): Payer: PPO | Admitting: Family Medicine

## 2016-08-24 VITALS — BP 134/70 | HR 63 | Temp 98.1°F | Ht 63.25 in | Wt 189.0 lb

## 2016-08-24 DIAGNOSIS — Z6833 Body mass index (BMI) 33.0-33.9, adult: Secondary | ICD-10-CM | POA: Diagnosis not present

## 2016-08-24 DIAGNOSIS — Z0000 Encounter for general adult medical examination without abnormal findings: Secondary | ICD-10-CM

## 2016-08-24 DIAGNOSIS — E6609 Other obesity due to excess calories: Secondary | ICD-10-CM | POA: Diagnosis not present

## 2016-08-24 DIAGNOSIS — R739 Hyperglycemia, unspecified: Secondary | ICD-10-CM | POA: Diagnosis not present

## 2016-08-24 DIAGNOSIS — E7800 Pure hypercholesterolemia, unspecified: Secondary | ICD-10-CM

## 2016-08-24 DIAGNOSIS — I10 Essential (primary) hypertension: Secondary | ICD-10-CM

## 2016-08-24 MED ORDER — ATENOLOL 25 MG PO TABS: 25.0000 mg | ORAL_TABLET | Freq: Every day | ORAL | 11 refills | Status: DC

## 2016-08-24 MED ORDER — DICLOFENAC SODIUM ER 100 MG PO TB24: 100.0000 mg | ORAL_TABLET | Freq: Every day | ORAL | 11 refills | Status: DC

## 2016-08-24 MED ORDER — SUMATRIPTAN SUCCINATE 50 MG PO TABS: ORAL_TABLET | ORAL | 11 refills | Status: DC

## 2016-08-24 MED ORDER — ROSUVASTATIN CALCIUM 5 MG PO TABS: ORAL_TABLET | ORAL | 11 refills | Status: DC

## 2016-08-24 NOTE — Progress Notes (Signed)
Pre visit review using our clinic review tool, if applicable. No additional management support is needed unless otherwise documented below in the visit note. 

## 2016-08-24 NOTE — Progress Notes (Signed)
Subjective:    Patient ID: Amy Mills, female    DOB: August 16, 1950, 66 y.o.   MRN: [id number]  HPI I have personally reviewed the Medicare Annual Wellness questionnaire and have noted 1. The patient's medical and social history 2. Their use of alcohol, tobacco or illicit drugs 3. Their current medications and supplements 4. The patient's functional ability including ADL's, fall risks, home safety risks and hearing or visual             impairment. 5. Diet and physical activities 6. Evidence for depression or mood disorders  The patients weight, height, BMI have been recorded in the chart and visual acuity is per eye clinic.  I have made referrals, counseling and provided education to the patient based review of the above and I have provided the pt with a written personalized care plan for preventive services. Reviewed and updated provider list, see scanned forms.  See scanned forms.  Routine anticipatory guidance given to patient.  See health maintenance. Colon cancer screening- 1/12- 10 year recall / does not know about fam hx  Breast cancer screening mammogram 11/17  Self breast exam- no lumps  Has had a hysterectomy No symptoms at all and no new partners  Flu vaccine- does not get  Tetanus vaccine 07- will get at a pharmacy Pneumovax- she declines - wants to read about it  Zoster vaccine-unsure if interested, will read about it  Hep C /HIV screen -will do at next PE   Advance directive-she does not have  Cognitive function addressed- see scanned forms- and if abnormal then additional documentation follows. , she has some occ short term memory issues/frustrating  No concerns overall   PMH and SH reviewed  Meds, vitals, and allergies reviewed.   ROS: See HPI.  Otherwise negative.    Had EKG a year ago- and will not do one today  She stopped her vesicare - read that it was linked to alzheimer's  She takes azo otc occas   Wt Readings from Last 3 Encounters:  08/24/16 189  lb (85.7 kg)  06/13/15 183 lb 8 oz (83.2 kg)  11/10/14 181 lb (82.1 kg)  bmi is 33.2  Not eating perfectly  Waxes and wanes with exercise  She sits a lot at work   bp is stable today  No cp or palpitations or headaches or edema  No side effects to medicines  BP Readings from Last 3 Encounters:  08/24/16 134/70  06/13/15 132/82  11/10/14 (!) 142/82      Hx of hyperglycemia Lab Results  Component Value Date   HGBA1C 6.2 08/20/2016  this is stable Trying to control carbs    Hx of hyperlipidemia Lab Results  Component Value Date   CHOL 190 08/20/2016   CHOL 175 06/09/2015   CHOL 259 (H) 10/08/2013   Lab Results  Component Value Date   HDL 40.60 08/20/2016   HDL 40 (L) 06/09/2015   HDL 41.70 10/08/2013   Lab Results  Component Value Date   LDLCALC 110 (H) 08/20/2016   LDLCALC 107 06/09/2015   LDLCALC 170 (H) 10/08/2013   Lab Results  Component Value Date   TRIG 193.0 (H) 08/20/2016   TRIG 140 06/09/2015   TRIG 236.0 (H) 10/08/2013   Lab Results  Component Value Date   CHOLHDL 5 08/20/2016   CHOLHDL 4.4 06/09/2015   CHOLHDL 6 10/08/2013   Lab Results  Component Value Date   LDLDIRECT 212.8 07/02/2006   Diet not perfect  LDL stable  Very low dose crestor  Liver tests ok     Chemistry      Component Value Date/Time   NA 140 08/20/2016 0916   K 4.5 08/20/2016 0916   CL 104 08/20/2016 0916   CO2 28 08/20/2016 0916   BUN 22 08/20/2016 0916   CREATININE 0.78 08/20/2016 0916   CREATININE 0.77 06/09/2015 0823      Component Value Date/Time   CALCIUM 9.6 08/20/2016 0916   ALKPHOS 71 08/20/2016 0916   AST 23 08/20/2016 0916   ALT 28 08/20/2016 0916   BILITOT 0.5 08/20/2016 0916     Lab Results  Component Value Date   WBC 7.1 08/20/2016   HGB 13.9 08/20/2016   HCT 41.9 08/20/2016   MCV 90.1 08/20/2016   PLT 263.0 08/20/2016    Lab Results  Component Value Date   TSH 2.21 08/20/2016    Patient Active Problem List   Diagnosis Date Noted    . Welcome to Medicare preventive visit 08/26/2016  . Hyperglycemia 06/13/2015  . Obesity 06/13/2015  . Routine general medical examination at a health care facility 09/14/2011  . STRESS REACTION, ACUTE, WITH EMOTIONAL DISTURBANCE 05/12/2008  . HYPERCHOLESTEROLEMIA 04/21/2007  . Essential hypertension 04/21/2007  . UTI'S, RECURRENT 04/21/2007  . FIBROMYALGIA 04/21/2007  . RETRACTION/LAG, EYELID 09/20/2006  . PARESTHESIA 09/20/2006  . MRI, BRAIN, ABNORMAL 09/20/2006   Past Medical History:  Diagnosis Date  . Anxiety reaction   . Back pain   . Fibromyalgia   . History of recurrent UTIs   . HLD (hyperlipidemia)   . Hypertension    Past Surgical History:  Procedure Laterality Date  . ABDOMINAL HYSTERECTOMY    . OVARIAN CYST REMOVAL    . TONSILLECTOMY     Social History  Substance Use Topics  . Smoking status: Never Smoker  . Smokeless tobacco: Never Used  . Alcohol use 0.0 oz/week     Comment: rare   Family History  Problem Relation Age of Onset  . Thyroid nodules Mother   . Heart disease Mother   . Hypertension Father   . Dementia Father   . Alcohol abuse Brother   . Hyperlipidemia Daughter   . Hypertension Daughter   . Cancer Maternal Aunt     breast  . Cancer Paternal Aunt     breast   Allergies  Allergen Reactions  . Amoxicillin-Pot Clavulanate Diarrhea    Diarrhea   . Nitrofurantoin     REACTION: hives   Current Outpatient Prescriptions on File Prior to Visit  Medication Sig Dispense Refill  . fish oil-omega-3 fatty acids 1000 MG capsule Take 1 g by mouth daily.      . Multiple Vitamins-Minerals (WOMENS MULTI PO) Take 1 tablet by mouth daily.      Marland Kitchen trimethoprim (TRIMPEX) 100 MG tablet Take 100 mg by mouth daily.      No current facility-administered medications on file prior to visit.     Review of Systems Review of Systems  Constitutional: Negative for fever, appetite change, fatigue and unexpected weight change.  Eyes: Negative for pain and  visual disturbance.  Respiratory: Negative for cough and shortness of breath.   Cardiovascular: Negative for cp or palpitations    Gastrointestinal: Negative for nausea, diarrhea and constipation.  Genitourinary: Negative for urgency and frequency.  Skin: Negative for pallor or rash   Neurological: Negative for weakness, light-headedness, numbness and headaches.  Hematological: Negative for adenopathy. Does not bruise/bleed easily.  Psychiatric/Behavioral: Negative for  dysphoric mood. The patient is not nervous/anxious.         Objective:   Physical Exam  Constitutional: She appears well-developed and well-nourished. No distress.  obese and well appearing   HENT:  Head: Normocephalic and atraumatic.  Right Ear: External ear normal.  Left Ear: External ear normal.  Mouth/Throat: Oropharynx is clear and moist.  Eyes: Conjunctivae and EOM are normal. Pupils are equal, round, and reactive to light. No scleral icterus.  Neck: Normal range of motion. Neck supple. No JVD present. Carotid bruit is not present. No thyromegaly present.  Cardiovascular: Normal rate, regular rhythm, normal heart sounds and intact distal pulses.  Exam reveals no gallop.   Pulmonary/Chest: Effort normal and breath sounds normal. No respiratory distress. She has no wheezes. She exhibits no tenderness.  Abdominal: Soft. Bowel sounds are normal. She exhibits no distension, no abdominal bruit and no mass. There is no tenderness.  Genitourinary: No breast swelling, tenderness, discharge or bleeding.  Genitourinary Comments: Breast exam: No mass, nodules, thickening, tenderness, bulging, retraction, inflamation, nipple discharge or skin changes noted.  No axillary or clavicular LA.      Musculoskeletal: Normal range of motion. She exhibits no edema or tenderness.  Lymphadenopathy:    She has no cervical adenopathy.  Neurological: She is alert. She has normal reflexes. No cranial nerve deficit. She exhibits normal muscle  tone. Coordination normal.  Skin: Skin is warm and dry. No rash noted. No erythema. No pallor.  Some lentigines   Psychiatric: She has a normal mood and affect.  Pleasant           Assessment & Plan:   Problem List Items Addressed This Visit      Cardiovascular and Mediastinum   Essential hypertension - Primary    bp in fair control at this time  BP Readings from Last 1 Encounters:  08/24/16 134/70   No changes needed Disc lifstyle change with low sodium diet and exercise  Labs reviewed       Relevant Medications   atenolol (TENORMIN) 25 MG tablet   rosuvastatin (CRESTOR) 5 MG tablet     Other   HYPERCHOLESTEROLEMIA    Disc goals for lipids and reasons to control them Rev labs with pt Rev low sat fat diet in detail  Tolerates low dose crestor-nl LFTs  Plans to watch diet more closely      Relevant Medications   atenolol (TENORMIN) 25 MG tablet   rosuvastatin (CRESTOR) 5 MG tablet   Hyperglycemia    Lab Results  Component Value Date   HGBA1C 6.2 08/20/2016   disc imp of low glycemic diet and wt loss to prevent DM2       Obesity    Discussed how this problem influences overall health and the risks it imposes  Reviewed plan for weight loss with lower calorie diet (via better food choices and also portion control or program like weight watchers) and exercise building up to or more than 30 minutes 5 days per week including some aerobic activity         Welcome to Medicare preventive visit    Reviewed health habits including diet and exercise and skin cancer prevention Reviewed appropriate screening tests for age  Also reviewed health mt list, fam hx and immunization status , as well as social and family history   See HPI Labs reviewed  Declines prevnar until she can read more about it  She will also look into zoster vaccine  She plans to  get a tetanus shot at the pharmacy Info given on adv directive No cognitive concerns No falls Had EKG a year ago

## 2016-08-24 NOTE — Patient Instructions (Addendum)
I gave you a form re: getting a tetanus shot at a pharmacy   Do  Some reading about prevnar vaccine (now)  And then a year later the pnemovax 23  They are recommended starting at 40   New shingles vaccine  is called Shingrix  If you are interested in a shingles/zoster vaccine - call your insurance to check on coverage,( you should not get it within 1 month of other vaccines) , then call us for a prescription  for it to take to a pharmacy that gives the shot , or make a nurse visit to get it here depending on your coverage   Take a look at the packet on Advance directive

## 2016-08-26 DIAGNOSIS — Z0000 Encounter for general adult medical examination without abnormal findings: Secondary | ICD-10-CM | POA: Insufficient documentation

## 2016-08-26 NOTE — Assessment & Plan Note (Signed)
Discussed how this problem influences overall health and the risks it imposes  Reviewed plan for weight loss with lower calorie diet (via better food choices and also portion control or program like weight watchers) and exercise building up to or more than 30 minutes 5 days per week including some aerobic activity    

## 2016-08-26 NOTE — Assessment & Plan Note (Signed)
Reviewed health habits including diet and exercise and skin cancer prevention Reviewed appropriate screening tests for age  Also reviewed health mt list, fam hx and immunization status , as well as social and family history   See HPI Labs reviewed  Declines prevnar until she can read more about it  She will also look into zoster vaccine  She plans to get a tetanus shot at the pharmacy Info given on adv directive No cognitive concerns No falls Had EKG a year ago

## 2016-08-26 NOTE — Assessment & Plan Note (Signed)
bp in fair control at this time  BP Readings from Last 1 Encounters:  08/24/16 134/70   No changes needed Disc lifstyle change with low sodium diet and exercise  Labs reviewed

## 2016-08-26 NOTE — Assessment & Plan Note (Signed)
Lab Results  Component Value Date   HGBA1C 6.2 08/20/2016   disc imp of low glycemic diet and wt loss to prevent DM2

## 2016-08-26 NOTE — Assessment & Plan Note (Signed)
Disc goals for lipids and reasons to control them Rev labs with pt Rev low sat fat diet in detail  Tolerates low dose crestor-nl LFTs  Plans to watch diet more closely

## 2016-10-16 DIAGNOSIS — N1370 Vesicoureteral-reflux, unspecified: Secondary | ICD-10-CM | POA: Diagnosis not present

## 2016-10-16 DIAGNOSIS — N3020 Other chronic cystitis without hematuria: Secondary | ICD-10-CM | POA: Diagnosis not present

## 2017-03-04 ENCOUNTER — Other Ambulatory Visit: Payer: Self-pay | Admitting: Family Medicine

## 2017-03-04 DIAGNOSIS — Z1231 Encounter for screening mammogram for malignant neoplasm of breast: Secondary | ICD-10-CM

## 2017-04-05 ENCOUNTER — Ambulatory Visit
Admission: RE | Admit: 2017-04-05 | Discharge: 2017-04-05 | Disposition: A | Discharge status: Home / Self Care | Payer: PPO | Source: Ambulatory Visit | Attending: Family Medicine | Admitting: Family Medicine

## 2017-04-05 DIAGNOSIS — Z1231 Encounter for screening mammogram for malignant neoplasm of breast: Secondary | ICD-10-CM | POA: Diagnosis not present

## 2017-06-18 DIAGNOSIS — H2513 Age-related nuclear cataract, bilateral: Secondary | ICD-10-CM | POA: Diagnosis not present

## 2017-06-24 ENCOUNTER — Encounter: Payer: Self-pay | Admitting: Family Medicine

## 2017-06-24 ENCOUNTER — Ambulatory Visit (INDEPENDENT_AMBULATORY_CARE_PROVIDER_SITE_OTHER): Payer: PPO | Admitting: Family Medicine

## 2017-06-24 DIAGNOSIS — J069 Acute upper respiratory infection, unspecified: Secondary | ICD-10-CM | POA: Diagnosis not present

## 2017-06-24 MED ORDER — ALBUTEROL SULFATE HFA 108 (90 BASE) MCG/ACT IN AERS: 1.0000 | INHALATION_SPRAY | Freq: Four times a day (QID) | RESPIRATORY_TRACT | 0 refills | Status: DC | PRN

## 2017-06-24 MED ORDER — DOXYCYCLINE HYCLATE 100 MG PO TABS: 100.0000 mg | ORAL_TABLET | Freq: Two times a day (BID) | ORAL | 0 refills | Status: DC

## 2017-06-24 MED ORDER — HYDROCOD POLST-CPM POLST ER 10-8 MG/5ML PO SUER: 5.0000 mL | Freq: Two times a day (BID) | ORAL | 0 refills | Status: DC | PRN

## 2017-06-24 NOTE — Patient Instructions (Signed)
Presumed viral illness, no need to test for flu given the timeline and the improvement.  Use tussionex if needed, with sedation caution.  If worse, then start doxy and use the inhaler.  Update us as needed.  Take care.  Glad to see you.

## 2017-06-24 NOTE — Progress Notes (Signed)
Sx started about 06/20/17 with cold sx, with fever on the 15th.  Cough, with sputum, rhinorrhea. No vomiting.  No diarrhea.  No ear pain but stuffy.  No facial pain.  Some wheeze, has happened prev with other URIs.  No recent inhaler use/need.  Works with [facility].  Sick contacts noted at work.  She feels better now compared to yesterday and this AM, she had fever 101 this AM.  No tylenol since this AM.    Meds, vitals, and allergies reviewed.   ROS: Per HPI unless specifically indicated in ROS section   GEN: nad, alert and oriented HEENT: mucous membranes moist, tm w/o erythema, nasal exam w/o erythema, clear discharge noted,  OP with cobblestoning, sinuses not ttp  NECK: supple w/o LA CV: rrr.   PULM: ctab, no inc wob but dry cough noted.   EXT: no edema SKIN: well perfused.

## 2017-06-25 ENCOUNTER — Telehealth: Payer: Self-pay | Admitting: Family Medicine

## 2017-06-25 MED ORDER — ONDANSETRON HCL 4 MG PO TABS: 4.0000 mg | ORAL_TABLET | Freq: Three times a day (TID) | ORAL | 0 refills | Status: DC | PRN

## 2017-06-25 MED ORDER — AZITHROMYCIN 250 MG PO TABS
ORAL_TABLET | ORAL | 0 refills | Status: DC
Start: 2017-06-25 — End: 2017-08-28

## 2017-06-25 NOTE — Telephone Encounter (Signed)
1. She was doing better so the plan was to hold the abx, per our conversation.  2. What changed in the meantime?  Let me know and I'll see what I can do.

## 2017-06-25 NOTE — Telephone Encounter (Signed)
Copied from CRM [phone]. Topic: Quick Communication - See Telephone Encounter >> Jun 25, 2017  2:33 PM Clack, Princella PellegriniJessica D wrote: CRM for notification. See Telephone encounter for: Pt states she is not able to take doxycycline (VIBRA-TABS) 100 MG tablet [[id number]][[id number]], soon as she take it she start vomiting. She would like to know if something else can be called in.  [id number] [address]- [place], [place] - [address] [phone] (Phone) [phone] (Fax)    06/25/17.

## 2017-06-25 NOTE — Telephone Encounter (Signed)
Patient advised.

## 2017-06-25 NOTE — Telephone Encounter (Signed)
Patient sounded very congested.  Patient said she took the sheet of Rx's in to the pharmacy and because the Tussionex is a narcotic, they said they had to fill everything that was on the sheet, they couldn't hold anything, so that's why she went ahead and had it filled.  She did not take it though until this morning.  She ran a fever of 101 last night and it continued this morning so she took 1 Doxycycline and began relentless vomiting.  Please advise.

## 2017-06-25 NOTE — Telephone Encounter (Signed)
Stop doxy.  Don't take abx today.  Start zofran for vomiting.   If discolored sputum, sinus pain, or fever that persists then start zithromax tomorrow.   rxs sent.  I hope she feels better soon.  Thanks.  Update us as needed.

## 2017-06-26 DIAGNOSIS — J069 Acute upper respiratory infection, unspecified: Secondary | ICD-10-CM | POA: Insufficient documentation

## 2017-06-26 NOTE — Telephone Encounter (Signed)
Thanks

## 2017-06-26 NOTE — Assessment & Plan Note (Signed)
Nontoxic.  Okay for outpatient follow-up.  Discussed with patient about options. Presumed viral illness, no need to test for flu given the timeline and the improvement at time of OV.   Use tussionex if needed, with sedation caution.  If worse, then start doxy and use the inhaler.  Update us as needed.  See following phone notes.

## 2017-07-16 ENCOUNTER — Other Ambulatory Visit: Payer: Self-pay | Admitting: Family Medicine

## 2017-08-12 ENCOUNTER — Telehealth: Payer: Self-pay | Admitting: Family Medicine

## 2017-08-12 DIAGNOSIS — E7800 Pure hypercholesterolemia, unspecified: Secondary | ICD-10-CM

## 2017-08-12 DIAGNOSIS — I10 Essential (primary) hypertension: Secondary | ICD-10-CM

## 2017-08-12 DIAGNOSIS — R739 Hyperglycemia, unspecified: Secondary | ICD-10-CM

## 2017-08-12 NOTE — Telephone Encounter (Signed)
-----   Message from Terri J Walsh sent at 08/12/2017 11:17 AM EDT ----- Regarding: Lab orders for Wednesday, 4.17.19 Patient is scheduled for CPX labs, please order future labs, Thanks , Terri  

## 2017-08-21 ENCOUNTER — Other Ambulatory Visit (INDEPENDENT_AMBULATORY_CARE_PROVIDER_SITE_OTHER): Payer: PPO

## 2017-08-21 DIAGNOSIS — R739 Hyperglycemia, unspecified: Secondary | ICD-10-CM

## 2017-08-21 DIAGNOSIS — I10 Essential (primary) hypertension: Secondary | ICD-10-CM

## 2017-08-21 DIAGNOSIS — E7800 Pure hypercholesterolemia, unspecified: Secondary | ICD-10-CM

## 2017-08-21 LAB — COMPREHENSIVE METABOLIC PANEL
ALK PHOS: 72 U/L (ref 39–117)
ALT: 23 U/L (ref 0–35)
AST: 18 U/L (ref 0–37)
Albumin: 4.3 g/dL (ref 3.5–5.2)
BILIRUBIN TOTAL: 0.5 mg/dL (ref 0.2–1.2)
BUN: 16 mg/dL (ref 6–23)
CO2: 28 meq/L (ref 19–32)
Calcium: 9.4 mg/dL (ref 8.4–10.5)
Chloride: 103 mEq/L (ref 96–112)
Creatinine, Ser: 0.81 mg/dL (ref 0.40–1.20)
GFR: 75.06 mL/min (ref >60.00)
GLUCOSE: 105 mg/dL — AB (ref 70–99)
Potassium: 4.5 mEq/L (ref 3.5–5.1)
SODIUM: 140 meq/L (ref 135–145)
TOTAL PROTEIN: 6.8 g/dL (ref 6.0–8.3)

## 2017-08-21 LAB — CBC WITH DIFFERENTIAL/PLATELET
Basophils Absolute: 0 10*3/uL (ref 0.0–0.1)
Basophils Relative: 0.7 % (ref 0.0–3.0)
Eosinophils Absolute: 0.4 10*3/uL (ref 0.0–0.7)
Eosinophils Relative: 5.6 % — ABNORMAL HIGH (ref 0.0–5.0)
HCT: 40.9 % (ref 36.0–46.0)
Hemoglobin: 13.8 g/dL (ref 12.0–15.0)
LYMPHS ABS: 1.8 10*3/uL (ref 0.7–4.0)
Lymphocytes Relative: 26.2 % (ref 12.0–46.0)
MCHC: 33.6 g/dL (ref 30.0–36.0)
MCV: 90.8 fl (ref 78.0–100.0)
MONO ABS: 0.5 10*3/uL (ref 0.1–1.0)
MONOS PCT: 7 % (ref 3.0–12.0)
NEUTROS ABS: 4 10*3/uL (ref 1.4–7.7)
NEUTROS PCT: 60.5 % (ref 43.0–77.0)
PLATELETS: 249 10*3/uL (ref 150.0–400.0)
RBC: 4.51 Mil/uL (ref 3.87–5.11)
RDW: 13.7 % (ref 11.5–15.5)
WBC: 6.7 10*3/uL (ref 4.0–10.5)

## 2017-08-21 LAB — LIPID PANEL
Cholesterol: 175 mg/dL (ref 0–200)
HDL: 47.3 mg/dL (ref >39.00)
LDL Cholesterol: 95 mg/dL (ref 0–99)
NonHDL: 128.08
TRIGLYCERIDES: 163 mg/dL — AB (ref 0.0–149.0)
Total CHOL/HDL Ratio: 4
VLDL: 32.6 mg/dL (ref 0.0–40.0)

## 2017-08-21 LAB — HEMOGLOBIN A1C: HEMOGLOBIN A1C: 6.2 % (ref 4.6–6.5)

## 2017-08-21 LAB — TSH: TSH: 2.62 u[IU]/mL (ref 0.35–4.50)

## 2017-08-28 ENCOUNTER — Encounter: Payer: Self-pay | Admitting: Family Medicine

## 2017-08-28 ENCOUNTER — Ambulatory Visit (INDEPENDENT_AMBULATORY_CARE_PROVIDER_SITE_OTHER): Payer: PPO | Admitting: Family Medicine

## 2017-08-28 VITALS — BP 116/68 | HR 76 | Temp 98.4°F | Ht 63.0 in | Wt 189.5 lb

## 2017-08-28 DIAGNOSIS — Z6833 Body mass index (BMI) 33.0-33.9, adult: Secondary | ICD-10-CM

## 2017-08-28 DIAGNOSIS — R739 Hyperglycemia, unspecified: Secondary | ICD-10-CM | POA: Diagnosis not present

## 2017-08-28 DIAGNOSIS — E7800 Pure hypercholesterolemia, unspecified: Secondary | ICD-10-CM

## 2017-08-28 DIAGNOSIS — I10 Essential (primary) hypertension: Secondary | ICD-10-CM | POA: Diagnosis not present

## 2017-08-28 DIAGNOSIS — E2839 Other primary ovarian failure: Secondary | ICD-10-CM | POA: Insufficient documentation

## 2017-08-28 DIAGNOSIS — Z23 Encounter for immunization: Secondary | ICD-10-CM | POA: Diagnosis not present

## 2017-08-28 DIAGNOSIS — E6609 Other obesity due to excess calories: Secondary | ICD-10-CM | POA: Diagnosis not present

## 2017-08-28 DIAGNOSIS — Z0000 Encounter for general adult medical examination without abnormal findings: Secondary | ICD-10-CM | POA: Insufficient documentation

## 2017-08-28 MED ORDER — DICLOFENAC SODIUM ER 100 MG PO TB24: 100.0000 mg | ORAL_TABLET | Freq: Every day | ORAL | 11 refills | Status: DC

## 2017-08-28 MED ORDER — ATENOLOL 25 MG PO TABS: 25.0000 mg | ORAL_TABLET | Freq: Every day | ORAL | 11 refills | Status: DC

## 2017-08-28 MED ORDER — ROSUVASTATIN CALCIUM 5 MG PO TABS: ORAL_TABLET | ORAL | 11 refills | Status: DC

## 2017-08-28 NOTE — Patient Instructions (Addendum)
Think about getting a tetanus shot  If you are interested in the new shingles vaccine (Shingrix) - call your local pharmacy to check on coverage and availability  Get on a waiting list for it if you can   Please work on an advance directive   prevnar immunization today  To prevent diabetes please  Try to get most of your carbohydrates from produce (with the exception of white potatoes)  Eat less bread/pasta/rice/snack foods/cereals/sweets and other items from the middle of the grocery store (processed carbs)

## 2017-08-28 NOTE — Progress Notes (Signed)
Subjective:    Patient ID: Amy Mills, female    DOB: 09/04/1950, 67 y.o.   MRN: [id number]  HPI I have personally reviewed the Medicare Annual Wellness questionnaire and have noted 1. The patient's medical and social history 2. Their use of alcohol, tobacco or illicit drugs 3. Their current medications and supplements 4. The patient's functional ability including ADL's, fall risks, home safety risks and hearing or visual             impairment. 5. Diet and physical activities 6. Evidence for depression or mood disorders  The patients weight, height, BMI have been recorded in the chart and visual acuity is per eye clinic.  I have made referrals, counseling and provided education to the patient based review of the above and I have provided the pt with a written personalized care plan for preventive services. Reviewed and updated provider list, see scanned forms.  Very busy Taking care of herself    See scanned forms.  Routine anticipatory guidance given to patient.  See health maintenance. Hepatitis C screening  Colon cancer screening colonoscopy 1/12 with 10 y recall  Breast cancer screening mammogram 11/18 negative  Self breast exam-no lumps  Flu vaccine-declines  Tetanus vaccine 1/07-info given re: getting this out of office  Pneumovax -will get prevnar today  Zoster vaccine- interested in shingrix  dexa 5/09-normal - interested in another one  Mother had broken both hips  Falls-none  Fracture history -none in adulthood  Advance directive-has not worked on it yet-needs another Engineer, production function addressed- see scanned forms- and if abnormal then additional documentation follows. -no changes/ no problems since last time  occ walks into a room and forgets why   PMH and SH reviewed  Meds, vitals, and allergies reviewed.   ROS: See HPI.  Otherwise negative.    Wt Readings from Last 3 Encounters:  08/28/17 189 lb 8 oz (86 kg)  06/24/17 190 lb (86.2 kg)    08/24/16 189 lb (85.7 kg)  eating better-smaller portions  Exercise-walking and aerobics and some stretching and toning  Working hard at it  33.57 kg/m    Hearing Screening   125Hz  250Hz  500Hz  1000Hz  2000Hz  3000Hz  4000Hz  6000Hz  8000Hz   Right ear:   40 40 40  40    Left ear:   40 40 40  40    Vision Screening Comments: Pt had eye exam in Feb 2019 with Dr. Dione Booze  bp is stable today  No cp or palpitations or headaches or edema  No side effects to medicines  BP Readings from Last 3 Encounters:  08/28/17 116/68  06/24/17 128/70  08/24/16 134/70     Hyperglycemia Lab Results  Component Value Date   HGBA1C 6.2 08/21/2017  this is stable    Hyperlipidemia Lab Results  Component Value Date   CHOL 175 08/21/2017   CHOL 190 08/20/2016   CHOL 175 06/09/2015   Lab Results  Component Value Date   HDL 47.30 08/21/2017   HDL 40.60 08/20/2016   HDL 40 (L) 06/09/2015   Lab Results  Component Value Date   LDLCALC 95 08/21/2017   LDLCALC 110 (H) 08/20/2016   LDLCALC 107 06/09/2015   Lab Results  Component Value Date   TRIG 163.0 (H) 08/21/2017   TRIG 193.0 (H) 08/20/2016   TRIG 140 06/09/2015   Lab Results  Component Value Date   CHOLHDL 4 08/21/2017   CHOLHDL 5 08/20/2016   CHOLHDL 4.4 06/09/2015  Lab Results  Component Value Date   LDLDIRECT 212.8 07/02/2006   crestor and diet  Eating better -making progress with LDL   Results for orders placed or performed in visit on 08/21/17  TSH  Result Value Ref Range   TSH 2.62 0.35 - 4.50 uIU/mL  Lipid panel  Result Value Ref Range   Cholesterol 175 0 - 200 mg/dL   Triglycerides 161.0 (H) 0.0 - 149.0 mg/dL   HDL 96.04 >54.09 mg/dL   VLDL 81.1 0.0 - 91.4 mg/dL   LDL Cholesterol 95 0 - 99 mg/dL   Total CHOL/HDL Ratio 4    NonHDL 128.08   Hemoglobin A1c  Result Value Ref Range   Hgb A1c MFr Bld 6.2 4.6 - 6.5 %  Comprehensive metabolic panel  Result Value Ref Range   Sodium 140 135 - 145 mEq/L   Potassium 4.5  3.5 - 5.1 mEq/L   Chloride 103 96 - 112 mEq/L   CO2 28 19 - 32 mEq/L   Glucose, Bld 105 (H) 70 - 99 mg/dL   BUN 16 6 - 23 mg/dL   Creatinine, Ser 7.82 0.40 - 1.20 mg/dL   Total Bilirubin 0.5 0.2 - 1.2 mg/dL   Alkaline Phosphatase 72 39 - 117 U/L   AST 18 0 - 37 U/L   ALT 23 0 - 35 U/L   Total Protein 6.8 6.0 - 8.3 g/dL   Albumin 4.3 3.5 - 5.2 g/dL   Calcium 9.4 8.4 - 95.6 mg/dL   GFR 21.30 >86.57 mL/min  CBC with Differential/Platelet  Result Value Ref Range   WBC 6.7 4.0 - 10.5 K/uL   RBC 4.51 3.87 - 5.11 Mil/uL   Hemoglobin 13.8 12.0 - 15.0 g/dL   HCT 84.6 96.2 - 95.2 %   MCV 90.8 78.0 - 100.0 fl   MCHC 33.6 30.0 - 36.0 g/dL   RDW 84.1 32.4 - 40.1 %   Platelets 249.0 150.0 - 400.0 K/uL   Neutrophils Relative % 60.5 43.0 - 77.0 %   Lymphocytes Relative 26.2 12.0 - 46.0 %   Monocytes Relative 7.0 3.0 - 12.0 %   Eosinophils Relative 5.6 (H) 0.0 - 5.0 %   Basophils Relative 0.7 0.0 - 3.0 %   Neutro Abs 4.0 1.4 - 7.7 K/uL   Lymphs Abs 1.8 0.7 - 4.0 K/uL   Monocytes Absolute 0.5 0.1 - 1.0 K/uL   Eosinophils Absolute 0.4 0.0 - 0.7 K/uL   Basophils Absolute 0.0 0.0 - 0.1 K/uL     Patient Active Problem List   Diagnosis Date Noted  . Medicare annual wellness visit, subsequent 08/28/2017  . Estrogen deficiency 08/28/2017  . Welcome to Medicare preventive visit 08/26/2016  . Hyperglycemia 06/13/2015  . Obesity 06/13/2015  . Routine general medical examination at a health care facility 09/14/2011  . STRESS REACTION, ACUTE, WITH EMOTIONAL DISTURBANCE 05/12/2008  . HYPERCHOLESTEROLEMIA 04/21/2007  . Essential hypertension 04/21/2007  . UTI'S, RECURRENT 04/21/2007  . FIBROMYALGIA 04/21/2007  . RETRACTION/LAG, EYELID 09/20/2006  . PARESTHESIA 09/20/2006  . MRI, BRAIN, ABNORMAL 09/20/2006   Past Medical History:  Diagnosis Date  . Anxiety reaction   . Back pain   . Fibromyalgia   . History of recurrent UTIs   . HLD (hyperlipidemia)   . Hypertension    Past Surgical  History:  Procedure Laterality Date  . ABDOMINAL HYSTERECTOMY    . OVARIAN CYST REMOVAL    . TONSILLECTOMY     Social History   Tobacco Use  .  Smoking status: Never Smoker  . Smokeless tobacco: Never Used  Substance Use Topics  . Alcohol use: Yes    Alcohol/week: 0.0 oz    Comment: rare  . Drug use: No   Family History  Problem Relation Age of Onset  . Thyroid nodules Mother   . Heart disease Mother   . Hypertension Father   . Dementia Father   . Alcohol abuse Brother   . Hyperlipidemia Daughter   . Hypertension Daughter   . Cancer Maternal Aunt        breast  . Breast cancer Maternal Aunt   . Cancer Paternal Aunt        breast  . Breast cancer Paternal Aunt    Allergies  Allergen Reactions  . Amoxicillin-Pot Clavulanate Diarrhea    Diarrhea   . Doxycycline Other (See Comments)    vomiting  . Nitrofurantoin     REACTION: hives   Current Outpatient Medications on File Prior to Visit  Medication Sig Dispense Refill  . Multiple Vitamins-Minerals (WOMENS MULTI PO) Take 1 tablet by mouth daily.      . SUMAtriptan (IMITREX) 50 MG tablet TAKE 1 TABLET EVERY 2 HOURS AS NEEDED FOR MIGRAINE--MAX 2 DOSES PER DAY 10 tablet 11  . trimethoprim (TRIMPEX) 100 MG tablet Take 100 mg by mouth daily.      No current facility-administered medications on file prior to visit.     Review of Systems  Constitutional: Negative for activity change, appetite change, fatigue, fever and unexpected weight change.  HENT: Negative for congestion, ear pain, rhinorrhea, sinus pressure and sore throat.   Eyes: Negative for pain, redness and visual disturbance.  Respiratory: Negative for cough, shortness of breath and wheezing.   Cardiovascular: Negative for chest pain and palpitations.  Gastrointestinal: Negative for abdominal pain, blood in stool, constipation and diarrhea.  Endocrine: Negative for polydipsia and polyuria.  Genitourinary: Negative for dysuria, frequency and urgency.    Musculoskeletal: Negative for arthralgias, back pain and myalgias.  Skin: Negative for pallor and rash.  Allergic/Immunologic: Negative for environmental allergies.  Neurological: Negative for dizziness, syncope and headaches.  Hematological: Negative for adenopathy. Does not bruise/bleed easily.  Psychiatric/Behavioral: Negative for decreased concentration and dysphoric mood. The patient is not nervous/anxious.        Objective:   Physical Exam  Constitutional: She appears well-developed and well-nourished. No distress.  obese and well appearing   HENT:  Head: Normocephalic and atraumatic.  Right Ear: External ear normal.  Left Ear: External ear normal.  Mouth/Throat: Oropharynx is clear and moist.  Eyes: Pupils are equal, round, and reactive to light. Conjunctivae and EOM are normal. No scleral icterus.  Neck: Normal range of motion. Neck supple. No JVD present. Carotid bruit is not present. No thyromegaly present.  Cardiovascular: Normal rate, regular rhythm, normal heart sounds and intact distal pulses. Exam reveals no gallop.  Pulmonary/Chest: Effort normal and breath sounds normal. No respiratory distress. She has no wheezes. She exhibits no tenderness. No breast swelling, tenderness, discharge or bleeding.  Abdominal: Soft. Bowel sounds are normal. She exhibits no distension, no abdominal bruit and no mass. There is no tenderness.  Genitourinary: No breast swelling, tenderness, discharge or bleeding.  Genitourinary Comments: Breast exam: No mass, nodules, thickening, tenderness, bulging, retraction, inflamation, nipple discharge or skin changes noted.  No axillary or clavicular LA.      Musculoskeletal: Normal range of motion. She exhibits no edema or tenderness.  No kyphosis   Lymphadenopathy:    She  has no cervical adenopathy.  Neurological: She is alert. She has normal reflexes. No cranial nerve deficit. She exhibits normal muscle tone. Coordination normal.  Skin: Skin is  warm and dry. No rash noted. No erythema. No pallor.  Fluid filled cyst on top of L 3rd toe  Solar lentigines diffusely   Psychiatric: She has a normal mood and affect.          Assessment & Plan:   Problem List Items Addressed This Visit      Cardiovascular and Mediastinum   Essential hypertension    . bp in fair control at this time  BP Readings from Last 1 Encounters:  08/28/17 116/68   No changes needed Disc lifstyle change with low sodium diet and exercise  Labs rev       Relevant Medications   atenolol (TENORMIN) 25 MG tablet   rosuvastatin (CRESTOR) 5 MG tablet     Other   Estrogen deficiency    Ref for dexa  fam hx of OP      Relevant Orders   DG Bone Density   HYPERCHOLESTEROLEMIA    Disc goals for lipids and reasons to control them Rev labs with pt Rev low sat fat diet in detail Well controlled with crestor and diet       Relevant Medications   atenolol (TENORMIN) 25 MG tablet   rosuvastatin (CRESTOR) 5 MG tablet   Hyperglycemia    Lab Results  Component Value Date   HGBA1C 6.2 08/21/2017   Stable disc imp of low glycemic diet and wt loss to prevent DM2       Medicare annual wellness visit, subsequent - Primary    Reviewed health habits including diet and exercise and skin cancer prevention Reviewed appropriate screening tests for age  Also reviewed health mt list, fam hx and immunization status , as well as social and family history   See HPI Labs reviewed  Disc shingrix prevnar given  Urged to consider getting tetanus shot out of office since it is not covered by medicare Ref for screening dexa and disc imp of ca and D Enc fitness and wt loss        Obesity    Discussed how this problem influences overall health and the risks it imposes  Reviewed plan for weight loss with lower calorie diet (via better food choices and also portion control or program like weight watchers) and exercise building up to or more than 30 minutes 5 days  per week including some aerobic activity         Routine general medical examination at a health care facility    Reviewed health habits including diet and exercise and skin cancer prevention Reviewed appropriate screening tests for age  Also reviewed health mt list, fam hx and immunization status , as well as social and family history   See HPI Labs reviewed  Disc shingrix prevnar given  Urged to consider getting tetanus shot out of office since it is not covered by medicare Ref for screening dexa and disc imp of ca and D Enc fitness and wt loss        Other Visit Diagnoses    Need for vaccination with 13-polyvalent pneumococcal conjugate vaccine       Relevant Orders   Pneumococcal conjugate vaccine 13-valent (Completed)

## 2017-08-29 NOTE — Assessment & Plan Note (Signed)
Lab Results  Component Value Date   HGBA1C 6.2 08/21/2017   Stable disc imp of low glycemic diet and wt loss to prevent DM2

## 2017-08-29 NOTE — Assessment & Plan Note (Signed)
.   bp in fair control at this time  BP Readings from Last 1 Encounters:  08/28/17 116/68   No changes needed Disc lifstyle change with low sodium diet and exercise  Labs rev

## 2017-08-29 NOTE — Assessment & Plan Note (Signed)
Discussed how this problem influences overall health and the risks it imposes  Reviewed plan for weight loss with lower calorie diet (via better food choices and also portion control or program like weight watchers) and exercise building up to or more than 30 minutes 5 days per week including some aerobic activity    

## 2017-08-29 NOTE — Assessment & Plan Note (Signed)
Ref for dexa  fam hx of OP

## 2017-08-29 NOTE — Assessment & Plan Note (Signed)
Reviewed health habits including diet and exercise and skin cancer prevention Reviewed appropriate screening tests for age  Also reviewed health mt list, fam hx and immunization status , as well as social and family history   See HPI Labs reviewed  Disc shingrix prevnar given  Urged to consider getting tetanus shot out of office since it is not covered by medicare Ref for screening dexa and disc imp of ca and D Enc fitness and wt loss   

## 2017-08-29 NOTE — Assessment & Plan Note (Signed)
Reviewed health habits including diet and exercise and skin cancer prevention Reviewed appropriate screening tests for age  Also reviewed health mt list, fam hx and immunization status , as well as social and family history   See HPI Labs reviewed  Disc shingrix prevnar given  Urged to consider getting tetanus shot out of office since it is not covered by medicare Ref for screening dexa and disc imp of ca and D Enc fitness and wt loss

## 2017-08-29 NOTE — Assessment & Plan Note (Signed)
Disc goals for lipids and reasons to control them Rev labs with pt Rev low sat fat diet in detail Well controlled with crestor and diet  

## 2017-10-17 ENCOUNTER — Ambulatory Visit
Admission: RE | Admit: 2017-10-17 | Discharge: 2017-10-17 | Disposition: A | Discharge status: Home / Self Care | Payer: PPO | Source: Ambulatory Visit | Attending: Family Medicine | Admitting: Family Medicine

## 2017-10-17 DIAGNOSIS — Z780 Asymptomatic menopausal state: Secondary | ICD-10-CM | POA: Diagnosis not present

## 2017-10-17 DIAGNOSIS — Z13820 Encounter for screening for osteoporosis: Secondary | ICD-10-CM | POA: Diagnosis not present

## 2017-10-17 DIAGNOSIS — E2839 Other primary ovarian failure: Secondary | ICD-10-CM

## 2017-12-31 DIAGNOSIS — N3020 Other chronic cystitis without hematuria: Secondary | ICD-10-CM | POA: Diagnosis not present

## 2018-04-01 ENCOUNTER — Other Ambulatory Visit: Payer: Self-pay | Admitting: Family Medicine

## 2018-04-01 DIAGNOSIS — Z1231 Encounter for screening mammogram for malignant neoplasm of breast: Secondary | ICD-10-CM

## 2018-04-15 ENCOUNTER — Ambulatory Visit
Admission: RE | Admit: 2018-04-15 | Discharge: 2018-04-15 | Disposition: A | Discharge status: Home / Self Care | Payer: PPO | Source: Ambulatory Visit | Attending: Family Medicine | Admitting: Family Medicine

## 2018-04-15 DIAGNOSIS — Z1231 Encounter for screening mammogram for malignant neoplasm of breast: Secondary | ICD-10-CM

## 2018-04-16 ENCOUNTER — Other Ambulatory Visit: Payer: Self-pay | Admitting: *Deleted

## 2018-04-16 MED ORDER — SUMATRIPTAN SUCCINATE 50 MG PO TABS: ORAL_TABLET | ORAL | 11 refills | Status: DC

## 2018-04-16 NOTE — Telephone Encounter (Signed)
CPE 08/28/17, last filled on 08/24/16 #10 tabs with 11 refills

## 2018-06-13 ENCOUNTER — Telehealth: Payer: Self-pay | Admitting: Family Medicine

## 2018-06-13 DIAGNOSIS — R7303 Prediabetes: Secondary | ICD-10-CM

## 2018-06-13 DIAGNOSIS — Z1159 Encounter for screening for other viral diseases: Secondary | ICD-10-CM | POA: Insufficient documentation

## 2018-06-13 DIAGNOSIS — E7800 Pure hypercholesterolemia, unspecified: Secondary | ICD-10-CM

## 2018-06-13 DIAGNOSIS — I10 Essential (primary) hypertension: Secondary | ICD-10-CM

## 2018-06-13 NOTE — Telephone Encounter (Signed)
Pt called and scheduled her CPE and labs for April. Pt is requesting Dr.Tower to put in a order for her to get Hepatitis C screening done as well.

## 2018-06-13 NOTE — Telephone Encounter (Signed)
Pt notified lab orders are done.

## 2018-06-13 NOTE — Telephone Encounter (Signed)
Orders done

## 2018-06-23 DIAGNOSIS — H2513 Age-related nuclear cataract, bilateral: Secondary | ICD-10-CM | POA: Diagnosis not present

## 2018-08-26 ENCOUNTER — Other Ambulatory Visit: Payer: PPO

## 2018-09-01 ENCOUNTER — Other Ambulatory Visit: Payer: Self-pay | Admitting: Family Medicine

## 2018-09-02 ENCOUNTER — Other Ambulatory Visit: Payer: Self-pay | Admitting: Family Medicine

## 2018-09-02 ENCOUNTER — Encounter: Payer: PPO | Admitting: Family Medicine

## 2018-09-03 NOTE — Telephone Encounter (Signed)
Last OV 08/28/2017   Last refilled 08/28/2017 disp 30 with 11 refills   Next OV 12/09/2018  Sent to PCP for approval

## 2018-09-03 NOTE — Telephone Encounter (Signed)
I spoke to patient and she said she wasn't having any problems and would rather wait until summer,so she can see Dr.Tower in person.

## 2018-09-03 NOTE — Telephone Encounter (Signed)
She has healthteam adv and is due for her amw and yearly f/u  If she would rather do this now virtually rather than wait until summer for office f/u  please offer that to her

## 2018-11-30 ENCOUNTER — Other Ambulatory Visit: Payer: Self-pay | Admitting: Family Medicine

## 2018-12-01 ENCOUNTER — Other Ambulatory Visit: Payer: Self-pay

## 2018-12-01 ENCOUNTER — Other Ambulatory Visit (INDEPENDENT_AMBULATORY_CARE_PROVIDER_SITE_OTHER): Payer: PPO

## 2018-12-01 DIAGNOSIS — Z1159 Encounter for screening for other viral diseases: Secondary | ICD-10-CM

## 2018-12-01 DIAGNOSIS — E7800 Pure hypercholesterolemia, unspecified: Secondary | ICD-10-CM | POA: Diagnosis not present

## 2018-12-01 DIAGNOSIS — R7303 Prediabetes: Secondary | ICD-10-CM | POA: Diagnosis not present

## 2018-12-01 DIAGNOSIS — I10 Essential (primary) hypertension: Secondary | ICD-10-CM | POA: Diagnosis not present

## 2018-12-01 LAB — LIPID PANEL
Cholesterol: 167 mg/dL (ref 0–200)
HDL: 45.1 mg/dL (ref >39.00)
LDL Cholesterol: 98 mg/dL (ref 0–99)
NonHDL: 122.34
Total CHOL/HDL Ratio: 4
Triglycerides: 121 mg/dL (ref 0.0–149.0)
VLDL: 24.2 mg/dL (ref 0.0–40.0)

## 2018-12-01 LAB — CBC WITH DIFFERENTIAL/PLATELET
Basophils Absolute: 0.1 10*3/uL (ref 0.0–0.1)
Basophils Relative: 0.9 % (ref 0.0–3.0)
Eosinophils Absolute: 0.4 10*3/uL (ref 0.0–0.7)
Eosinophils Relative: 5.3 % — ABNORMAL HIGH (ref 0.0–5.0)
HCT: 40.6 % (ref 36.0–46.0)
Hemoglobin: 13.3 g/dL (ref 12.0–15.0)
Lymphocytes Relative: 26.6 % (ref 12.0–46.0)
Lymphs Abs: 1.8 10*3/uL (ref 0.7–4.0)
MCHC: 32.8 g/dL (ref 30.0–36.0)
MCV: 92.9 fl (ref 78.0–100.0)
Monocytes Absolute: 0.5 10*3/uL (ref 0.1–1.0)
Monocytes Relative: 7.9 % (ref 3.0–12.0)
Neutro Abs: 4.1 10*3/uL (ref 1.4–7.7)
Neutrophils Relative %: 59.3 % (ref 43.0–77.0)
Platelets: 234 10*3/uL (ref 150.0–400.0)
RBC: 4.36 Mil/uL (ref 3.87–5.11)
RDW: 13 % (ref 11.5–15.5)
WBC: 6.9 10*3/uL (ref 4.0–10.5)

## 2018-12-01 LAB — COMPREHENSIVE METABOLIC PANEL
ALT: 24 U/L (ref 0–35)
AST: 22 U/L (ref 0–37)
Albumin: 4.5 g/dL (ref 3.5–5.2)
Alkaline Phosphatase: 76 U/L (ref 39–117)
BUN: 23 mg/dL (ref 6–23)
CO2: 27 mEq/L (ref 19–32)
Calcium: 9.5 mg/dL (ref 8.4–10.5)
Chloride: 105 mEq/L (ref 96–112)
Creatinine, Ser: 0.87 mg/dL (ref 0.40–1.20)
GFR: 64.78 mL/min (ref >60.00)
Glucose, Bld: 92 mg/dL (ref 70–99)
Potassium: 4.3 mEq/L (ref 3.5–5.1)
Sodium: 141 mEq/L (ref 135–145)
Total Bilirubin: 0.5 mg/dL (ref 0.2–1.2)
Total Protein: 6.8 g/dL (ref 6.0–8.3)

## 2018-12-01 LAB — HEMOGLOBIN A1C: Hgb A1c MFr Bld: 5.9 % (ref 4.6–6.5)

## 2018-12-01 LAB — TSH: TSH: 2.43 u[IU]/mL (ref 0.35–4.50)

## 2018-12-01 NOTE — Telephone Encounter (Signed)
CPE scheduled 12/09/18, last filled on 09/03/18 #90 tabs with 0 refills

## 2018-12-02 LAB — HEPATITIS C ANTIBODY
Hepatitis C Ab: NONREACTIVE
SIGNAL TO CUT-OFF: 0.01 (ref <1.00)

## 2018-12-09 ENCOUNTER — Encounter: Payer: Self-pay | Admitting: Family Medicine

## 2018-12-09 ENCOUNTER — Ambulatory Visit (INDEPENDENT_AMBULATORY_CARE_PROVIDER_SITE_OTHER): Payer: PPO | Admitting: Family Medicine

## 2018-12-09 VITALS — Ht 63.0 in | Wt 172.0 lb

## 2018-12-09 DIAGNOSIS — E7800 Pure hypercholesterolemia, unspecified: Secondary | ICD-10-CM

## 2018-12-09 DIAGNOSIS — I10 Essential (primary) hypertension: Secondary | ICD-10-CM | POA: Diagnosis not present

## 2018-12-09 DIAGNOSIS — Z0000 Encounter for general adult medical examination without abnormal findings: Secondary | ICD-10-CM | POA: Diagnosis not present

## 2018-12-09 DIAGNOSIS — Z6833 Body mass index (BMI) 33.0-33.9, adult: Secondary | ICD-10-CM | POA: Diagnosis not present

## 2018-12-09 DIAGNOSIS — E6609 Other obesity due to excess calories: Secondary | ICD-10-CM

## 2018-12-09 DIAGNOSIS — N390 Urinary tract infection, site not specified: Secondary | ICD-10-CM

## 2018-12-09 DIAGNOSIS — R7303 Prediabetes: Secondary | ICD-10-CM | POA: Diagnosis not present

## 2018-12-09 MED ORDER — TRIMETHOPRIM 100 MG PO TABS: 100.0000 mg | ORAL_TABLET | Freq: Every day | ORAL | 3 refills | Status: DC

## 2018-12-09 NOTE — Assessment & Plan Note (Signed)
bp in fair control at this time  BP Readings from Last 1 Encounters:  08/28/17 116/68   No changes needed Most recent labs reviewed  Disc lifstyle change with low sodium diet and exercise  bp has also been well controlled at home

## 2018-12-09 NOTE — Assessment & Plan Note (Signed)
Disc goals for lipids and reasons to control them Rev last labs with pt Rev low sat fat diet in detail Well controlled with statin /crestor and diet Trig down with better diet

## 2018-12-09 NOTE — Patient Instructions (Addendum)
You are due for a pneumonia vaccine (pna 23) the next time you are here  Get a flu shot in the fall (consider)   I refilled trimethoprim   Stay active  Be careful not to fall  Yoga is good for balance

## 2018-12-09 NOTE — Assessment & Plan Note (Signed)
Lab Results  Component Value Date   HGBA1C 5.9 12/01/2018   Improved with wt loss and better diet/exercise disc imp of low glycemic diet and wt loss to prevent DM2

## 2018-12-09 NOTE — Assessment & Plan Note (Signed)
Commended wt loss so far with NOOM program and healthy lifestyle Discussed how this problem influences overall health and the risks it imposes  Reviewed plan for weight loss with lower calorie diet (via better food choices and also portion control or program like weight watchers) and exercise building up to or more than 30 minutes 5 days per week including some aerobic activity

## 2018-12-09 NOTE — Assessment & Plan Note (Signed)
Reviewed health habits including diet and exercise and skin cancer prevention Reviewed appropriate screening tests for age  Also reviewed health mt list, fam hx and immunization status , as well as social and family history   See HPI Labs reviewed  Commended on wt loss doing well  Noted fall and fall prevention was discussed PHQ score of 0 Due for tetanus shot if she has an injury (medicare does not cover) Due for pna 23 vaccine-she wants to wait until her next visit  Also enc strongly to get a flu shot (in light of covid pandemic)-she will think about it  Enc ca and D for bone health  Continued exercise also enc

## 2018-12-09 NOTE — Progress Notes (Signed)
Virtual Visit via Video Note  I connected with Kerry FortJanet C Sessler on 12/09/18 at  3:15 PM EDT by a video enabled telemedicine application and verified that I am speaking with the correct person using two identifiers.  Location: Patient: home Provider: office    I discussed the limitations of evaluation and management by telemedicine and the availability of in person appointments. The patient expressed understanding and agreed to proceed.  History of Present Illness: Here for health maintenance exam and to review chronic medical problems    Glad to be back to work -today  Has spent time with grandkids Also some painting projects   occ aches and pains   Feeling fine -nothing new   Tetanus shot 1/07 Td  pna vaccine 4/19 had prevnar  Needs pna 23 - will wait until the next time you are here   Mammogram 12/19 Self breast exam -no breast lumps  Colonoscopy 1/12  dexa 6/19 -normal bmd  Falls-slipped on water in the kitchen- hit her head /hurt her shoulder about 3 months ago  Shoulder still hurts a bit  Fractures- no fractures  Supplements -takes vitamin D3  Exercise -walking  Weight  172 lb Wt Readings from Last 3 Encounters:  12/09/18 172 lb (78 kg)  08/28/17 189 lb 8 oz (86 kg)  06/24/17 190 lb (86.2 kg)   30.47 kg/m  lost weight doing the noom program and really likes it  Good life skills  Eating better  Doing a lot of walking (when not too hot)    Hearing/vision -no concerns  Sees eye doctor once yearly  Has early cataracts   bp has been stable  No cp or palpitations or headaches or edema  No side effects to medicines  BP Readings from Last 3 Encounters:  08/28/17 116/68  06/24/17 128/70  08/24/16 134/70    Good at home also   H/o recurrent uti  Urologist had her on proph trimethoprim- Dr Jacquelyne Balintannenbaum  Saw his partner last year  This helps quite a bit  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  No kidney stones  Last US was 2-3 years ago  No side effects    Lab Results  Component Value Date   CREATININE 0.87 12/01/2018   BUN 23 12/01/2018   NA 141 12/01/2018   K 4.3 12/01/2018   CL 105 12/01/2018   CO2 27 12/01/2018   Lab Results  Component Value Date   ALT 24 12/01/2018   AST 22 12/01/2018   ALKPHOS 76 12/01/2018   BILITOT 0.5 12/01/2018   Lab Results  Component Value Date   WBC 6.9 12/01/2018   HGB 13.3 12/01/2018   HCT 40.6 12/01/2018   MCV 92.9 12/01/2018   PLT 234.0 12/01/2018    Lab Results  Component Value Date   TSH 2.43 12/01/2018      Hyperlipidemia Lab Results  Component Value Date   CHOL 167 12/01/2018   CHOL 175 08/21/2017   CHOL 190 08/20/2016   Lab Results  Component Value Date   HDL 45.10 12/01/2018   HDL 47.30 08/21/2017   HDL 40.60 08/20/2016   Lab Results  Component Value Date   LDLCALC 98 12/01/2018   LDLCALC 95 08/21/2017   LDLCALC 110 (H) 08/20/2016   Lab Results  Component Value Date   TRIG 121.0 12/01/2018   TRIG 163.0 (H) 08/21/2017   TRIG 193.0 (H) 08/20/2016   Lab Results  Component Value Date   CHOLHDL 4 12/01/2018  CHOLHDL 4 08/21/2017   CHOLHDL 5 08/20/2016   Lab Results  Component Value Date   LDLDIRECT 212.8 07/02/2006  trig down  In good control with crestor and diet    Prediabetes Lab Results  Component Value Date   HGBA1C 5.9 12/01/2018  lost weight - less processed carbs  Also exercising  Down from 6.2   PHQ 2: score of 0   Patient Active Problem List   Diagnosis Date Noted  . Encounter for hepatitis C screening test for low risk patient 06/13/2018  . Medicare annual wellness visit, subsequent 08/28/2017  . Estrogen deficiency 08/28/2017  . Prediabetes 06/13/2015  . Obesity 06/13/2015  . Routine general medical examination at a health care facility 09/14/2011  . STRESS REACTION, ACUTE, WITH EMOTIONAL DISTURBANCE 05/12/2008  . HYPERCHOLESTEROLEMIA 04/21/2007  . Essential hypertension 04/21/2007  . UTI'S, RECURRENT 04/21/2007  . FIBROMYALGIA  04/21/2007  . RETRACTION/LAG, EYELID 09/20/2006  . PARESTHESIA 09/20/2006  . MRI, BRAIN, ABNORMAL 09/20/2006   Past Medical History:  Diagnosis Date  . Anxiety reaction   . Back pain   . Fibromyalgia   . History of recurrent UTIs   . HLD (hyperlipidemia)   . Hypertension    Past Surgical History:  Procedure Laterality Date  . ABDOMINAL HYSTERECTOMY    . OVARIAN CYST REMOVAL    . TONSILLECTOMY     Social History   Tobacco Use  . Smoking status: Never Smoker  . Smokeless tobacco: Never Used  Substance Use Topics  . Alcohol use: Yes    Alcohol/week: 0.0 standard drinks    Comment: rare  . Drug use: No   Family History  Problem Relation Age of Onset  . Thyroid nodules Mother   . Heart disease Mother   . Hypertension Father   . Dementia Father   . Alcohol abuse Brother   . Hyperlipidemia Daughter   . Hypertension Daughter   . Cancer Maternal Aunt        breast  . Breast cancer Maternal Aunt   . Cancer Paternal Aunt        breast  . Breast cancer Paternal Aunt    Allergies  Allergen Reactions  . Amoxicillin-Pot Clavulanate Diarrhea    Diarrhea   . Doxycycline Other (See Comments)    vomiting  . Nitrofurantoin     REACTION: hives   Current Outpatient Medications on File Prior to Visit  Medication Sig Dispense Refill  . APPLE CIDER VINEGAR PO Take 1 capsule by mouth daily.    Marland Kitchen atenolol (TENORMIN) 25 MG tablet TAKE 1 TABLET BY MOUTH EVERY DAY 90 tablet 1  . Cholecalciferol (D3-1000 PO) Take 1 tablet by mouth daily.    . Diclofenac Sodium CR 100 MG 24 hr tablet TAKE 1 TABLET BY MOUTH EVERY DAY 90 tablet 1  . Multiple Vitamins-Minerals (WOMENS MULTI PO) Take 1 tablet by mouth daily.      . rosuvastatin (CRESTOR) 5 MG tablet TAKE 1 TABLET BY MOUTH EVERY DAY 90 tablet 1  . SUMAtriptan (IMITREX) 50 MG tablet TAKE 1 TABLET EVERY 2 HOURS AS NEEDED FOR MIGRAINE--MAX 2 DOSES PER DAY 10 tablet 11   No current facility-administered medications on file prior to visit.     Review of Systems  Constitutional: Positive for weight loss. Negative for chills, fever and malaise/fatigue.  HENT: Positive for ear discharge. Negative for hearing loss and sore throat.   Eyes: Negative for blurred vision, discharge and redness.  Respiratory: Negative for cough and shortness of  breath.   Cardiovascular: Negative for chest pain, palpitations and leg swelling.  Gastrointestinal: Negative for abdominal pain, blood in stool and heartburn.  Genitourinary: Negative for dysuria, frequency and urgency.  Skin: Negative for rash.  Neurological: Negative for dizziness and headaches.  Endo/Heme/Allergies: Negative for polydipsia.  Psychiatric/Behavioral: Negative for depression. The patient is not nervous/anxious.     Observations/Objective: Patient appears well, in no distress Weight is baseline (overwt - with some wt loss since last visit)  No facial swelling or asymmetry Normal voice-not hoarse and no slurred speech No obvious tremor or mobility impairment Moving neck and UEs normally Able to hear the call well  No cough or shortness of breath during interview  Talkative and mentally sharp with no cognitive changes No skin changes on face or neck , no rash or pallor Affect is normal    Assessment and Plan: Problem List Items Addressed This Visit      Cardiovascular and Mediastinum   Essential hypertension    bp in fair control at this time  BP Readings from Last 1 Encounters:  08/28/17 116/68   No changes needed Most recent labs reviewed  Disc lifstyle change with low sodium diet and exercise  bp has also been well controlled at home        Genitourinary   UTI'S, RECURRENT    With hx of ureter reflux since childhood Her urologist retired Refilled trimethoprim -to take once daily for prophylaxis  Watching renal fxn Enc good fluid intake       Relevant Medications   trimethoprim (TRIMPEX) 100 MG tablet     Other   HYPERCHOLESTEROLEMIA    Disc goals  for lipids and reasons to control them Rev last labs with pt Rev low sat fat diet in detail Well controlled with statin /crestor and diet Trig down with better diet       Routine general medical examination at a health care facility - Primary    Reviewed health habits including diet and exercise and skin cancer prevention Reviewed appropriate screening tests for age  Also reviewed health mt list, fam hx and immunization status , as well as social and family history   See HPI Labs reviewed  Commended on wt loss doing well  Noted fall and fall prevention was discussed PHQ score of 0 Due for tetanus shot if she has an injury (medicare does not cover) Due for pna 23 vaccine-she wants to wait until her next visit  Also enc strongly to get a flu shot (in light of covid pandemic)-she will think about it  Enc ca and D for bone health  Continued exercise also enc        Prediabetes    Lab Results  Component Value Date   HGBA1C 5.9 12/01/2018   Improved with wt loss and better diet/exercise disc imp of low glycemic diet and wt loss to prevent DM2       Obesity    Commended wt loss so far with NOOM program and healthy lifestyle Discussed how this problem influences overall health and the risks it imposes  Reviewed plan for weight loss with lower calorie diet (via better food choices and also portion control or program like weight watchers) and exercise building up to or more than 30 minutes 5 days per week including some aerobic activity             Follow Up Instructions: You are due for a pneumonia vaccine (pna 23) the next time you are  here  Get a flu shot in the fall (consider)   I refilled trimethoprim   Stay active  Be careful not to fall  Yoga is good for balance    I discussed the assessment and treatment plan with the patient. The patient was provided an opportunity to ask questions and all were answered. The patient agreed with the plan and demonstrated an  understanding of the instructions.   The patient was advised to call back or seek an in-person evaluation if the symptoms worsen or if the condition fails to improve as anticipated.    Roxy MannsMarne Tower, MD

## 2018-12-09 NOTE — Assessment & Plan Note (Signed)
With hx of ureter reflux since childhood Her urologist retired Refilled trimethoprim -to take once daily for prophylaxis  Watching renal fxn Enc good fluid intake

## 2018-12-26 ENCOUNTER — Encounter: Payer: Self-pay | Admitting: Family Medicine

## 2018-12-26 ENCOUNTER — Other Ambulatory Visit: Payer: Self-pay | Admitting: *Deleted

## 2018-12-26 ENCOUNTER — Ambulatory Visit (INDEPENDENT_AMBULATORY_CARE_PROVIDER_SITE_OTHER): Payer: PPO | Admitting: Family Medicine

## 2018-12-26 DIAGNOSIS — Z20828 Contact with and (suspected) exposure to other viral communicable diseases: Secondary | ICD-10-CM | POA: Diagnosis not present

## 2018-12-26 DIAGNOSIS — J069 Acute upper respiratory infection, unspecified: Secondary | ICD-10-CM | POA: Insufficient documentation

## 2018-12-26 DIAGNOSIS — Z20822 Contact with and (suspected) exposure to covid-19: Secondary | ICD-10-CM

## 2018-12-26 MED ORDER — HYDROCOD POLST-CPM POLST ER 10-8 MG/5ML PO SUER: 5.0000 mL | Freq: Two times a day (BID) | ORAL | 0 refills | Status: DC | PRN

## 2018-12-26 NOTE — Assessment & Plan Note (Signed)
With cough/congestion and low grade temp elevation  No sob or taste/smell change or GI symptoms No known covid contacts   Disc symptomatic care- mucinex DM, acetatminophen  tussionex for cough prn with caution of sedation  Update if not starting to improve in a week or if worsening   covid swab ordered -pt will go to get tested now and then isolate herself until a result  Enc to keep distance from husb with copd and elderly mother

## 2018-12-26 NOTE — Progress Notes (Signed)
Virtual Visit via Video Note  I connected with Amy Mills on 12/26/18 at 12:00 PM EDT by a video enabled telemedicine application and verified that I am speaking with the correct person using two identifiers.  Location: Patient: home Provider: office    I discussed the limitations of evaluation and management by telemedicine and the availability of in person appointments. The patient expressed understanding and agreed to proceed.  History of Present Illness: Pt presents with fever and respiratory symptoms   Started 2 d ago with a little ST/tickle and sinus drainage  Felt badly yesterday  Today fever - 100.0 - did not take anything for it  A little achey   Cough with chest congestion for 2 days  No sob or wheezing Green sputum- just a little bit  Not sob   Hoarse voice- her throat feels thick /not severely sore   She tends to get a cough very easily  Headache- comes and goes - mild annoying  Across forehead-like a sinus headaches   No dizziness No loss of taste or smell  No nvd   Otc:  Last night she took store brand of mucinex DM-it helped    Husband has copd Takes care of 68 yo mother    No particular sick contacts  Is back to work- mask and no close contact and no one sick   Patient Active Problem List   Diagnosis Date Noted  . URI (upper respiratory infection) 12/26/2018  . Encounter for hepatitis C screening test for low risk patient 06/13/2018  . Medicare annual wellness visit, subsequent 08/28/2017  . Estrogen deficiency 08/28/2017  . Prediabetes 06/13/2015  . Obesity 06/13/2015  . Routine general medical examination at a health care facility 09/14/2011  . STRESS REACTION, ACUTE, WITH EMOTIONAL DISTURBANCE 05/12/2008  . HYPERCHOLESTEROLEMIA 04/21/2007  . Essential hypertension 04/21/2007  . UTI'S, RECURRENT 04/21/2007  . FIBROMYALGIA 04/21/2007  . RETRACTION/LAG, EYELID 09/20/2006  . PARESTHESIA 09/20/2006  . MRI, BRAIN, ABNORMAL 09/20/2006    Past Medical History:  Diagnosis Date  . Anxiety reaction   . Back pain   . Fibromyalgia   . History of recurrent UTIs   . HLD (hyperlipidemia)   . Hypertension    Past Surgical History:  Procedure Laterality Date  . ABDOMINAL HYSTERECTOMY    . OVARIAN CYST REMOVAL    . TONSILLECTOMY     Social History   Tobacco Use  . Smoking status: Never Smoker  . Smokeless tobacco: Never Used  Substance Use Topics  . Alcohol use: Yes    Alcohol/week: 0.0 standard drinks    Comment: rare  . Drug use: No   Family History  Problem Relation Age of Onset  . Thyroid nodules Mother   . Heart disease Mother   . Hypertension Father   . Dementia Father   . Alcohol abuse Brother   . Hyperlipidemia Daughter   . Hypertension Daughter   . Cancer Maternal Aunt        breast  . Breast cancer Maternal Aunt   . Cancer Paternal Aunt        breast  . Breast cancer Paternal Aunt    Allergies  Allergen Reactions  . Amoxicillin-Pot Clavulanate Diarrhea    Diarrhea   . Doxycycline Other (See Comments)    vomiting  . Nitrofurantoin     REACTION: hives   Current Outpatient Medications on File Prior to Visit  Medication Sig Dispense Refill  . APPLE CIDER VINEGAR PO Take 1 capsule by  mouth daily.    Marland Kitchen atenolol (TENORMIN) 25 MG tablet TAKE 1 TABLET BY MOUTH EVERY DAY 90 tablet 1  . Cholecalciferol (D3-1000 PO) Take 1 tablet by mouth daily.    . Diclofenac Sodium CR 100 MG 24 hr tablet TAKE 1 TABLET BY MOUTH EVERY DAY 90 tablet 1  . Multiple Vitamins-Minerals (WOMENS MULTI PO) Take 1 tablet by mouth daily.      . rosuvastatin (CRESTOR) 5 MG tablet TAKE 1 TABLET BY MOUTH EVERY DAY 90 tablet 1  . SUMAtriptan (IMITREX) 50 MG tablet TAKE 1 TABLET EVERY 2 HOURS AS NEEDED FOR MIGRAINE--MAX 2 DOSES PER DAY 10 tablet 11  . trimethoprim (TRIMPEX) 100 MG tablet Take 1 tablet (100 mg total) by mouth daily. 90 tablet 3   No current facility-administered medications on file prior to visit.    Review of  Systems  Constitutional: Positive for fever and malaise/fatigue. Negative for diaphoresis.  HENT: Positive for congestion and sinus pain. Negative for ear discharge, ear pain and sore throat.   Eyes: Negative for blurred vision, discharge and redness.  Respiratory: Positive for cough and sputum production. Negative for hemoptysis, shortness of breath, wheezing and stridor.   Cardiovascular: Negative for chest pain, palpitations and leg swelling.  Gastrointestinal: Negative for abdominal pain, diarrhea, heartburn, nausea and vomiting.  Skin: Negative for rash.  Neurological: Positive for headaches. Negative for dizziness.    Observations/Objective: Patient appears well, in no distress (somewhat fatigued) Weight is baseline  No facial swelling or asymmetry Voice is hoarse  No obvious tremor or mobility impairment Moving neck and UEs normally Able to hear the call well  Productive sounding cough noted, no sob or wheezing on camera  Talkative and mentally sharp with no cognitive changes No skin changes on face or neck , no rash or pallor Affect is normal    Assessment and Plan: Problem List Items Addressed This Visit      Respiratory   URI (upper respiratory infection)    With cough/congestion and low grade temp elevation  No sob or taste/smell change or GI symptoms No known covid contacts   Disc symptomatic care- mucinex DM, acetatminophen  tussionex for cough prn with caution of sedation  Update if not starting to improve in a week or if worsening   covid swab ordered -pt will go to get tested now and then isolate herself until a result  Enc to keep distance from husb with copd and elderly mother       Relevant Orders   Novel Coronavirus, NAA ([facility])       Follow Up Instructions: Drink fluids and rest Isolate yourself- wear a mask if possible as well  mucinex DM is helpful for cough I sent in tussionex for cough also to use with caution of sedation  Antihistamine  (zyrtec) for runny nose or drip as needed Acetaminophen for fever or aches   I ordered a covid test-go ahead to the [facility] location to get tested now and we will alert you when it returns   If symptoms suddenly worsen (or if you become wheezy/short of breath or develop new symptoms) please let us know immediately and go to the ER if necessary    I discussed the assessment and treatment plan with the patient. The patient was provided an opportunity to ask questions and all were answered. The patient agreed with the plan and demonstrated an understanding of the instructions.   The patient was advised to call back or seek an  in-person evaluation if the symptoms worsen or if the condition fails to improve as anticipated.     Loura Pardon, MD

## 2018-12-26 NOTE — Patient Instructions (Signed)
Drink fluids and rest Isolate yourself- wear a mask if possible as well  mucinex DM is helpful for cough I sent in tussionex for cough also to use with caution of sedation  Antihistamine (zyrtec) for runny nose or drip as needed Acetaminophen for fever or aches   I ordered a covid test-go ahead to the [facility] location to get tested now and we will alert you when it returns   If symptoms suddenly worsen (or if you become wheezy/short of breath or develop new symptoms) please let us know immediately and go to the ER if necessary

## 2018-12-27 LAB — NOVEL CORONAVIRUS, NAA: SARS-CoV-2, NAA: NOT DETECTED

## 2019-01-29 ENCOUNTER — Telehealth: Payer: Self-pay | Admitting: Family Medicine

## 2019-01-29 NOTE — Telephone Encounter (Signed)
Best number [phone]  [place] @ [employer] Wanted to know    On office visit dated 12/09/2018 code [phone]  is not covered for tele phone visit can a different code be used

## 2019-01-29 NOTE — Telephone Encounter (Signed)
Sorry! That should be a regular established level 3 office visit code Can you change it ? If not do I just addend it to change? Thanks

## 2019-01-30 NOTE — Addendum Note (Signed)
Addended by: Jacqualin Combes on: 01/30/2019 08:54 AM   Modules accepted: Level of Service

## 2019-01-30 NOTE — Telephone Encounter (Signed)
Vallarie Mare updated it to the regular est level 3 code Dr. Glori Bickers requested.   FYI to PCP

## 2019-02-27 ENCOUNTER — Other Ambulatory Visit: Payer: Self-pay | Admitting: Family Medicine

## 2019-02-27 DIAGNOSIS — Z1231 Encounter for screening mammogram for malignant neoplasm of breast: Secondary | ICD-10-CM

## 2019-03-30 ENCOUNTER — Other Ambulatory Visit: Payer: Self-pay

## 2019-04-08 DIAGNOSIS — Z03818 Encounter for observation for suspected exposure to other biological agents ruled out: Secondary | ICD-10-CM | POA: Diagnosis not present

## 2019-04-20 ENCOUNTER — Ambulatory Visit
Admission: RE | Admit: 2019-04-20 | Discharge: 2019-04-20 | Disposition: A | Discharge status: Home / Self Care | Payer: PPO | Source: Ambulatory Visit | Attending: Family Medicine | Admitting: Family Medicine

## 2019-04-20 ENCOUNTER — Other Ambulatory Visit: Payer: Self-pay

## 2019-04-20 DIAGNOSIS — Z1231 Encounter for screening mammogram for malignant neoplasm of breast: Secondary | ICD-10-CM

## 2019-04-21 ENCOUNTER — Other Ambulatory Visit: Payer: Self-pay | Admitting: Family Medicine

## 2019-04-21 DIAGNOSIS — R928 Other abnormal and inconclusive findings on diagnostic imaging of breast: Secondary | ICD-10-CM

## 2019-04-29 ENCOUNTER — Other Ambulatory Visit: Payer: PPO

## 2019-05-04 DIAGNOSIS — Z03818 Encounter for observation for suspected exposure to other biological agents ruled out: Secondary | ICD-10-CM | POA: Diagnosis not present

## 2019-05-15 ENCOUNTER — Other Ambulatory Visit: Payer: Self-pay

## 2019-05-15 ENCOUNTER — Ambulatory Visit
Admission: RE | Admit: 2019-05-15 | Discharge: 2019-05-15 | Disposition: A | Discharge status: Home / Self Care | Payer: PPO | Source: Ambulatory Visit | Attending: Family Medicine | Admitting: Family Medicine

## 2019-05-15 ENCOUNTER — Ambulatory Visit: Payer: PPO

## 2019-05-15 DIAGNOSIS — R928 Other abnormal and inconclusive findings on diagnostic imaging of breast: Secondary | ICD-10-CM

## 2019-05-15 DIAGNOSIS — R922 Inconclusive mammogram: Secondary | ICD-10-CM | POA: Diagnosis not present

## 2019-05-25 DIAGNOSIS — Z03818 Encounter for observation for suspected exposure to other biological agents ruled out: Secondary | ICD-10-CM | POA: Diagnosis not present

## 2019-06-05 ENCOUNTER — Other Ambulatory Visit: Payer: Self-pay | Admitting: Family Medicine

## 2019-06-05 NOTE — Telephone Encounter (Signed)
CPE was a doxy on 12/09/18, last filled on 12/01/18 #90 tabs with 1 refill, please advise

## 2019-06-23 DIAGNOSIS — H2513 Age-related nuclear cataract, bilateral: Secondary | ICD-10-CM | POA: Diagnosis not present

## 2019-10-08 ENCOUNTER — Other Ambulatory Visit: Payer: Self-pay | Admitting: Family Medicine

## 2019-12-03 ENCOUNTER — Telehealth: Payer: Self-pay | Admitting: Family Medicine

## 2019-12-03 DIAGNOSIS — Z0000 Encounter for general adult medical examination without abnormal findings: Secondary | ICD-10-CM

## 2019-12-03 DIAGNOSIS — I10 Essential (primary) hypertension: Secondary | ICD-10-CM

## 2019-12-03 DIAGNOSIS — E7800 Pure hypercholesterolemia, unspecified: Secondary | ICD-10-CM

## 2019-12-03 DIAGNOSIS — R7303 Prediabetes: Secondary | ICD-10-CM

## 2019-12-03 NOTE — Telephone Encounter (Signed)
-----   Message from Aquilla Solian, RT sent at 11/20/2019  1:15 PM EDT ----- Regarding: Lab Orders for Friday 7.30.2021 Please place lab orders for Friday 7.30.2021, office visit for physical on Friday 8.6.2021 Thank you, Jones Bales RT(R)

## 2019-12-04 ENCOUNTER — Other Ambulatory Visit: Payer: Self-pay

## 2019-12-04 ENCOUNTER — Other Ambulatory Visit (INDEPENDENT_AMBULATORY_CARE_PROVIDER_SITE_OTHER): Payer: PPO

## 2019-12-04 DIAGNOSIS — R7303 Prediabetes: Secondary | ICD-10-CM

## 2019-12-04 DIAGNOSIS — I10 Essential (primary) hypertension: Secondary | ICD-10-CM | POA: Diagnosis not present

## 2019-12-04 DIAGNOSIS — E7800 Pure hypercholesterolemia, unspecified: Secondary | ICD-10-CM

## 2019-12-04 LAB — LIPID PANEL
Cholesterol: 156 mg/dL (ref 0–200)
HDL: 43.5 mg/dL (ref >39.00)
LDL Cholesterol: 82 mg/dL (ref 0–99)
NonHDL: 112.14
Total CHOL/HDL Ratio: 4
Triglycerides: 149 mg/dL (ref 0.0–149.0)
VLDL: 29.8 mg/dL (ref 0.0–40.0)

## 2019-12-04 LAB — COMPREHENSIVE METABOLIC PANEL
ALT: 23 U/L (ref 0–35)
AST: 23 U/L (ref 0–37)
Albumin: 4.5 g/dL (ref 3.5–5.2)
Alkaline Phosphatase: 73 U/L (ref 39–117)
BUN: 21 mg/dL (ref 6–23)
CO2: 24 mEq/L (ref 19–32)
Calcium: 9.6 mg/dL (ref 8.4–10.5)
Chloride: 105 mEq/L (ref 96–112)
Creatinine, Ser: 0.87 mg/dL (ref 0.40–1.20)
GFR: 64.59 mL/min (ref >60.00)
Glucose, Bld: 93 mg/dL (ref 70–99)
Potassium: 5 mEq/L (ref 3.5–5.1)
Sodium: 141 mEq/L (ref 135–145)
Total Bilirubin: 0.6 mg/dL (ref 0.2–1.2)
Total Protein: 6.5 g/dL (ref 6.0–8.3)

## 2019-12-04 LAB — TSH: TSH: 2.6 u[IU]/mL (ref 0.35–4.50)

## 2019-12-04 LAB — CBC WITH DIFFERENTIAL/PLATELET
Basophils Absolute: 0.1 10*3/uL (ref 0.0–0.1)
Basophils Relative: 0.8 % (ref 0.0–3.0)
Eosinophils Absolute: 0.4 10*3/uL (ref 0.0–0.7)
Eosinophils Relative: 5.6 % — ABNORMAL HIGH (ref 0.0–5.0)
HCT: 42 % (ref 36.0–46.0)
Hemoglobin: 13.8 g/dL (ref 12.0–15.0)
Lymphocytes Relative: 30.2 % (ref 12.0–46.0)
Lymphs Abs: 2.3 10*3/uL (ref 0.7–4.0)
MCHC: 32.8 g/dL (ref 30.0–36.0)
MCV: 93 fl (ref 78.0–100.0)
Monocytes Absolute: 0.7 10*3/uL (ref 0.1–1.0)
Monocytes Relative: 9.2 % (ref 3.0–12.0)
Neutro Abs: 4.1 10*3/uL (ref 1.4–7.7)
Neutrophils Relative %: 54.2 % (ref 43.0–77.0)
Platelets: 248 10*3/uL (ref 150.0–400.0)
RBC: 4.52 Mil/uL (ref 3.87–5.11)
RDW: 13 % (ref 11.5–15.5)
WBC: 7.6 10*3/uL (ref 4.0–10.5)

## 2019-12-04 LAB — HEMOGLOBIN A1C: Hgb A1c MFr Bld: 6 % (ref 4.6–6.5)

## 2019-12-09 ENCOUNTER — Other Ambulatory Visit: Payer: Self-pay | Admitting: Family Medicine

## 2019-12-09 NOTE — Telephone Encounter (Signed)
AWV scheduled on 12/11/19, last filled on 06/05/19 #90 tabs with 1 refill

## 2019-12-11 ENCOUNTER — Ambulatory Visit (INDEPENDENT_AMBULATORY_CARE_PROVIDER_SITE_OTHER): Payer: PPO | Admitting: Family Medicine

## 2019-12-11 ENCOUNTER — Other Ambulatory Visit: Payer: Self-pay

## 2019-12-11 ENCOUNTER — Encounter: Payer: Self-pay | Admitting: Family Medicine

## 2019-12-11 VITALS — BP 122/70 | HR 74 | Temp 97.5°F | Ht 62.75 in | Wt 175.4 lb

## 2019-12-11 DIAGNOSIS — I10 Essential (primary) hypertension: Secondary | ICD-10-CM | POA: Diagnosis not present

## 2019-12-11 DIAGNOSIS — R7303 Prediabetes: Secondary | ICD-10-CM | POA: Diagnosis not present

## 2019-12-11 DIAGNOSIS — E6609 Other obesity due to excess calories: Secondary | ICD-10-CM

## 2019-12-11 DIAGNOSIS — E7800 Pure hypercholesterolemia, unspecified: Secondary | ICD-10-CM

## 2019-12-11 DIAGNOSIS — E66811 Obesity, class 1: Secondary | ICD-10-CM

## 2019-12-11 DIAGNOSIS — Z6831 Body mass index (BMI) 31.0-31.9, adult: Secondary | ICD-10-CM | POA: Diagnosis not present

## 2019-12-11 DIAGNOSIS — Z0000 Encounter for general adult medical examination without abnormal findings: Secondary | ICD-10-CM | POA: Diagnosis not present

## 2019-12-11 MED ORDER — TRIMETHOPRIM 100 MG PO TABS: 100.0000 mg | ORAL_TABLET | Freq: Every day | ORAL | 3 refills | Status: DC

## 2019-12-11 NOTE — Progress Notes (Signed)
Subjective:    Patient ID: Amy Mills, female    DOB: 05/04/1951, 69 y.o.   MRN: [id number]  This visit occurred during the SARS-CoV-2 public health emergency.  Safety protocols were in place, including screening questions prior to the visit, additional usage of staff PPE, and extensive cleaning of exam room while observing appropriate contact time as indicated for disinfecting solutions.    HPI Pt presents for amw and health mt exam with rev of chronic medical problems   I have personally reviewed the Medicare Annual Wellness questionnaire and have noted 1. The patient's medical and social history 2. Their use of alcohol, tobacco or illicit drugs 3. Their current medications and supplements 4. The patient's functional ability including ADL's, fall risks, home safety risks and hearing or visual             impairment. 5. Diet and physical activities 6. Evidence for depression or mood disorders  The patients weight, height, BMI have been recorded in the chart and visual acuity is per eye clinic.  I have made referrals, counseling and provided education to the patient based review of the above and I have provided the pt with a written personalized care plan for preventive services. Reviewed and updated provider list, see scanned forms.  See scanned forms.  Routine anticipatory guidance given to patient.  See health maintenance. Colon cancer screening 1/12 colonoscopy Breast cancer screening  Mammogram 12/20 with call back-was ok  Self breast exam-no lumps or changes  Flu vaccine- does not get  covid status = has been immunized /pfizer Tetanus vaccine Td 1/07- def for insurance Pneumovax -due for pna 23 Zoster vaccine-is interested in shingrix if covered  Dexa 6/19 (BMD in the normal range) Falls-none Fractures-none  Supplements-taking vit D Exercise - walking a lot /also stairs   Advance directive-does not have it  Cognitive function addressed- see scanned forms- and if  abnormal then additional documentation follows.  No concerns  Still working /doing finances  No confusion  Does not get lost   PMH and SH reviewed  Meds, vitals, and allergies reviewed.   ROS: See HPI.  Otherwise negative.    Care team: Ahlana Slaydon- PCP [facility]  Weight : Wt Readings from Last 3 Encounters:  12/11/19 175 lb 6 oz (79.5 kg)  12/09/18 172 lb (78 kg)  08/28/17 189 lb 8 oz (86 kg)  has done better with diet - a lot cleaner and watching calories  31.31 kg/m   Doing well  Has changed jobs / working 2nd shift at [employer] -really likes it   Hearing/vision:  Hearing Screening   125Hz  250Hz  500Hz  1000Hz  2000Hz  3000Hz  4000Hz  6000Hz  8000Hz   Right ear:   20 20 20  20     Left ear:   20  20  20     Vision Screening Comments: Saw eye doctor in February 2021  She has noticed some change in her hearing -has to pay more attention     HTN bp is stable today  No cp or palpitations or headaches or edema  No side effects to medicines  BP Readings from Last 3 Encounters:  12/11/19 122/70  08/28/17 116/68  06/24/17 128/70     Pulse Readings from Last 3 Encounters:  12/11/19 74  08/28/17 76  06/24/17 70      Lab Results  Component Value Date   CREATININE 0.87 12/04/2019   BUN 21 12/04/2019   NA 141 12/04/2019   K 5.0 12/04/2019   CL  105 12/04/2019   CO2 24 12/04/2019   Lab Results  Component Value Date   ALT 23 12/04/2019   AST 23 12/04/2019   ALKPHOS 73 12/04/2019   BILITOT 0.6 12/04/2019    Prediabetes Lab Results  Component Value Date   HGBA1C 6.0 12/04/2019   Up from 5.9   Cholesterol Lab Results  Component Value Date   CHOL 156 12/04/2019   CHOL 167 12/01/2018   CHOL 175 08/21/2017   Lab Results  Component Value Date   HDL 43.50 12/04/2019   HDL 45.10 12/01/2018   HDL 47.30 08/21/2017   Lab Results  Component Value Date   LDLCALC 82 12/04/2019   LDLCALC 98 12/01/2018   LDLCALC 95 08/21/2017   Lab Results    Component Value Date   TRIG 149.0 12/04/2019   TRIG 121.0 12/01/2018   TRIG 163.0 (H) 08/21/2017   Lab Results  Component Value Date   CHOLHDL 4 12/04/2019   CHOLHDL 4 12/01/2018   CHOLHDL 4 08/21/2017   Lab Results  Component Value Date   LDLDIRECT 212.8 07/02/2006   crestor and diet   Lab Results  Component Value Date   WBC 7.6 12/04/2019   HGB 13.8 12/04/2019   HCT 42.0 12/04/2019   MCV 93.0 12/04/2019   PLT 248.0 12/04/2019   Lab Results  Component Value Date   TSH 2.60 12/04/2019     Patient Active Problem List   Diagnosis Date Noted  . Encounter for hepatitis C screening test for low risk patient 06/13/2018  . Medicare annual wellness visit, subsequent 08/28/2017  . Estrogen deficiency 08/28/2017  . Prediabetes 06/13/2015  . Obesity 06/13/2015  . Routine general medical examination at a health care facility 09/14/2011  . STRESS REACTION, ACUTE, WITH EMOTIONAL DISTURBANCE 05/12/2008  . HYPERCHOLESTEROLEMIA 04/21/2007  . Essential hypertension 04/21/2007  . UTI'S, RECURRENT 04/21/2007  . FIBROMYALGIA 04/21/2007  . RETRACTION/LAG, EYELID 09/20/2006  . PARESTHESIA 09/20/2006  . MRI, BRAIN, ABNORMAL 09/20/2006   Past Medical History:  Diagnosis Date  . Anxiety reaction   . Back pain   . Fibromyalgia   . History of recurrent UTIs   . HLD (hyperlipidemia)   . Hypertension    Past Surgical History:  Procedure Laterality Date  . ABDOMINAL HYSTERECTOMY    . OVARIAN CYST REMOVAL    . TONSILLECTOMY     Social History   Tobacco Use  . Smoking status: Never Smoker  . Smokeless tobacco: Never Used  Substance Use Topics  . Alcohol use: Yes    Alcohol/week: 0.0 standard drinks    Comment: rare  . Drug use: No   Family History  Problem Relation Age of Onset  . Thyroid nodules Mother   . Heart disease Mother   . Hypertension Father   . Dementia Father   . Alcohol abuse Brother   . Hyperlipidemia Daughter   . Hypertension Daughter   . Cancer  Maternal Aunt        breast  . Breast cancer Maternal Aunt   . Cancer Paternal Aunt        breast  . Breast cancer Paternal Aunt    Allergies  Allergen Reactions  . Amoxicillin-Pot Clavulanate Diarrhea    Diarrhea   . Doxycycline Other (See Comments)    vomiting  . Nitrofurantoin     REACTION: hives   Current Outpatient Medications on File Prior to Visit  Medication Sig Dispense Refill  . APPLE CIDER VINEGAR PO Take 1 capsule by mouth  daily.    . atenolol (TENORMIN) 25 MG tablet TAKE 1 TABLET BY MOUTH EVERY DAY 90 tablet 0  . Cholecalciferol (D3-1000 PO) Take 1 tablet by mouth daily.    . Diclofenac Sodium CR 100 MG 24 hr tablet TAKE 1 TABLET BY MOUTH EVERY DAY 90 tablet 1  . Multiple Vitamins-Minerals (WOMENS MULTI PO) Take 1 tablet by mouth daily.      . rosuvastatin (CRESTOR) 5 MG tablet TAKE 1 TABLET BY MOUTH EVERY DAY 90 tablet 2  . SUMAtriptan (IMITREX) 50 MG tablet TAKE 1 TABLET EVERY 2 HOURS AS NEEDED FOR MIGRAINE--MAX 2 DOSES PER DAY 10 tablet 11  . chlorpheniramine-HYDROcodone (TUSSIONEX PENNKINETIC ER) 10-8 MG/5ML SUER Take 5 mLs by mouth every 12 (twelve) hours as needed for cough. Caution of sedation (Patient not taking: Reported on 12/11/2019) 120 mL 0   No current facility-administered medications on file prior to visit.    Review of Systems  Constitutional: Negative for activity change, appetite change, fatigue, fever and unexpected weight change.  HENT: Negative for congestion, ear pain, rhinorrhea, sinus pressure and sore throat.   Eyes: Negative for pain, redness and visual disturbance.  Respiratory: Negative for cough, shortness of breath and wheezing.   Cardiovascular: Negative for chest pain and palpitations.  Gastrointestinal: Negative for abdominal pain, blood in stool, constipation and diarrhea.  Endocrine: Negative for polydipsia and polyuria.  Genitourinary: Negative for dysuria, frequency and urgency.  Musculoskeletal: Negative for arthralgias, back  pain and myalgias.  Skin: Negative for pallor and rash.  Allergic/Immunologic: Negative for environmental allergies.  Neurological: Negative for dizziness, syncope and headaches.  Hematological: Negative for adenopathy. Does not bruise/bleed easily.  Psychiatric/Behavioral: Negative for decreased concentration and dysphoric mood. The patient is not nervous/anxious.        Objective:   Physical Exam Constitutional:      General: She is not in acute distress.    Appearance: Normal appearance. She is well-developed. She is obese. She is not ill-appearing or diaphoretic.  HENT:     Head: Normocephalic and atraumatic.     Right Ear: Tympanic membrane, ear canal and external ear normal.     Left Ear: Tympanic membrane, ear canal and external ear normal.     Nose: Nose normal. No congestion.     Mouth/Throat:     Mouth: Mucous membranes are moist.     Pharynx: Oropharynx is clear. No posterior oropharyngeal erythema.  Eyes:     General: No scleral icterus.    Extraocular Movements: Extraocular movements intact.     Conjunctiva/sclera: Conjunctivae normal.     Pupils: Pupils are equal, round, and reactive to light.  Neck:     Thyroid: No thyromegaly.     Vascular: No carotid bruit or JVD.  Cardiovascular:     Rate and Rhythm: Normal rate and regular rhythm.     Pulses: Normal pulses.     Heart sounds: Normal heart sounds. No gallop.   Pulmonary:     Effort: Pulmonary effort is normal. No respiratory distress.     Breath sounds: Normal breath sounds. No wheezing.     Comments: Good air exch Chest:     Chest wall: No tenderness.  Abdominal:     General: Bowel sounds are normal. There is no distension or abdominal bruit.     Palpations: Abdomen is soft. There is no mass.     Tenderness: There is no abdominal tenderness.     Hernia: No hernia is present.  Genitourinary:  Comments: Breast exam: No mass, nodules, thickening, tenderness, bulging, retraction, inflamation, nipple  discharge or skin changes noted.  No axillary or clavicular LA.     Musculoskeletal:        General: No tenderness. Normal range of motion.     Cervical back: Normal range of motion and neck supple. No rigidity. No muscular tenderness.     Right lower leg: No edema.     Left lower leg: No edema.     Comments: No kyphosis   Lymphadenopathy:     Cervical: No cervical adenopathy.  Skin:    General: Skin is warm and dry.     Coloration: Skin is not pale.     Findings: No erythema or rash.  Neurological:     Mental Status: She is alert. Mental status is at baseline.     Cranial Nerves: No cranial nerve deficit.     Motor: No abnormal muscle tone.     Coordination: Coordination normal.     Gait: Gait normal.     Deep Tendon Reflexes: Reflexes are normal and symmetric. Reflexes normal.  Psychiatric:        Mood and Affect: Mood normal.        Cognition and Memory: Cognition and memory normal.           Assessment & Plan:   Problem List Items Addressed This Visit      Cardiovascular and Mediastinum   Essential hypertension    bp in fair control at this time  BP Readings from Last 1 Encounters:  12/11/19 122/70   No changes needed Most recent labs reviewed  Disc lifstyle change with low sodium diet and exercise          Other   HYPERCHOLESTEROLEMIA    Disc goals for lipids and reasons to control them Rev last labs with pt Rev low sat fat diet in detail Fair control with crestor and good diet       Routine general medical examination at a health care facility    Reviewed health habits including diet and exercise and skin cancer prevention Reviewed appropriate screening tests for age  Also reviewed health mt list, fam hx and immunization status , as well as social and family history   See HPI Does not get flu vaccine  Considering pna 23 Is covid immunized  Def tetanus shot for insurance dexa was nl 6/19  No falls or fx and taking vit D Given materials to work on  [employer] directive No cognitive concerns Few missed tones on L hearing screen-she does not notice problems  utd eye/vision exam        Prediabetes    Lab Results  Component Value Date   HGBA1C 6.0 12/04/2019   disc imp of low glycemic diet and wt loss to prevent DM2       Obesity    With mild prediabetes Discussed how this problem influences overall health and the risks it imposes  Reviewed plan for weight loss with lower calorie diet (via better food choices and also portion control or program like weight watchers) and exercise building up to or more than 30 minutes 5 days per week including some aerobic activity         Medicare annual wellness visit, subsequent - Primary    Reviewed health habits including diet and exercise and skin cancer prevention Reviewed appropriate screening tests for age  Also reviewed health mt list, fam hx and immunization status , as well as  social and family history   See HPI Does not get flu vaccine  Considering pna 23 Is covid immunized  Def tetanus shot for insurance dexa was nl 6/19  No falls or fx and taking vit D Given materials to work on [employer] directive No cognitive concerns Few missed tones on L hearing screen-she does not notice problems  utd eye/vision exam

## 2019-12-11 NOTE — Patient Instructions (Addendum)
If you are interested in the new shingles vaccine (Shingrix) - call your local pharmacy to check on coverage and availability  If affordable, get on a wait list at your pharmacy to get the vaccine.  Please work on an advance directive - the blue booklet   For diabetes prevention  Try to get most of your carbohydrates from produce (with the exception of white potatoes)  Eat less bread/pasta/rice/snack foods/cereals/sweets and other items from the middle of the grocery store (processed carbs)  Keep up the good work with healthy habits and self care

## 2019-12-13 NOTE — Assessment & Plan Note (Signed)
Disc goals for lipids and reasons to control them Rev last labs with pt Rev low sat fat diet in detail Fair control with crestor and good diet

## 2019-12-13 NOTE — Assessment & Plan Note (Signed)
With mild prediabetes Discussed how this problem influences overall health and the risks it imposes  Reviewed plan for weight loss with lower calorie diet (via better food choices and also portion control or program like weight watchers) and exercise building up to or more than 30 minutes 5 days per week including some aerobic activity

## 2019-12-13 NOTE — Assessment & Plan Note (Signed)
Reviewed health habits including diet and exercise and skin cancer prevention Reviewed appropriate screening tests for age  Also reviewed health mt list, fam hx and immunization status , as well as social and family history   See HPI Does not get flu vaccine  Considering pna 23 Is covid immunized  Def tetanus shot for insurance dexa was nl 6/19  No falls or fx and taking vit D Given materials to work on [employer] directive No cognitive concerns Few missed tones on L hearing screen-she does not notice problems  utd eye/vision exam

## 2019-12-13 NOTE — Assessment & Plan Note (Signed)
Lab Results  Component Value Date   HGBA1C 6.0 12/04/2019   disc imp of low glycemic diet and wt loss to prevent DM2

## 2019-12-13 NOTE — Assessment & Plan Note (Signed)
bp in fair control at this time  BP Readings from Last 1 Encounters:  12/11/19 122/70   No changes needed Most recent labs reviewed  Disc lifstyle change with low sodium diet and exercise

## 2019-12-13 NOTE — Assessment & Plan Note (Signed)
Reviewed health habits including diet and exercise and skin cancer prevention Reviewed appropriate screening tests for age  Also reviewed health mt list, fam hx and immunization status , as well as social and family history   See HPI Does not get flu vaccine  Considering pna 23 Is covid immunized  Def tetanus shot for insurance dexa was nl 6/19  No falls or fx and taking vit D Given materials to work on adv directive No cognitive concerns Few missed tones on L hearing screen-she does not notice problems  utd eye/vision exam   

## 2019-12-24 ENCOUNTER — Telehealth: Payer: Self-pay | Admitting: Family Medicine

## 2019-12-24 NOTE — Telephone Encounter (Signed)
Pt changed pharmacy and needs rx for Atenolol

## 2019-12-24 NOTE — Telephone Encounter (Signed)
Lvm asking pt to call back.  Need to confirm she how uses [facility].

## 2019-12-25 MED ORDER — ATENOLOL 25 MG PO TABS: 25.0000 mg | ORAL_TABLET | Freq: Every day | ORAL | 2 refills | Status: DC

## 2019-12-25 NOTE — Telephone Encounter (Signed)
Pt confirmed she is using [facility]

## 2019-12-25 NOTE — Addendum Note (Signed)
Addended by: Shon Millet on: 12/25/2019 10:00 AM   Modules accepted: Orders

## 2019-12-25 NOTE — Telephone Encounter (Signed)
Rx sent 

## 2020-01-08 ENCOUNTER — Other Ambulatory Visit: Payer: Self-pay | Admitting: Family Medicine

## 2020-03-23 ENCOUNTER — Other Ambulatory Visit: Payer: Self-pay | Admitting: Family Medicine

## 2020-04-04 ENCOUNTER — Other Ambulatory Visit: Payer: Self-pay | Admitting: Family Medicine

## 2020-04-04 DIAGNOSIS — Z1231 Encounter for screening mammogram for malignant neoplasm of breast: Secondary | ICD-10-CM

## 2020-05-17 ENCOUNTER — Ambulatory Visit: Payer: PPO

## 2020-06-23 ENCOUNTER — Other Ambulatory Visit: Payer: Self-pay | Admitting: Family Medicine

## 2020-06-24 NOTE — Telephone Encounter (Signed)
CPE was on 12/11/19, last filled on 12/09/19 #90 tabs with 1 refill

## 2020-06-28 ENCOUNTER — Other Ambulatory Visit: Payer: Self-pay | Admitting: Family Medicine

## 2020-06-28 DIAGNOSIS — Z1231 Encounter for screening mammogram for malignant neoplasm of breast: Secondary | ICD-10-CM

## 2020-08-03 ENCOUNTER — Ambulatory Visit
Admission: RE | Admit: 2020-08-03 | Discharge: 2020-08-03 | Disposition: A | Discharge status: Home / Self Care | Payer: PPO | Source: Ambulatory Visit

## 2020-08-03 ENCOUNTER — Other Ambulatory Visit: Payer: Self-pay

## 2020-08-03 DIAGNOSIS — Z1231 Encounter for screening mammogram for malignant neoplasm of breast: Secondary | ICD-10-CM | POA: Diagnosis not present

## 2020-08-30 ENCOUNTER — Other Ambulatory Visit: Payer: PPO

## 2020-09-06 ENCOUNTER — Encounter: Payer: PPO | Admitting: Family Medicine

## 2020-09-21 ENCOUNTER — Other Ambulatory Visit: Payer: Self-pay | Admitting: Family Medicine

## 2020-09-21 NOTE — Telephone Encounter (Signed)
Pharmacy requests refill on: Atenolol 25 mg   LAST REFILL: 12/25/2019 (Q-90, R-2) LAST OV: 12/11/2019 NEXT OV: 12/12/2020 PHARMACY: [facility]

## 2020-11-27 ENCOUNTER — Encounter: Payer: Self-pay | Admitting: Gastroenterology

## 2020-12-04 ENCOUNTER — Telehealth: Payer: Self-pay | Admitting: Family Medicine

## 2020-12-04 DIAGNOSIS — E7800 Pure hypercholesterolemia, unspecified: Secondary | ICD-10-CM

## 2020-12-04 DIAGNOSIS — I10 Essential (primary) hypertension: Secondary | ICD-10-CM

## 2020-12-04 DIAGNOSIS — R7303 Prediabetes: Secondary | ICD-10-CM

## 2020-12-04 NOTE — Telephone Encounter (Signed)
-----   Message from Alvina Chou sent at 11/21/2020  9:36 AM EDT ----- Regarding: Lab orders for Monday, 8.1.22 Patient is scheduled for CPX labs, please order future labs, Thanks , Camelia Eng

## 2020-12-05 ENCOUNTER — Other Ambulatory Visit: Payer: Self-pay

## 2020-12-05 ENCOUNTER — Other Ambulatory Visit (INDEPENDENT_AMBULATORY_CARE_PROVIDER_SITE_OTHER): Payer: PPO

## 2020-12-05 DIAGNOSIS — E7800 Pure hypercholesterolemia, unspecified: Secondary | ICD-10-CM

## 2020-12-05 DIAGNOSIS — I10 Essential (primary) hypertension: Secondary | ICD-10-CM | POA: Diagnosis not present

## 2020-12-05 DIAGNOSIS — R7303 Prediabetes: Secondary | ICD-10-CM | POA: Diagnosis not present

## 2020-12-05 LAB — COMPREHENSIVE METABOLIC PANEL
ALT: 24 U/L (ref 0–35)
AST: 23 U/L (ref 0–37)
Albumin: 4.5 g/dL (ref 3.5–5.2)
Alkaline Phosphatase: 73 U/L (ref 39–117)
BUN: 19 mg/dL (ref 6–23)
CO2: 27 mEq/L (ref 19–32)
Calcium: 9.6 mg/dL (ref 8.4–10.5)
Chloride: 104 mEq/L (ref 96–112)
Creatinine, Ser: 0.8 mg/dL (ref 0.40–1.20)
GFR: 74.96 mL/min (ref >60.00)
Glucose, Bld: 93 mg/dL (ref 70–99)
Potassium: 4.5 mEq/L (ref 3.5–5.1)
Sodium: 140 mEq/L (ref 135–145)
Total Bilirubin: 0.5 mg/dL (ref 0.2–1.2)
Total Protein: 6.7 g/dL (ref 6.0–8.3)

## 2020-12-05 LAB — CBC WITH DIFFERENTIAL/PLATELET
Basophils Absolute: 0.1 10*3/uL (ref 0.0–0.1)
Basophils Relative: 0.9 % (ref 0.0–3.0)
Eosinophils Absolute: 0.3 10*3/uL (ref 0.0–0.7)
Eosinophils Relative: 5.6 % — ABNORMAL HIGH (ref 0.0–5.0)
HCT: 42.5 % (ref 36.0–46.0)
Hemoglobin: 14.1 g/dL (ref 12.0–15.0)
Lymphocytes Relative: 34 % (ref 12.0–46.0)
Lymphs Abs: 2.1 10*3/uL (ref 0.7–4.0)
MCHC: 33.2 g/dL (ref 30.0–36.0)
MCV: 92.6 fl (ref 78.0–100.0)
Monocytes Absolute: 0.5 10*3/uL (ref 0.1–1.0)
Monocytes Relative: 8.1 % (ref 3.0–12.0)
Neutro Abs: 3.1 10*3/uL (ref 1.4–7.7)
Neutrophils Relative %: 51.4 % (ref 43.0–77.0)
Platelets: 218 10*3/uL (ref 150.0–400.0)
RBC: 4.58 Mil/uL (ref 3.87–5.11)
RDW: 12.7 % (ref 11.5–15.5)
WBC: 6.1 10*3/uL (ref 4.0–10.5)

## 2020-12-05 LAB — LIPID PANEL
Cholesterol: 182 mg/dL (ref 0–200)
HDL: 48.8 mg/dL (ref >39.00)
LDL Cholesterol: 98 mg/dL (ref 0–99)
NonHDL: 133.13
Total CHOL/HDL Ratio: 4
Triglycerides: 177 mg/dL — ABNORMAL HIGH (ref 0.0–149.0)
VLDL: 35.4 mg/dL (ref 0.0–40.0)

## 2020-12-05 LAB — HEMOGLOBIN A1C: Hgb A1c MFr Bld: 6 % (ref 4.6–6.5)

## 2020-12-05 LAB — TSH: TSH: 2.85 u[IU]/mL (ref 0.35–5.50)

## 2020-12-12 ENCOUNTER — Other Ambulatory Visit: Payer: Self-pay

## 2020-12-12 ENCOUNTER — Encounter: Payer: Self-pay | Admitting: Family Medicine

## 2020-12-12 ENCOUNTER — Ambulatory Visit (INDEPENDENT_AMBULATORY_CARE_PROVIDER_SITE_OTHER): Payer: PPO | Admitting: Family Medicine

## 2020-12-12 VITALS — BP 136/80 | HR 63 | Temp 98.0°F | Ht 62.75 in | Wt 175.6 lb

## 2020-12-12 DIAGNOSIS — E6609 Other obesity due to excess calories: Secondary | ICD-10-CM

## 2020-12-12 DIAGNOSIS — Z1211 Encounter for screening for malignant neoplasm of colon: Secondary | ICD-10-CM | POA: Diagnosis not present

## 2020-12-12 DIAGNOSIS — E7800 Pure hypercholesterolemia, unspecified: Secondary | ICD-10-CM

## 2020-12-12 DIAGNOSIS — R7303 Prediabetes: Secondary | ICD-10-CM | POA: Diagnosis not present

## 2020-12-12 DIAGNOSIS — Z0000 Encounter for general adult medical examination without abnormal findings: Secondary | ICD-10-CM

## 2020-12-12 DIAGNOSIS — I10 Essential (primary) hypertension: Secondary | ICD-10-CM | POA: Diagnosis not present

## 2020-12-12 DIAGNOSIS — Z6831 Body mass index (BMI) 31.0-31.9, adult: Secondary | ICD-10-CM | POA: Diagnosis not present

## 2020-12-12 MED ORDER — ATENOLOL 25 MG PO TABS: 25.0000 mg | ORAL_TABLET | Freq: Every day | ORAL | 3 refills | Status: DC

## 2020-12-12 MED ORDER — SUMATRIPTAN SUCCINATE 50 MG PO TABS: ORAL_TABLET | ORAL | 11 refills | Status: DC

## 2020-12-12 MED ORDER — ROSUVASTATIN CALCIUM 5 MG PO TABS: ORAL_TABLET | ORAL | 3 refills | Status: DC

## 2020-12-12 NOTE — Assessment & Plan Note (Signed)
Lab Results  Component Value Date   HGBA1C 6.0 12/05/2020   disc imp of low glycemic diet and wt loss to prevent DM2

## 2020-12-12 NOTE — Assessment & Plan Note (Signed)
Discussed how this problem influences overall health and the risks it imposes  Reviewed plan for weight loss with lower calorie diet (via better food choices and also portion control or program like weight watchers) and exercise building up to or more than 30 minutes 5 days per week including some aerobic activity    

## 2020-12-12 NOTE — Assessment & Plan Note (Signed)
Reviewed health habits including diet and exercise and skin cancer prevention Reviewed appropriate screening tests for age  Also reviewed health mt list, fam hx and immunization status , as well as social and family history   See HPI Labs reviewed  Colonoscopy ordered  Mammogram utd  Declines flu vaccine or covid booster Declines shingrix  dexa utd -normal, no falls or fx Adv directive-given materials to work on  No cognitive concerns at all  Nl hearing and vision screens (with monovision contacts)

## 2020-12-12 NOTE — Patient Instructions (Addendum)
Chair yoga is a great idea  There are good videos   For cholesterol  Avoid red meat/ fried foods/ egg yolks/ fatty breakfast meats/ butter, cheese and high fat dairy/ and shellfish   For diabetes  Try to get most of your carbohydrates from produce (with the exception of white potatoes)  Eat less bread/pasta/rice/snack foods/cereals/sweets and other items from the middle of the grocery store (processed carbs)   I sent a referral to GI for colonoscopy  You will get a call

## 2020-12-12 NOTE — Assessment & Plan Note (Signed)
Disc goals for lipids and reasons to control them Rev last labs with pt Rev low sat fat diet in detail LDL and trig up a bit  Diet not as good but will work on it Plan to continue crestor 5 mg daily

## 2020-12-12 NOTE — Assessment & Plan Note (Signed)
Reviewed health habits including diet and exercise and skin cancer prevention Reviewed appropriate screening tests for age  Also reviewed health mt list, fam hx and immunization status , as well as social and family history   See HPI Labs reviewed  Colonoscopy ordered  Mammogram utd  Declines flu vaccine or covid booster Declines shingrix  dexa utd -normal, no falls or fx Adv directive-given materials to work on  No cognitive concerns at all  Nl hearing and vision screens (with monovision contacts) 

## 2020-12-12 NOTE — Progress Notes (Signed)
Subjective:    Patient ID: Amy Mills, female    DOB: March 16, 1951, 70 y.o.   MRN: [id number]  This visit occurred during the SARS-CoV-2 public health emergency.  Safety protocols were in place, including screening questions prior to the visit, additional usage of staff PPE, and extensive cleaning of exam room while observing appropriate contact time as indicated for disinfecting solutions.   HPI Pt presents for amw and health mt visit with rev of chronic health problems   I have personally reviewed the Medicare Annual Wellness questionnaire and have noted 1. The patient's medical and social history 2. Their use of alcohol, tobacco or illicit drugs 3. Their current medications and supplements 4. The patient's functional ability including ADL's, fall risks, home safety risks and hearing or visual             impairment. 5. Diet and physical activities 6. Evidence for depression or mood disorders  The patients weight, height, BMI have been recorded in the chart and visual acuity is per eye clinic.  I have made referrals, counseling and provided education to the patient based review of the above and I have provided the pt with a written personalized care plan for preventive services. Reviewed and updated provider list, see scanned forms.  See scanned forms.  Routine anticipatory guidance given to patient.  See health maintenance. Colon cancer screening  colonoscopy 1/12  (due)  Breast cancer screening- mammogram 3/22 Self breast exam-no lumps  Flu vaccine-declines  Covid immunized - has not had a booster/not planning to  Tetanus vaccine -postponed for insurance  Pneumovax had prevnar 4/19 , declines pna 23 for now  Zoster vaccine- declines vaccine  Dexa 6/19 -normal  Falls- none Fractures-none  Supplements- vitamin D  Exercise -walking   Advance directive-has packet to work on at home Cognitive function addressed- see scanned forms- and if abnormal then additional documentation  follows.   No concerns at all  Handles own affairs and mothers Some socialization   PMH and SH reviewed  Meds, vitals, and allergies reviewed.   ROS: See HPI.  Otherwise negative.    Weight : Wt Readings from Last 3 Encounters:  12/12/20 175 lb 9 oz (79.6 kg)  12/11/19 175 lb 6 oz (79.5 kg)  12/09/18 172 lb (78 kg)   31.35 kg/m   Hearing/vision: Hearing Screening   500Hz  1000Hz  2000Hz  4000Hz   Right ear 40 40 40 40  Left ear 40 40 40 40   Vision Screening   Right eye Left eye Both eyes  Without correction     With correction 20/20 20/100 20/20  Wears contacts -monovision  Developing some cataracts    Care team: Ceniya Fowers -pcp - GI   HTN bp is stable today  No cp or palpitations or headaches or edema  No side effects to medicines  BP Readings from Last 3 Encounters:  12/12/20 136/80  12/11/19 122/70  08/28/17 116/68     Pulse Readings from Last 3 Encounters:  12/12/20 63  12/11/19 74  08/28/17 76    Takes tenormin 25 mg daily   Lab Results  Component Value Date   CREATININE 0.80 12/05/2020   BUN 19 12/05/2020   NA 140 12/05/2020   K 4.5 12/05/2020   CL 104 12/05/2020   CO2 27 12/05/2020     Hyperlipidemia Lab Results  Component Value Date   CHOL 182 12/05/2020   CHOL 156 12/04/2019   CHOL 167 12/01/2018   Lab Results  Component Value  Date   HDL 48.80 12/05/2020   HDL 43.50 12/04/2019   HDL 45.10 12/01/2018   Lab Results  Component Value Date   LDLCALC 98 12/05/2020   LDLCALC 82 12/04/2019   LDLCALC 98 12/01/2018   Lab Results  Component Value Date   TRIG 177.0 (H) 12/05/2020   TRIG 149.0 12/04/2019   TRIG 121.0 12/01/2018   Lab Results  Component Value Date   CHOLHDL 4 12/05/2020   CHOLHDL 4 12/04/2019   CHOLHDL 4 12/01/2018   Lab Results  Component Value Date   LDLDIRECT 212.8 07/02/2006  Crestor 5 mg daily  LDL and trig up a bit  Diet is not quite as good (2nd shift- more slack) -gets off track Really hungry  when she gets home    Prediabetes Lab Results  Component Value Date   HGBA1C 6.0 12/05/2020  This is unchanged   Lab Results  Component Value Date   WBC 6.1 12/05/2020   HGB 14.1 12/05/2020   HCT 42.5 12/05/2020   MCV 92.6 12/05/2020   PLT 218.0 12/05/2020   Lab Results  Component Value Date   TSH 2.85 12/05/2020    Lab Results  Component Value Date   ALT 24 12/05/2020   AST 23 12/05/2020   ALKPHOS 73 12/05/2020   BILITOT 0.5 12/05/2020      Patient Active Problem List   Diagnosis Date Noted   Colon cancer screening 12/12/2020   Medicare annual wellness visit, subsequent 08/28/2017   Estrogen deficiency 08/28/2017   Prediabetes 06/13/2015   Obesity 06/13/2015   Routine general medical examination at a health care facility 09/14/2011   STRESS REACTION, ACUTE, WITH EMOTIONAL DISTURBANCE 05/12/2008   HYPERCHOLESTEROLEMIA 04/21/2007   Essential hypertension 04/21/2007   UTI'S, RECURRENT 04/21/2007   FIBROMYALGIA 04/21/2007   RETRACTION/LAG, EYELID 09/20/2006   PARESTHESIA 09/20/2006   MRI, BRAIN, ABNORMAL 09/20/2006   Past Medical History:  Diagnosis Date   Anxiety reaction    Back pain    Fibromyalgia    History of recurrent UTIs    HLD (hyperlipidemia)    Hypertension    Past Surgical History:  Procedure Laterality Date   ABDOMINAL HYSTERECTOMY     OVARIAN CYST REMOVAL     TONSILLECTOMY     Social History   Tobacco Use   Smoking status: Never   Smokeless tobacco: Never  Substance Use Topics   Alcohol use: Yes    Alcohol/week: 0.0 standard drinks    Comment: rare   Drug use: No   Family History  Problem Relation Age of Onset   Thyroid nodules Mother    Heart disease Mother    Hypertension Father    Dementia Father    Alcohol abuse Brother    Hyperlipidemia Daughter    Hypertension Daughter    Cancer Maternal Aunt        breast   Breast cancer Maternal Aunt    Cancer Paternal Aunt        breast   Breast cancer Paternal Aunt     Allergies  Allergen Reactions   Amoxicillin-Pot Clavulanate Diarrhea    Diarrhea    Doxycycline Other (See Comments)    vomiting   Nitrofurantoin     REACTION: hives   Current Outpatient Medications on File Prior to Visit  Medication Sig Dispense Refill   chlorpheniramine-HYDROcodone (TUSSIONEX PENNKINETIC ER) 10-8 MG/5ML SUER Take 5 mLs by mouth every 12 (twelve) hours as needed for cough. Caution of sedation 120 mL 0   Cholecalciferol (  D3-1000 PO) Take 1 tablet by mouth daily.     Diclofenac Sodium CR 100 MG 24 hr tablet TAKE 1 TABLET BY MOUTH ONCE DAILY 90 tablet 1   Multiple Vitamins-Minerals (WOMENS MULTI PO) Take 1 tablet by mouth daily.       trimethoprim (TRIMPEX) 100 MG tablet Take 1 tablet (100 mg total) by mouth daily. 90 tablet 3   No current facility-administered medications on file prior to visit.     Review of Systems  Constitutional:  Negative for activity change, appetite change, fatigue, fever and unexpected weight change.  HENT:  Negative for congestion, ear pain, rhinorrhea, sinus pressure and sore throat.   Eyes:  Negative for pain, redness and visual disturbance.  Respiratory:  Negative for cough, shortness of breath and wheezing.   Cardiovascular:  Negative for chest pain and palpitations.  Gastrointestinal:  Negative for abdominal pain, blood in stool, constipation and diarrhea.  Endocrine: Negative for polydipsia and polyuria.  Genitourinary:  Negative for dysuria, frequency and urgency.  Musculoskeletal:  Negative for arthralgias, back pain and myalgias.  Skin:  Negative for pallor and rash.  Allergic/Immunologic: Negative for environmental allergies.  Neurological:  Negative for dizziness, syncope and headaches.  Hematological:  Negative for adenopathy. Does not bruise/bleed easily.  Psychiatric/Behavioral:  Negative for decreased concentration and dysphoric mood. The patient is not nervous/anxious.       Objective:   Physical Exam Constitutional:       General: She is not in acute distress.    Appearance: Normal appearance. She is well-developed. She is obese. She is not ill-appearing or diaphoretic.  HENT:     Head: Normocephalic and atraumatic.     Right Ear: Tympanic membrane, ear canal and external ear normal.     Left Ear: Tympanic membrane, ear canal and external ear normal.     Nose: Nose normal. No congestion.     Mouth/Throat:     Mouth: Mucous membranes are moist.     Pharynx: Oropharynx is clear. No posterior oropharyngeal erythema.  Eyes:     General: No scleral icterus.    Extraocular Movements: Extraocular movements intact.     Conjunctiva/sclera: Conjunctivae normal.     Pupils: Pupils are equal, round, and reactive to light.  Neck:     Thyroid: No thyromegaly.     Vascular: No carotid bruit or JVD.  Cardiovascular:     Rate and Rhythm: Normal rate and regular rhythm.     Pulses: Normal pulses.     Heart sounds: Normal heart sounds.    No gallop.  Pulmonary:     Effort: Pulmonary effort is normal. No respiratory distress.     Breath sounds: Normal breath sounds. No wheezing.     Comments: Good air exch Chest:     Chest wall: No tenderness.  Abdominal:     General: Bowel sounds are normal. There is no distension or abdominal bruit.     Palpations: Abdomen is soft. There is no mass.     Tenderness: There is no abdominal tenderness.     Hernia: No hernia is present.  Genitourinary:    Comments: Breast exam: No mass, nodules, thickening, tenderness, bulging, retraction, inflamation, nipple discharge or skin changes noted.  No axillary or clavicular LA.     Musculoskeletal:        General: No tenderness. Normal range of motion.     Cervical back: Normal range of motion and neck supple. No rigidity. No muscular tenderness.  Right lower leg: No edema.     Left lower leg: No edema.     Comments: No kyphosis   Lymphadenopathy:     Cervical: No cervical adenopathy.  Skin:    General: Skin is warm and dry.      Coloration: Skin is not pale.     Findings: No erythema or rash.     Comments: Solar lentigines diffusely   Neurological:     Mental Status: She is alert. Mental status is at baseline.     Cranial Nerves: No cranial nerve deficit.     Motor: No abnormal muscle tone.     Coordination: Coordination normal.     Gait: Gait normal.     Deep Tendon Reflexes: Reflexes are normal and symmetric. Reflexes normal.  Psychiatric:        Mood and Affect: Mood normal.        Cognition and Memory: Cognition and memory normal.     Comments: Mentally sharp            Assessment & Plan:  . Problem List Items Addressed This Visit       Cardiovascular and Mediastinum   Essential hypertension    bp in fair control at this time  BP Readings from Last 1 Encounters:  12/12/20 136/80  No changes needed Most recent labs reviewed  Disc lifstyle change with low sodium diet and exercise  Plan to continue tenormin 25 mg daily       Relevant Medications   atenolol (TENORMIN) 25 MG tablet   rosuvastatin (CRESTOR) 5 MG tablet     Other   HYPERCHOLESTEROLEMIA    Disc goals for lipids and reasons to control them Rev last labs with pt Rev low sat fat diet in detail LDL and trig up a bit  Diet not as good but will work on it Plan to continue crestor 5 mg daily       Relevant Medications   atenolol (TENORMIN) 25 MG tablet   rosuvastatin (CRESTOR) 5 MG tablet   Routine general medical examination at a health care facility    Reviewed health habits including diet and exercise and skin cancer prevention Reviewed appropriate screening tests for age  Also reviewed health mt list, fam hx and immunization status , as well as social and family history   See HPI Labs reviewed  Colonoscopy ordered  Mammogram utd  Declines flu vaccine or covid booster Declines shingrix  dexa utd -normal, no falls or fx Adv directive-given materials to work on  No cognitive concerns at all  Nl hearing and  vision screens (with monovision contacts)       Prediabetes    Lab Results  Component Value Date   HGBA1C 6.0 12/05/2020  disc imp of low glycemic diet and wt loss to prevent DM2       Obesity    Discussed how this problem influences overall health and the risks it imposes  Reviewed plan for weight loss with lower calorie diet (via better food choices and also portion control or program like weight watchers) and exercise building up to or more than 30 minutes 5 days per week including some aerobic activity          Medicare annual wellness visit, subsequent - Primary    Reviewed health habits including diet and exercise and skin cancer prevention Reviewed appropriate screening tests for age  Also reviewed health mt list, fam hx and immunization status , as well as social and  family history   See HPI Labs reviewed  Colonoscopy ordered  Mammogram utd  Declines flu vaccine or covid booster Declines shingrix  dexa utd -normal, no falls or fx Adv directive-given materials to work on  No cognitive concerns at all  Nl hearing and vision screens (with monovision contacts)        Colon cancer screening    Due for screening colonoscopy  Referral done       Relevant Orders   Ambulatory referral to Gastroenterology

## 2020-12-12 NOTE — Assessment & Plan Note (Signed)
bp in fair control at this time  BP Readings from Last 1 Encounters:  12/12/20 136/80   No changes needed Most recent labs reviewed  Disc lifstyle change with low sodium diet and exercise  Plan to continue tenormin 25 mg daily

## 2020-12-12 NOTE — Assessment & Plan Note (Signed)
Due for screening colonoscopy  Referral done

## 2020-12-19 ENCOUNTER — Encounter: Payer: Self-pay | Admitting: Gastroenterology

## 2020-12-20 ENCOUNTER — Other Ambulatory Visit: Payer: Self-pay | Admitting: Family Medicine

## 2020-12-21 NOTE — Telephone Encounter (Signed)
CPE was on 12/12/20, trimpex last filled on 12/11/19 #90 tabs with 3 refills, and pt's diclofenac was last filled on 06/24/20 #90 tabs with 1 refill

## 2021-01-24 ENCOUNTER — Telehealth (INDEPENDENT_AMBULATORY_CARE_PROVIDER_SITE_OTHER): Payer: PPO | Admitting: Family Medicine

## 2021-01-24 ENCOUNTER — Other Ambulatory Visit: Payer: Self-pay

## 2021-01-24 ENCOUNTER — Encounter: Payer: Self-pay | Admitting: Family Medicine

## 2021-01-24 DIAGNOSIS — U071 COVID-19: Secondary | ICD-10-CM | POA: Insufficient documentation

## 2021-01-24 MED ORDER — HYDROCOD POLST-CPM POLST ER 10-8 MG/5ML PO SUER: 5.0000 mL | Freq: Two times a day (BID) | ORAL | 0 refills | Status: DC | PRN

## 2021-01-24 MED ORDER — NIRMATRELVIR/RITONAVIR (PAXLOVID)TABLET: 3.0000 | ORAL_TABLET | Freq: Two times a day (BID) | ORAL | 0 refills | Status: AC

## 2021-01-24 NOTE — Progress Notes (Signed)
Virtual Visit via Video Note  I connected with Amy Mills on 01/24/21 at  8:00 AM EDT by a video enabled telemedicine application and verified that I am speaking with the correct person using two identifiers.  Location: Patient: home Provider: office   I discussed the limitations of evaluation and management by telemedicine and the availability of in person appointments. The patient expressed understanding and agreed to proceed.  Parties involved in encounter  Patient: Amy Mills  Provider:  Roxy Manns MD   History of Present Illness: Pt presents with positive test for covid 19    Thursday/Friday last week felt tired   (daughter had appy surgery on wed and she had to take care of kids)  No one else is sick   Sat am woke up feeling achy and bad  Low grade temp 100.1 Covid test popped up positive  Really bad ST  Some mild nasal congestion  Cough worse at night - dry / scant rattle but nothing comes up No wheezing Not sob   No n/v/d No change in sense of taste or smell  OTC: Thera flu severe cold  Advil/tylenol as needed    Imm status- covid immunized w/o booster  Patient Active Problem List   Diagnosis Date Noted   COVID-19 01/24/2021   Colon cancer screening 12/12/2020   Medicare annual wellness visit, subsequent 08/28/2017   Estrogen deficiency 08/28/2017   Prediabetes 06/13/2015   Obesity 06/13/2015   Routine general medical examination at a health care facility 09/14/2011   STRESS REACTION, ACUTE, WITH EMOTIONAL DISTURBANCE 05/12/2008   HYPERCHOLESTEROLEMIA 04/21/2007   Essential hypertension 04/21/2007   UTI'S, RECURRENT 04/21/2007   FIBROMYALGIA 04/21/2007   RETRACTION/LAG, EYELID 09/20/2006   PARESTHESIA 09/20/2006   MRI, BRAIN, ABNORMAL 09/20/2006   Past Medical History:  Diagnosis Date   Anxiety reaction    Back pain    Fibromyalgia    History of recurrent UTIs    HLD (hyperlipidemia)    Hypertension    Past Surgical History:  Procedure  Laterality Date   ABDOMINAL HYSTERECTOMY     OVARIAN CYST REMOVAL     TONSILLECTOMY     Social History   Tobacco Use   Smoking status: Never   Smokeless tobacco: Never  Substance Use Topics   Alcohol use: Yes    Alcohol/week: 0.0 standard drinks    Comment: rare   Drug use: No   Family History  Problem Relation Age of Onset   Thyroid nodules Mother    Heart disease Mother    Hypertension Father    Dementia Father    Alcohol abuse Brother    Hyperlipidemia Daughter    Hypertension Daughter    Cancer Maternal Aunt        breast   Breast cancer Maternal Aunt    Cancer Paternal Aunt        breast   Breast cancer Paternal Aunt    Allergies  Allergen Reactions   Amoxicillin-Pot Clavulanate Diarrhea    Diarrhea    Doxycycline Other (See Comments)    vomiting   Nitrofurantoin     REACTION: hives   Current Outpatient Medications on File Prior to Visit  Medication Sig Dispense Refill   atenolol (TENORMIN) 25 MG tablet Take 1 tablet (25 mg total) by mouth daily. 90 tablet 3   Cholecalciferol (D3-1000 PO) Take 1 tablet by mouth daily.     Diclofenac Sodium CR 100 MG 24 hr tablet Take 1 tablet (100 mg total) by  mouth daily. As needed for pain with food 90 tablet 1   Multiple Vitamins-Minerals (WOMENS MULTI PO) Take 1 tablet by mouth daily.       rosuvastatin (CRESTOR) 5 MG tablet TAKE 1 TABLET BY MOUTH ONCE DAILY 90 tablet 3   SUMAtriptan (IMITREX) 50 MG tablet TAKE 1 TABLET EVERY 2 HOURS AS NEEDED FOR MIGRAINE--MAX 2 DOSES PER DAY 10 tablet 11   trimethoprim (TRIMPEX) 100 MG tablet Take 1 tablet (100 mg total) by mouth daily. 90 tablet 3   No current facility-administered medications on file prior to visit.   Review of Systems  Constitutional:  Positive for fever and malaise/fatigue. Negative for chills.  HENT:  Positive for congestion and sore throat. Negative for ear pain and sinus pain.   Eyes:  Negative for blurred vision, discharge and redness.  Respiratory:  Positive  for cough. Negative for sputum production, shortness of breath, wheezing and stridor.   Cardiovascular:  Negative for chest pain, palpitations and leg swelling.  Gastrointestinal:  Negative for abdominal pain, diarrhea, nausea and vomiting.  Musculoskeletal:  Negative for myalgias.  Skin:  Negative for rash.  Neurological:  Negative for dizziness and headaches.   Observations/Objective: Patient appears well, in no distress Weight is baseline  No facial swelling or asymmetry Mildly hoarse voice No obvious tremor or mobility impairment Moving neck and UEs normally Able to hear the call well  Occ dry sounding cough (not sob and no wheeze noted with conversation) Talkative and mentally sharp with no cognitive changes No skin changes on face or neck , no rash or pallor Affect is normal    Assessment and Plan: Problem List Items Addressed This Visit       Other   COVID-19    With mild to moderate symptoms Discussed symptom control  Fluids/rest  Px paxlovid to take as directed/poss side eff discussed  tussionex for cough prn with caution fo sedation and habit Update if not starting to improve in a week or if worsening  ER precautions discussed       Relevant Medications   nirmatrelvir/ritonavir EUA (PAXLOVID) 20 x 150 MG & 10 x 100MG  TABS     Follow Up Instructions: Problem List Items Addressed This Visit       Other   COVID-19    With mild to moderate symptoms Discussed symptom control  Fluids/rest  Px paxlovid to take as directed/poss side eff discussed  tussionex for cough prn with caution fo sedation and habit Update if not starting to improve in a week or if worsening  ER precautions discussed       Relevant Medications   nirmatrelvir/ritonavir EUA (PAXLOVID) 20 x 150 MG & 10 x 100MG  TABS      I discussed the assessment and treatment plan with the patient. The patient was provided an opportunity to ask questions and all were answered. The patient agreed with  the plan and demonstrated an understanding of the instructions.   The patient was advised to call back or seek an in-person evaluation if the symptoms worsen or if the condition fails to improve as anticipated.     , MD

## 2021-01-24 NOTE — Patient Instructions (Addendum)
Stay out of work until symptoms are gone  Rest and fluids  Can try chloraseptic products for sore throat  Take paxlovid /anti viral  Tussionex for cough (caution of sedation and habit)  Update if not starting to improve in a week or if worsening  -especially if any problems breathing (go to ER)

## 2021-01-24 NOTE — Assessment & Plan Note (Signed)
With mild to moderate symptoms Discussed symptom control  Fluids/rest  Px paxlovid to take as directed/poss side eff discussed  tussionex for cough prn with caution fo sedation and habit Update if not starting to improve in a week or if worsening  ER precautions discussed

## 2021-01-28 ENCOUNTER — Other Ambulatory Visit: Payer: Self-pay

## 2021-01-28 ENCOUNTER — Emergency Department (HOSPITAL_COMMUNITY)
Admission: EM | Admit: 2021-01-28 | Discharge: 2021-01-29 | Disposition: A | Discharge status: Home / Self Care | Payer: PPO | Attending: Emergency Medicine | Admitting: Emergency Medicine

## 2021-01-28 ENCOUNTER — Encounter (HOSPITAL_COMMUNITY): Payer: Self-pay

## 2021-01-28 DIAGNOSIS — Z8616 Personal history of COVID-19: Secondary | ICD-10-CM | POA: Diagnosis not present

## 2021-01-28 DIAGNOSIS — Z79899 Other long term (current) drug therapy: Secondary | ICD-10-CM | POA: Insufficient documentation

## 2021-01-28 DIAGNOSIS — I10 Essential (primary) hypertension: Secondary | ICD-10-CM | POA: Diagnosis not present

## 2021-01-28 DIAGNOSIS — M25562 Pain in left knee: Secondary | ICD-10-CM | POA: Diagnosis not present

## 2021-01-28 NOTE — ED Triage Notes (Signed)
Pt in from home d/t worsening left knee pain. Pt stated she got up to walk and heard a pop on the left knee. PTA, pt took advil and tylenol. Pt states pain is a dull pain at this time. Pt did not fall, but has difficulty flexing extremity.

## 2021-01-29 ENCOUNTER — Emergency Department (HOSPITAL_COMMUNITY): Payer: PPO

## 2021-01-29 DIAGNOSIS — M25562 Pain in left knee: Secondary | ICD-10-CM | POA: Diagnosis not present

## 2021-01-29 MED ORDER — DICLOFENAC SODIUM 1 % EX GEL: 2.0000 g | Freq: Four times a day (QID) | CUTANEOUS | 0 refills | Status: DC

## 2021-01-29 NOTE — Progress Notes (Signed)
Orthopedic Tech Progress Note Patient Details:  Amy Mills 14-Mar-1951 [id number]  Ortho Devices Type of Ortho Device: Crutches, Knee Immobilizer Ortho Device/Splint Location: lle Ortho Device/Splint Interventions: Ordered, Application, Adjustment   Post Interventions Patient Tolerated: Well Instructions Provided: Care of device, Adjustment of device  Trinna Post 01/29/2021, 4:03 AM

## 2021-01-29 NOTE — ED Provider Notes (Signed)
[facility] EMERGENCY DEPARTMENT Provider Note   CSN: [id number] Arrival date & time: 01/28/21  2255     History Chief Complaint  Patient presents with   Knee Pain    Amy Mills is a 70 y.o. female presents to the emergency department with complaints of left knee pain.  Reports it has been bothering her intermittently for the last several months worsening over the last several weeks.  Tonight she was walking up a set of stairs and when she reached the top step and put weight on her left leg she had a sharp pain, popping sensation in her knee and severe pain.  She did not fall or hit her head.  Reports no dislocation of the knee.  No numbness, tingling or weakness at that time but states it has been too painful to ambulate since then.  No treatments prior to arrival.  Movement and palpation make it worse.  Rest makes the pain better.  The history is provided by the patient and medical records. No language interpreter was used.      Past Medical History:  Diagnosis Date   Anxiety reaction    Back pain    Fibromyalgia    History of recurrent UTIs    HLD (hyperlipidemia)    Hypertension     Patient Active Problem List   Diagnosis Date Noted   COVID-19 01/24/2021   Colon cancer screening 12/12/2020   Medicare annual wellness visit, subsequent 08/28/2017   Estrogen deficiency 08/28/2017   Prediabetes 06/13/2015   Obesity 06/13/2015   Routine general medical examination at a health care facility 09/14/2011   STRESS REACTION, ACUTE, WITH EMOTIONAL DISTURBANCE 05/12/2008   HYPERCHOLESTEROLEMIA 04/21/2007   Essential hypertension 04/21/2007   UTI'S, RECURRENT 04/21/2007   FIBROMYALGIA 04/21/2007   RETRACTION/LAG, EYELID 09/20/2006   PARESTHESIA 09/20/2006   MRI, BRAIN, ABNORMAL 09/20/2006    Past Surgical History:  Procedure Laterality Date   ABDOMINAL HYSTERECTOMY     OVARIAN CYST REMOVAL     TONSILLECTOMY       OB History   No obstetric history on  file.     Family History  Problem Relation Age of Onset   Thyroid nodules Mother    Heart disease Mother    Hypertension Father    Dementia Father    Alcohol abuse Brother    Hyperlipidemia Daughter    Hypertension Daughter    Cancer Maternal Aunt        breast   Breast cancer Maternal Aunt    Cancer Paternal Aunt        breast   Breast cancer Paternal Aunt     Social History   Tobacco Use   Smoking status: Never   Smokeless tobacco: Never  Substance Use Topics   Alcohol use: Yes    Alcohol/week: 0.0 standard drinks    Comment: rare   Drug use: No    Home Medications Prior to Admission medications   Medication Sig Start Date End Date Taking? Authorizing Provider  diclofenac Sodium (VOLTAREN) 1 % GEL Apply 2 g topically 4 (four) times daily. 01/29/21  Yes Raneem Mendolia, Dahlia Client, PA-C  atenolol (TENORMIN) 25 MG tablet Take 1 tablet (25 mg total) by mouth daily. 12/12/20   Tower, Audrie Gallus, MD  chlorpheniramine-HYDROcodone (TUSSIONEX PENNKINETIC ER) 10-8 MG/5ML SUER Take 5 mLs by mouth every 12 (twelve) hours as needed for cough. 01/24/21   Tower, Audrie Gallus, MD  Cholecalciferol (D3-1000 PO) Take 1 tablet by mouth daily.  [provider]  Diclofenac Sodium CR 100 MG 24 hr tablet Take 1 tablet (100 mg total) by mouth daily. As needed for pain with food 12/21/20   Tower, Audrie Gallus, MD  Multiple Vitamins-Minerals (WOMENS MULTI PO) Take 1 tablet by mouth daily.      [provider]  nirmatrelvir/ritonavir EUA (PAXLOVID) 20 x 150 MG & 10 x 100MG  TABS Take 3 tablets by mouth 2 (two) times daily for 5 days. (Take nirmatrelvir 150 mg two tablets twice daily for 5 days and ritonavir 100 mg one tablet twice daily for 5 days) Patient GFR is 74.9 01/24/21 01/29/21  Tower, Marne A, MD  rosuvastatin (CRESTOR) 5 MG tablet TAKE 1 TABLET BY MOUTH ONCE DAILY 12/12/20   Tower, 02/11/21, MD  SUMAtriptan (IMITREX) 50 MG tablet TAKE 1 TABLET EVERY 2 HOURS AS NEEDED FOR MIGRAINE--MAX 2 DOSES PER  DAY 12/12/20   Tower, Marne A, MD  trimethoprim (TRIMPEX) 100 MG tablet Take 1 tablet (100 mg total) by mouth daily. 12/21/20   Tower, 12/23/20, MD    Allergies    Amoxicillin-pot clavulanate, Doxycycline, and Nitrofurantoin  Review of Systems   Review of Systems  Constitutional:  Negative for chills and fever.  Musculoskeletal:  Positive for arthralgias, gait problem (2/2 pain) and joint swelling.  Skin:  Negative for wound.   Physical Exam Updated Vital Signs BP (!) 155/89   Pulse 88   Temp 98.7 F (37.1 C) (Oral)   Resp 15   Ht 5' 2.5" (1.588 m)   Wt 79.4 kg   LMP 05/08/1995   SpO2 99%   BMI 31.50 kg/m   Physical Exam Vitals and nursing note reviewed.  Constitutional:      General: She is not in acute distress.    Appearance: She is well-developed. She is not ill-appearing.  HENT:     Head: Normocephalic.  Eyes:     General: No scleral icterus.    Conjunctiva/sclera: Conjunctivae normal.  Cardiovascular:     Rate and Rhythm: Normal rate.  Pulmonary:     Effort: Pulmonary effort is normal.  Abdominal:     General: There is no distension.  Musculoskeletal:     Cervical back: Normal range of motion.     Right hip: Normal.     Left hip: Normal.     Right knee: Normal.     Left knee: No swelling, deformity, effusion, erythema or ecchymosis. Decreased range of motion. Tenderness (generalized) present. No patellar tendon tenderness. Normal alignment and normal patellar mobility.     Left lower leg: Normal.     Right ankle: Normal.     Left ankle: Normal.  Skin:    General: Skin is warm and dry.  Neurological:     Mental Status: She is alert.     Comments: Station intact to normal touch in the bilateral lower extremities.  Strength 5/5 in the bilateral lower extremities including dorsiflexion and plantarflexion.  Strength/5 at the left knee with pain.  Ambulation not attempted due to pain.  Psychiatric:        Mood and Affect: Mood normal.    ED Results /  Procedures / Treatments     Radiology DG Knee 2 Views Left  Result Date: 01/29/2021 CLINICAL DATA:  Left knee pain EXAM: LEFT KNEE - 1-2 VIEW COMPARISON:  None. FINDINGS: Normal alignment. No fracture or dislocation. Mild tricompartmental degenerative arthritis with small osteophyte formation. No effusion. Soft tissues are unremarkable. IMPRESSION: Mild degenerative arthritis.  No acute fracture or dislocation. Electronically Signed   By: Helyn Numbers M.D.   On: 01/29/2021 01:36    Procedures Procedures   Medications Ordered in ED Medications - No data to display  ED Course  I have reviewed the triage vital signs and the nursing notes.  Pertinent labs & imaging results that were available during my care of the patient were reviewed by me and considered in my medical decision making (see chart for details).    MDM Rules/Calculators/A&P                           Patient X-Ray negative for obvious fracture or dislocation.  Patient declined pain medication.  Pt advised to follow up with orthopedics for further evaluation as I suspect likely ligamentous injury. Patient given brace while in ED, conservative therapy recommended and discussed.  Patient takes diclofenac.  I have encouraged her to continue this and have added topical Voltaren as well.  Patient will be dc home & is agreeable with above plan.   Final Clinical Impression(s) / ED Diagnoses Final diagnoses:  Acute pain of left knee    Rx / DC Orders ED Discharge Orders          Ordered    diclofenac Sodium (VOLTAREN) 1 % GEL  4 times daily        01/29/21 0241             Damilola Flamm, Boyd Kerbs 01/29/21 0246    Zadie Rhine, MD 01/29/21 [phone]

## 2021-01-29 NOTE — Discharge Instructions (Addendum)
1. Medications: use home diclofenac for pain control, topical Voltaren as well, usual home medications 2. Treatment: rest, ice, elevate and use brace, drink plenty of fluids, gentle stretching 3. Follow Up: Please followup with orthopedics as directed for discussion of your diagnoses and further evaluation after today's visit; if you do not have a primary care doctor use the resource guide provided to find one; Please return to the ER for worsening symptoms or other concerns

## 2021-01-31 ENCOUNTER — Ambulatory Visit: Payer: PPO | Admitting: Orthopaedic Surgery

## 2021-01-31 ENCOUNTER — Other Ambulatory Visit: Payer: Self-pay

## 2021-01-31 ENCOUNTER — Encounter: Payer: Self-pay | Admitting: Orthopaedic Surgery

## 2021-01-31 DIAGNOSIS — M25562 Pain in left knee: Secondary | ICD-10-CM | POA: Diagnosis not present

## 2021-01-31 NOTE — Progress Notes (Signed)
The patient is a very pleasant and active 70 year old female who comes today with acute onset of left knee pain.  She was seen in the emergency room a few days ago after feeling a significant pop in her knee on the left side.  She has had some chronic off-and-on knee pain but this was more significant event happened in the back of the knee.  Since then she has had trouble with weightbearing on the left knee and has been having to get around with crutches.  X-rays were performed in the emergency room.  I was able to review his x-rays that showed no acute findings.  There is no soft tissue swelling or effusion in the knee joint space is well-maintained at the medial lateral compartments.  She is taking Tylenol and Advil for pain.  She is never had surgery on her left knee and has never had any type of injection in the knee.  She is otherwise very active and healthy 70 year old female.  She is not on blood thinning medications either.  Examination of her left knee shows no effusion but she has significant guarding with flexion and extension of that knee on the left side.  When she start flexion past 90 degrees she has significant pain in the posterior aspect of that left knee.  There is no palpable cord or bruising in the knee feels stable but the exam is certainly difficult secondary to her pain which is certainly appropriate for her to have the pain she is having given the pop that she felt in her left knee.  We will put her in a hinged knee brace today for extra support and a MRI will be ordered of the left knee to rule out an internal derangement which could certainly be a stress fracture that were looking for versus a meniscal tear or even a gastrocs tear given the amount of pain that she is having.  She will continue to limit her activities and we will see her back in follow-up once we have the MRI of her left knee.  All questions and concerns were answered and addressed.

## 2021-01-31 NOTE — Addendum Note (Signed)
Addended by: Barbette Or on: 01/31/2021 10:35 AM   Modules accepted: Orders

## 2021-02-07 ENCOUNTER — Ambulatory Visit
Admission: RE | Admit: 2021-02-07 | Discharge: 2021-02-07 | Disposition: A | Discharge status: Home / Self Care | Payer: PPO | Source: Ambulatory Visit | Attending: Orthopaedic Surgery | Admitting: Orthopaedic Surgery

## 2021-02-07 ENCOUNTER — Other Ambulatory Visit: Payer: Self-pay

## 2021-02-07 DIAGNOSIS — M25562 Pain in left knee: Secondary | ICD-10-CM

## 2021-02-07 DIAGNOSIS — S83272A Complex tear of lateral meniscus, current injury, left knee, initial encounter: Secondary | ICD-10-CM | POA: Diagnosis not present

## 2021-02-07 DIAGNOSIS — S83242A Other tear of medial meniscus, current injury, left knee, initial encounter: Secondary | ICD-10-CM | POA: Diagnosis not present

## 2021-02-07 DIAGNOSIS — M25462 Effusion, left knee: Secondary | ICD-10-CM | POA: Diagnosis not present

## 2021-02-14 ENCOUNTER — Ambulatory Visit (INDEPENDENT_AMBULATORY_CARE_PROVIDER_SITE_OTHER): Payer: PPO | Admitting: Physician Assistant

## 2021-02-14 ENCOUNTER — Encounter: Payer: Self-pay | Admitting: Physician Assistant

## 2021-02-14 DIAGNOSIS — S83282D Other tear of lateral meniscus, current injury, left knee, subsequent encounter: Secondary | ICD-10-CM | POA: Diagnosis not present

## 2021-02-14 NOTE — Progress Notes (Signed)
HPI: [name] returns today to go over the MRI of her left knee.  States knee is doing a little better still has some occasional painful pop.  No catching locking.  She does note she has occasional pain in the knee but nothing like the pain she is experiencing now. MRI images are reviewed with the patient MRI dated 02/08/2021.  MRI shows large radial tear involving the posterior horn of the medial meniscus with extrusion.  Lateral meniscus with a large radial tear posterior horn of the meniscus and a complex tear involving the body of the lateral meniscus.  All 3 compartments with high-grade partial thickness cartilage loss patellofemoral with some areas of full-thickness cartilage loss involving the lateral patellar facet.  Physical exam: Left knee good range of motion.  She has patellofemoral crepitus passive range of motion.  Calf supple nontender.  Impression: Left knee meniscal tears Left knee osteoarthritis  Plan: Discussed with patient findings of MRI at length.  Fact the matter she is having no significant pain that was influencing her life prior to the the pop that she had in her knee.  The MRI did show meniscal tears medial and lateral meniscus.  However she does have arthritis in the knee which is worse than her plain films.  She states she had some pain on and off in the knee prior to injury but nothing that she could she could not handle.  She still has significant painful pop in the knee which is limiting her activities.  Recommend left knee arthroscopy with partial meniscectomies and extensive debridement.  Discussed risk benefits of surgery.  Discussed pain.  Reviewed postoperative protocol with her.  Questions were encouraged and answered at length.  Risk benefits of surgery discussed with patient risk include infection worsening pain, and DVT/PE.  She will follow-up with [place] 1 week after surgery.

## 2021-02-27 ENCOUNTER — Encounter: Payer: PPO | Admitting: Gastroenterology

## 2021-03-16 ENCOUNTER — Other Ambulatory Visit: Payer: Self-pay | Admitting: Orthopaedic Surgery

## 2021-03-16 DIAGNOSIS — G8918 Other acute postprocedural pain: Secondary | ICD-10-CM | POA: Diagnosis not present

## 2021-03-16 DIAGNOSIS — S83232D Complex tear of medial meniscus, current injury, left knee, subsequent encounter: Secondary | ICD-10-CM | POA: Diagnosis not present

## 2021-03-16 DIAGNOSIS — S83272D Complex tear of lateral meniscus, current injury, left knee, subsequent encounter: Secondary | ICD-10-CM | POA: Diagnosis not present

## 2021-03-16 DIAGNOSIS — S83272A Complex tear of lateral meniscus, current injury, left knee, initial encounter: Secondary | ICD-10-CM | POA: Diagnosis not present

## 2021-03-16 DIAGNOSIS — X58XXXA Exposure to other specified factors, initial encounter: Secondary | ICD-10-CM | POA: Diagnosis not present

## 2021-03-16 DIAGNOSIS — Y999 Unspecified external cause status: Secondary | ICD-10-CM | POA: Diagnosis not present

## 2021-03-16 DIAGNOSIS — S83232A Complex tear of medial meniscus, current injury, left knee, initial encounter: Secondary | ICD-10-CM | POA: Diagnosis not present

## 2021-03-16 DIAGNOSIS — M94262 Chondromalacia, left knee: Secondary | ICD-10-CM | POA: Diagnosis not present

## 2021-03-16 MED ORDER — HYDROCODONE-ACETAMINOPHEN 5-325 MG PO TABS: 1.0000 | ORAL_TABLET | Freq: Four times a day (QID) | ORAL | 0 refills | Status: DC | PRN

## 2021-03-16 MED ORDER — ONDANSETRON 4 MG PO TBDP: 4.0000 mg | ORAL_TABLET | Freq: Three times a day (TID) | ORAL | 0 refills | Status: DC | PRN

## 2021-03-23 ENCOUNTER — Encounter: Payer: Self-pay | Admitting: Orthopaedic Surgery

## 2021-03-23 ENCOUNTER — Ambulatory Visit (INDEPENDENT_AMBULATORY_CARE_PROVIDER_SITE_OTHER): Payer: PPO | Admitting: Orthopaedic Surgery

## 2021-03-23 ENCOUNTER — Other Ambulatory Visit: Payer: Self-pay

## 2021-03-23 DIAGNOSIS — S83282D Other tear of lateral meniscus, current injury, left knee, subsequent encounter: Secondary | ICD-10-CM

## 2021-03-23 DIAGNOSIS — Z9889 Other specified postprocedural states: Secondary | ICD-10-CM

## 2021-03-23 NOTE — Progress Notes (Signed)
The patient is now 1 week status post a left knee arthroscopy.  She had a significant medial meniscal tear.  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  Her ACL PCL are intact.  She is a very young appearing 70 year old female and is very active.  She reports really no pain after surgery and is very happy with her results thus far.  The sutures look good today been removed from her incision sites with her left knee.  There is some bruising but surprisingly minimal swelling and good range of motion of her knee.  She is walking good as well.  She is in a job where she is on her feet all day long.  I would like to keep her out of work through next week and allow her to return to work on 28 November in order to adequately rest her knee.  She will work on Dance movement psychotherapist exercises.  Eventually she may be a good candidate for hyaluronic acid given the cartilage loss in her knee.  We will see her back in 4 weeks to see how she is doing overall.  All question concerns were answered and addressed.

## 2021-03-28 DIAGNOSIS — H2513 Age-related nuclear cataract, bilateral: Secondary | ICD-10-CM | POA: Diagnosis not present

## 2021-04-19 ENCOUNTER — Ambulatory Visit (INDEPENDENT_AMBULATORY_CARE_PROVIDER_SITE_OTHER): Payer: PPO | Admitting: Orthopaedic Surgery

## 2021-04-19 ENCOUNTER — Encounter: Payer: Self-pay | Admitting: Orthopaedic Surgery

## 2021-04-19 DIAGNOSIS — Z9889 Other specified postprocedural states: Secondary | ICD-10-CM

## 2021-04-19 NOTE — Progress Notes (Signed)
The patient is a very active 70 year old female who is in recovery status post a left knee arthroscopy.  This was done just over a month ago.  She is back to work as well.  She reports a pinching sensation in the back of her knee with going up and down stairs but overall she feels great.  On exam there is no knee joint effusion of the left operative knee.  Both knees hyperextend.  She has good motion of both knees and a little bit of posterior pain to be expected post arthroscopic intervention where she did have meniscal tearing.  She will work on Dance movement psychotherapist exercises.  I told her this should calm down with time.  The only other thing I would consider is a repeat steroid injection down the road or even hyaluronic acid given the chondromalacia that she has.  She will continue to work on activities and increasing dose as tolerated.  All questions and concerns were answered and addressed.  Follow-up is as needed.

## 2021-07-11 ENCOUNTER — Other Ambulatory Visit: Payer: Self-pay | Admitting: Family Medicine

## 2021-07-11 DIAGNOSIS — Z1231 Encounter for screening mammogram for malignant neoplasm of breast: Secondary | ICD-10-CM

## 2021-07-14 ENCOUNTER — Other Ambulatory Visit: Payer: Self-pay | Admitting: Family Medicine

## 2021-08-08 ENCOUNTER — Ambulatory Visit
Admission: RE | Admit: 2021-08-08 | Discharge: 2021-08-08 | Disposition: A | Discharge status: Home / Self Care | Payer: Medicare Other | Source: Ambulatory Visit | Attending: Family Medicine | Admitting: Family Medicine

## 2021-08-08 DIAGNOSIS — Z1231 Encounter for screening mammogram for malignant neoplasm of breast: Secondary | ICD-10-CM | POA: Diagnosis not present

## 2021-08-09 ENCOUNTER — Other Ambulatory Visit: Payer: Self-pay | Admitting: Family Medicine

## 2021-08-09 DIAGNOSIS — R928 Other abnormal and inconclusive findings on diagnostic imaging of breast: Secondary | ICD-10-CM

## 2021-08-25 ENCOUNTER — Ambulatory Visit
Admission: RE | Admit: 2021-08-25 | Discharge: 2021-08-25 | Disposition: A | Discharge status: Home / Self Care | Payer: Medicare Other | Source: Ambulatory Visit | Attending: Family Medicine | Admitting: Family Medicine

## 2021-08-25 ENCOUNTER — Other Ambulatory Visit: Payer: Self-pay | Admitting: Family Medicine

## 2021-08-25 DIAGNOSIS — R928 Other abnormal and inconclusive findings on diagnostic imaging of breast: Secondary | ICD-10-CM | POA: Diagnosis not present

## 2021-08-25 DIAGNOSIS — N6314 Unspecified lump in the right breast, lower inner quadrant: Secondary | ICD-10-CM | POA: Diagnosis not present

## 2021-08-25 DIAGNOSIS — N6310 Unspecified lump in the right breast, unspecified quadrant: Secondary | ICD-10-CM

## 2021-09-05 ENCOUNTER — Ambulatory Visit
Admission: RE | Admit: 2021-09-05 | Discharge: 2021-09-05 | Disposition: A | Discharge status: Home / Self Care | Payer: Medicare Other | Source: Ambulatory Visit | Attending: Family Medicine | Admitting: Family Medicine

## 2021-09-05 DIAGNOSIS — N6310 Unspecified lump in the right breast, unspecified quadrant: Secondary | ICD-10-CM

## 2021-09-06 ENCOUNTER — Ambulatory Visit
Admission: RE | Admit: 2021-09-06 | Discharge: 2021-09-06 | Disposition: A | Discharge status: Home / Self Care | Payer: Medicare Other | Source: Ambulatory Visit | Attending: Family Medicine | Admitting: Family Medicine

## 2021-09-06 DIAGNOSIS — C50311 Malignant neoplasm of lower-inner quadrant of right female breast: Secondary | ICD-10-CM | POA: Diagnosis not present

## 2021-09-06 DIAGNOSIS — Z171 Estrogen receptor negative status [ER-]: Secondary | ICD-10-CM | POA: Diagnosis not present

## 2021-09-06 DIAGNOSIS — N6310 Unspecified lump in the right breast, unspecified quadrant: Secondary | ICD-10-CM | POA: Diagnosis not present

## 2021-09-06 DIAGNOSIS — N6314 Unspecified lump in the right breast, lower inner quadrant: Secondary | ICD-10-CM | POA: Diagnosis not present

## 2021-09-11 ENCOUNTER — Telehealth: Payer: Self-pay | Admitting: Family Medicine

## 2021-09-11 DIAGNOSIS — R897 Abnormal histological findings in specimens from other organs, systems and tissues: Secondary | ICD-10-CM

## 2021-09-11 NOTE — Telephone Encounter (Signed)
Reason for Referral Request: Pt states it is "Grade 3" ? ?Has patient been seen PCP for this complaint? ?Pt states Dr. Glori Bickers is aware of this. ? ?Patient scheduled on: 12/12/2020 with Dr. Glori Bickers.  ?Mammogram was on 08/25/2021 ? ?Referral for which specialty:  [facility] ? ?Preferred office/provider: [facility]  ? ?  ?

## 2021-09-12 ENCOUNTER — Encounter: Payer: Self-pay | Admitting: *Deleted

## 2021-09-12 DIAGNOSIS — Z853 Personal history of malignant neoplasm of breast: Secondary | ICD-10-CM | POA: Insufficient documentation

## 2021-09-12 DIAGNOSIS — R897 Abnormal histological findings in specimens from other organs, systems and tissues: Secondary | ICD-10-CM | POA: Insufficient documentation

## 2021-09-12 NOTE — Telephone Encounter (Signed)
Biopsy was already done (placed in your inbox) would like oncology care from [facility] ?

## 2021-09-12 NOTE — Telephone Encounter (Signed)
I placed an urgent ref to onc in the chart ?Tell her to call us if she does not hear back in several days  ? ?Geronimo Running is aware ? ?Please ask pt if there is anything else she needs  ?

## 2021-09-12 NOTE — Telephone Encounter (Signed)
So she prefers the biopsy be done by [facility]?   Is this correct? ?Does she know where she wants to go within [facility]?  ?I think [facility] need [facility] (I'm not sure who sends there but she may know)  ? ? ?Thanks for clarifying  ?

## 2021-09-12 NOTE — Telephone Encounter (Signed)
Biopsy results given to Amy to STAT scan in pt's chart  ?

## 2021-09-22 DIAGNOSIS — N6314 Unspecified lump in the right breast, lower inner quadrant: Secondary | ICD-10-CM | POA: Diagnosis not present

## 2021-09-22 DIAGNOSIS — N6489 Other specified disorders of breast: Secondary | ICD-10-CM | POA: Diagnosis not present

## 2021-09-28 DIAGNOSIS — C50919 Malignant neoplasm of unspecified site of unspecified female breast: Secondary | ICD-10-CM | POA: Diagnosis not present

## 2021-09-28 DIAGNOSIS — C50911 Malignant neoplasm of unspecified site of right female breast: Secondary | ICD-10-CM | POA: Diagnosis not present

## 2021-09-28 DIAGNOSIS — Z171 Estrogen receptor negative status [ER-]: Secondary | ICD-10-CM | POA: Diagnosis not present

## 2021-09-28 DIAGNOSIS — C50311 Malignant neoplasm of lower-inner quadrant of right female breast: Secondary | ICD-10-CM | POA: Diagnosis not present

## 2021-10-04 DIAGNOSIS — C50311 Malignant neoplasm of lower-inner quadrant of right female breast: Secondary | ICD-10-CM | POA: Diagnosis not present

## 2021-10-04 DIAGNOSIS — Z171 Estrogen receptor negative status [ER-]: Secondary | ICD-10-CM | POA: Diagnosis not present

## 2021-10-11 DIAGNOSIS — N6314 Unspecified lump in the right breast, lower inner quadrant: Secondary | ICD-10-CM | POA: Diagnosis not present

## 2021-10-11 DIAGNOSIS — C50311 Malignant neoplasm of lower-inner quadrant of right female breast: Secondary | ICD-10-CM | POA: Diagnosis not present

## 2021-10-11 DIAGNOSIS — C50911 Malignant neoplasm of unspecified site of right female breast: Secondary | ICD-10-CM | POA: Diagnosis not present

## 2021-10-11 DIAGNOSIS — Z171 Estrogen receptor negative status [ER-]: Secondary | ICD-10-CM | POA: Diagnosis not present

## 2021-10-12 DIAGNOSIS — Z171 Estrogen receptor negative status [ER-]: Secondary | ICD-10-CM | POA: Diagnosis not present

## 2021-10-12 DIAGNOSIS — C50311 Malignant neoplasm of lower-inner quadrant of right female breast: Secondary | ICD-10-CM | POA: Diagnosis not present

## 2021-10-30 DIAGNOSIS — Z9889 Other specified postprocedural states: Secondary | ICD-10-CM | POA: Diagnosis not present

## 2021-10-30 DIAGNOSIS — C50311 Malignant neoplasm of lower-inner quadrant of right female breast: Secondary | ICD-10-CM | POA: Diagnosis not present

## 2021-10-30 DIAGNOSIS — Z171 Estrogen receptor negative status [ER-]: Secondary | ICD-10-CM | POA: Diagnosis not present

## 2021-11-16 DIAGNOSIS — Z171 Estrogen receptor negative status [ER-]: Secondary | ICD-10-CM | POA: Diagnosis not present

## 2021-11-16 DIAGNOSIS — C50311 Malignant neoplasm of lower-inner quadrant of right female breast: Secondary | ICD-10-CM | POA: Diagnosis not present

## 2021-11-30 DIAGNOSIS — Z5111 Encounter for antineoplastic chemotherapy: Secondary | ICD-10-CM | POA: Diagnosis not present

## 2021-11-30 DIAGNOSIS — Z79899 Other long term (current) drug therapy: Secondary | ICD-10-CM | POA: Diagnosis not present

## 2021-11-30 DIAGNOSIS — F419 Anxiety disorder, unspecified: Secondary | ICD-10-CM | POA: Diagnosis not present

## 2021-11-30 DIAGNOSIS — Z171 Estrogen receptor negative status [ER-]: Secondary | ICD-10-CM | POA: Diagnosis not present

## 2021-11-30 DIAGNOSIS — C50311 Malignant neoplasm of lower-inner quadrant of right female breast: Secondary | ICD-10-CM | POA: Diagnosis not present

## 2021-11-30 DIAGNOSIS — Z1379 Encounter for other screening for genetic and chromosomal anomalies: Secondary | ICD-10-CM | POA: Diagnosis not present

## 2021-12-01 DIAGNOSIS — C50311 Malignant neoplasm of lower-inner quadrant of right female breast: Secondary | ICD-10-CM | POA: Diagnosis not present

## 2021-12-01 DIAGNOSIS — Z171 Estrogen receptor negative status [ER-]: Secondary | ICD-10-CM | POA: Diagnosis not present

## 2021-12-15 ENCOUNTER — Ambulatory Visit (INDEPENDENT_AMBULATORY_CARE_PROVIDER_SITE_OTHER): Payer: Medicare Other

## 2021-12-15 VITALS — Ht 62.5 in | Wt 180.0 lb

## 2021-12-15 DIAGNOSIS — Z0000 Encounter for general adult medical examination without abnormal findings: Secondary | ICD-10-CM | POA: Diagnosis not present

## 2021-12-15 NOTE — Patient Instructions (Signed)
Amy Mills , Thank you for taking time to come for your Medicare Wellness Visit. I appreciate your ongoing commitment to your health goals. Please review the following plan we discussed and let me know if I can assist you in the future.   Screening recommendations/referrals: Colonoscopy: decline at this time Mammogram: completed 08/08/2021 Bone Density: completed 10/17/2017 Recommended yearly ophthalmology/optometry visit for glaucoma screening and checkup Recommended yearly dental visit for hygiene and checkup  Vaccinations: Influenza vaccine: decline Pneumococcal vaccine: due Tdap vaccine: due Shingles vaccine: discussed   Covid-19: 06/22/2019, 06/01/2019  Advanced directives: Advance directive discussed with you today.   Conditions/risks identified: none  Next appointment: Follow up in one year for your annual wellness visit    Preventive Care 65 Years and Older, Female Preventive care refers to lifestyle choices and visits with your health care provider that can promote health and wellness. What does preventive care include? A yearly physical exam. This is also called an annual well check. Dental exams once or twice a year. Routine eye exams. Ask your health care provider how often you should have your eyes checked. Personal lifestyle choices, including: Daily care of your teeth and gums. Regular physical activity. Eating a healthy diet. Avoiding tobacco and drug use. Limiting alcohol use. Practicing safe sex. Taking low-dose aspirin every day. Taking vitamin and mineral supplements as recommended by your health care provider. What happens during an annual well check? The services and screenings done by your health care provider during your annual well check will depend on your age, overall health, lifestyle risk factors, and family history of disease. Counseling  Your health care provider may ask you questions about your: Alcohol use. Tobacco use. Drug use. Emotional  well-being. Home and relationship well-being. Sexual activity. Eating habits. History of falls. Memory and ability to understand (cognition). Work and work Astronomer. Reproductive health. Screening  You may have the following tests or measurements: Height, weight, and BMI. Blood pressure. Lipid and cholesterol levels. These may be checked every 5 years, or more frequently if you are over 1 years old. Skin check. Lung cancer screening. You may have this screening every year starting at age 85 if you have a 30-pack-year history of smoking and currently smoke or have quit within the past 15 years. Fecal occult blood test (FOBT) of the stool. You may have this test every year starting at age 37. Flexible sigmoidoscopy or colonoscopy. You may have a sigmoidoscopy every 5 years or a colonoscopy every 10 years starting at age 30. Hepatitis C blood test. Hepatitis B blood test. Sexually transmitted disease (STD) testing. Diabetes screening. This is done by checking your blood sugar (glucose) after you have not eaten for a while (fasting). You may have this done every 1-3 years. Bone density scan. This is done to screen for osteoporosis. You may have this done starting at age 33. Mammogram. This may be done every 1-2 years. Talk to your health care provider about how often you should have regular mammograms. Talk with your health care provider about your test results, treatment options, and if necessary, the need for more tests. Vaccines  Your health care provider may recommend certain vaccines, such as: Influenza vaccine. This is recommended every year. Tetanus, diphtheria, and acellular pertussis (Tdap, Td) vaccine. You may need a Td booster every 10 years. Zoster vaccine. You may need this after age 70. Pneumococcal 13-valent conjugate (PCV13) vaccine. One dose is recommended after age 60. Pneumococcal polysaccharide (PPSV23) vaccine. One dose is recommended after age  64. Talk to your  health care provider about which screenings and vaccines you need and how often you need them. This information is not intended to replace advice given to you by your health care provider. Make sure you discuss any questions you have with your health care provider. Document Released: 05/20/2015 Document Revised: 01/11/2016 Document Reviewed: 02/22/2015 Elsevier Interactive Patient Education  2017 [place] Prevention in the Home Falls can cause injuries. They can happen to people of all ages. There are many things you can do to make your home safe and to help prevent falls. What can I do on the outside of my home? Regularly fix the edges of walkways and driveways and fix any cracks. Remove anything that might make you trip as you walk through a door, such as a raised step or threshold. Trim any bushes or trees on the path to your home. Use bright outdoor lighting. Clear any walking paths of anything that might make someone trip, such as rocks or tools. Regularly check to see if handrails are loose or broken. Make sure that both sides of any steps have handrails. Any raised decks and porches should have guardrails on the edges. Have any leaves, snow, or ice cleared regularly. Use sand or salt on walking paths during winter. Clean up any spills in your garage right away. This includes oil or grease spills. What can I do in the bathroom? Use night lights. Install grab bars by the toilet and in the tub and shower. Do not use towel bars as grab bars. Use non-skid mats or decals in the tub or shower. If you need to sit down in the shower, use a plastic, non-slip stool. Keep the floor dry. Clean up any water that spills on the floor as soon as it happens. Remove soap buildup in the tub or shower regularly. Attach bath mats securely with double-sided non-slip rug tape. Do not have throw rugs and other things on the floor that can make you trip. What can I do in the bedroom? Use night  lights. Make sure that you have a light by your bed that is easy to reach. Do not use any sheets or blankets that are too big for your bed. They should not hang down onto the floor. Have a firm chair that has side arms. You can use this for support while you get dressed. Do not have throw rugs and other things on the floor that can make you trip. What can I do in the kitchen? Clean up any spills right away. Avoid walking on wet floors. Keep items that you use a lot in easy-to-reach places. If you need to reach something above you, use a strong step stool that has a grab bar. Keep electrical cords out of the way. Do not use floor polish or wax that makes floors slippery. If you must use wax, use non-skid floor wax. Do not have throw rugs and other things on the floor that can make you trip. What can I do with my stairs? Do not leave any items on the stairs. Make sure that there are handrails on both sides of the stairs and use them. Fix handrails that are broken or loose. Make sure that handrails are as long as the stairways. Check any carpeting to make sure that it is firmly attached to the stairs. Fix any carpet that is loose or worn. Avoid having throw rugs at the top or bottom of the stairs. If you do have  throw rugs, attach them to the floor with carpet tape. Make sure that you have a light switch at the top of the stairs and the bottom of the stairs. If you do not have them, ask someone to add them for you. What else can I do to help prevent falls? Wear shoes that: Do not have high heels. Have rubber bottoms. Are comfortable and fit you well. Are closed at the toe. Do not wear sandals. If you use a stepladder: Make sure that it is fully opened. Do not climb a closed stepladder. Make sure that both sides of the stepladder are locked into place. Ask someone to hold it for you, if possible. Clearly mark and make sure that you can see: Any grab bars or handrails. First and last  steps. Where the edge of each step is. Use tools that help you move around (mobility aids) if they are needed. These include: Canes. Walkers. Scooters. Crutches. Turn on the lights when you go into a dark area. Replace any light bulbs as soon as they burn out. Set up your furniture so you have a clear path. Avoid moving your furniture around. If any of your floors are uneven, fix them. If there are any pets around you, be aware of where they are. Review your medicines with your doctor. Some medicines can make you feel dizzy. This can increase your chance of falling. Ask your doctor what other things that you can do to help prevent falls. This information is not intended to replace advice given to you by your health care provider. Make sure you discuss any questions you have with your health care provider. Document Released: 02/17/2009 Document Revised: 09/29/2015 Document Reviewed: 05/28/2014 Elsevier Interactive Patient Education  2017 [employer].

## 2021-12-15 NOTE — Progress Notes (Signed)
I connected with Amy Mills today by telephone and verified that I am speaking with the correct person using two identifiers. Location patient: home Location provider: work Persons participating in the virtual visit: Xylina, Rhoads LPN.   I discussed the limitations, risks, security and privacy concerns of performing an evaluation and management service by telephone and the availability of in person appointments. I also discussed with the patient that there may be a patient responsible charge related to this service. The patient expressed understanding and verbally consented to this telephonic visit.    Interactive audio and video telecommunications were attempted between this provider and patient, however failed, due to patient having technical difficulties OR patient did not have access to video capability.  We continued and completed visit with audio only.     Vital signs may be patient reported or missing.  Subjective:   Amy Mills is a 71 y.o. female who presents for Medicare Annual (Subsequent) preventive examination.  Review of Systems     Cardiac Risk Factors include: advanced age (>39men, >29 women);hypertension;obesity (BMI >30kg/m2)     Objective:    Today's Vitals   12/15/21 0841  Weight: 180 lb (81.6 kg)  Height: 5' 2.5" (1.588 m)   Body mass index is 32.4 kg/m.     12/15/2021    8:46 AM 01/28/2021   11:34 PM  Advanced Directives  Does Patient Have a Medical Advance Directive? No No  Would patient like information on creating a medical advance directive?  No - Patient declined    Current Medications (verified) Outpatient Encounter Medications as of 12/15/2021  Medication Sig   atenolol (TENORMIN) 25 MG tablet Take 1 tablet (25 mg total) by mouth daily.   Diclofenac Sodium CR 100 MG 24 hr tablet TAKE 1 TABLET BY MOUTH ONCE DAILY   Multiple Vitamins-Minerals (WOMENS MULTI PO) Take 1 tablet by mouth daily.     ondansetron (ZOFRAN ODT) 4 MG  disintegrating tablet Take 1 tablet (4 mg total) by mouth every 8 (eight) hours as needed for nausea or vomiting.   rosuvastatin (CRESTOR) 5 MG tablet TAKE 1 TABLET BY MOUTH ONCE DAILY   SUMAtriptan (IMITREX) 50 MG tablet TAKE 1 TABLET EVERY 2 HOURS AS NEEDED FOR MIGRAINE--MAX 2 DOSES PER DAY   trimethoprim (TRIMPEX) 100 MG tablet Take 1 tablet (100 mg total) by mouth daily.   chlorpheniramine-HYDROcodone (TUSSIONEX PENNKINETIC ER) 10-8 MG/5ML SUER Take 5 mLs by mouth every 12 (twelve) hours as needed for cough. (Patient not taking: Reported on 12/15/2021)   Cholecalciferol (D3-1000 PO) Take 1 tablet by mouth daily. (Patient not taking: Reported on 12/15/2021)   diclofenac Sodium (VOLTAREN) 1 % GEL Apply 2 g topically 4 (four) times daily. (Patient not taking: Reported on 12/15/2021)   HYDROcodone-acetaminophen (NORCO/VICODIN) 5-325 MG tablet Take 1-2 tablets by mouth every 6 (six) hours as needed for moderate pain. (Patient not taking: Reported on 12/15/2021)   No facility-administered encounter medications on file as of 12/15/2021.    Allergies (verified) Amoxicillin-pot clavulanate, Doxycycline, and Nitrofurantoin   History: Past Medical History:  Diagnosis Date   Anxiety reaction    Back pain    Fibromyalgia    History of recurrent UTIs    HLD (hyperlipidemia)    Hypertension    Past Surgical History:  Procedure Laterality Date   ABDOMINAL HYSTERECTOMY     OVARIAN CYST REMOVAL     TONSILLECTOMY     Family History  Problem Relation Age of Onset   Thyroid  nodules Mother    Heart disease Mother    Hypertension Father    Dementia Father    Alcohol abuse Brother    Hyperlipidemia Daughter    Hypertension Daughter    Cancer Maternal Aunt        breast   Breast cancer Maternal Aunt    Cancer Paternal Aunt        breast   Breast cancer Paternal Aunt    Social History   Socioeconomic History   Marital status: Married    Spouse name: Not on file   Number of children: Not on  file   Years of education: Not on file   Highest education level: Not on file  Occupational History   Not on file  Tobacco Use   Smoking status: Never   Smokeless tobacco: Never  Vaping Use   Vaping Use: Never used  Substance and Sexual Activity   Alcohol use: Yes    Alcohol/week: 0.0 standard drinks of alcohol    Comment: rare   Drug use: No   Sexual activity: Not on file  Other Topics Concern   Not on file  Social History Narrative   Not on file   Social Determinants of Health   Financial Resource Strain: Low Risk  (12/15/2021)   Overall Financial Resource Strain (CARDIA)    Difficulty of Paying Living Expenses: Not hard at all  Food Insecurity: No Food Insecurity (12/15/2021)   Hunger Vital Sign    Worried About Running Out of Food in the Last Year: Never true    Ran Out of Food in the Last Year: Never true  Transportation Needs: No Transportation Needs (12/15/2021)   PRAPARE - Administrator, Civil Service (Medical): No    Lack of Transportation (Non-Medical): No  Physical Activity: Inactive (12/15/2021)   Exercise Vital Sign    Days of Exercise per Week: 0 days    Minutes of Exercise per Session: 0 min  Stress: No Stress Concern Present (12/15/2021)   [employer] of Occupational Health - Occupational Stress Questionnaire    Feeling of Stress : Only a little  Social Connections: Not on file    Tobacco Counseling Counseling given: Not Answered   Clinical Intake:  Pre-visit preparation completed: Yes  Pain : No/denies pain     Nutritional Status: BMI > 30  Obese Nutritional Risks: None Diabetes: No  How often do you need to have someone help you when you read instructions, pamphlets, or other written materials from your doctor or pharmacy?: 1 - Never What is the last grade level you completed in school?: 56yrs college  Diabetic? no  Interpreter Needed?: No  Information entered by :: NAllen LPN   Activities of Daily Living     12/15/2021    8:48 AM 12/11/2021    1:44 PM  In your present state of health, do you have any difficulty performing the following activities:  Hearing? 0 0  Vision? 0 0  Difficulty concentrating or making decisions? 0 0  Walking or climbing stairs? 0 0  Dressing or bathing? 0 0  Doing errands, shopping? 0 0  Preparing Food and eating ? N N  Using the Toilet? N N  In the past six months, have you accidently leaked urine? Y Y  Do you have problems with loss of bowel control? N N  Managing your Medications? N N  Managing your Finances? N N  Housekeeping or managing your Housekeeping? N N    Patient  Care Team: Tower, Audrie Gallus, MD as PCP - General  Indicate any recent Medical Services you may have received from other than [facility] providers in the past year (date may be approximate).     Assessment:   This is a routine wellness examination for Yaire.  Hearing/Vision screen Vision Screening - Comments:: Regular eye exams, [facility]  Dietary issues and exercise activities discussed: Current Exercise Habits: The patient has a physically strenuous job, but has no regular exercise apart from work.   Goals Addressed             This Visit's Progress    Patient Stated       12/15/2021, get through the chemo       Depression Screen    12/15/2021    8:47 AM 12/12/2020    9:30 AM 12/11/2019    9:43 AM 12/09/2018    3:47 PM 08/28/2017    3:39 PM 08/24/2016    3:39 PM  PHQ 2/9 Scores  PHQ - 2 Score 0 0 0 0 0 0  PHQ- 9 Score      3    Fall Risk    12/15/2021    8:47 AM 12/11/2021    1:44 PM 12/12/2020    9:30 AM 12/11/2019    9:43 AM 03/30/2019    6:46 PM  Fall Risk   Falls in the past year? 0 0 0 0 0  Comment     Emmi Telephone Survey: data to providers prior to load  Number falls in past yr: 0 0     Injury with Fall? 0 0     Risk for fall due to : Medication side effect      Follow up Falls evaluation completed;Education provided;Falls prevention discussed  Falls evaluation  completed      FALL RISK PREVENTION PERTAINING TO THE HOME:  Any stairs in or around the home? Yes  If so, are there any without handrails? No  Home free of loose throw rugs in walkways, pet beds, electrical cords, etc? Yes  Adequate lighting in your home to reduce risk of falls? Yes   ASSISTIVE DEVICES UTILIZED TO PREVENT FALLS:  Life alert? No  Use of a cane, walker or w/c? No  Grab bars in the bathroom? No  Shower chair or bench in shower? Yes  Elevated toilet seat or a handicapped toilet? Yes   TIMED UP AND GO:  Was the test performed? No .      Cognitive Function:        12/15/2021    8:48 AM  6CIT Screen  What Year? 0 points  What month? 0 points  What time? 0 points  Count back from 20 0 points  Months in reverse 0 points  Repeat phrase 2 points  Total Score 2 points    Immunizations Immunization History  Administered Date(s) Administered   PFIZER(Purple Top)SARS-COV-2 Vaccination 06/01/2019, 06/22/2019   Pneumococcal Conjugate-13 08/28/2017   Td 05/07/1997, 05/07/2005    TDAP status: Due, Education has been provided regarding the importance of this vaccine. Advised may receive this vaccine at local pharmacy or Health Dept. Aware to provide a copy of the vaccination record if obtained from local pharmacy or Health Dept. Verbalized acceptance and understanding.  Flu Vaccine status: Declined, Education has been provided regarding the importance of this vaccine but patient still declined. Advised may receive this vaccine at local pharmacy or Health Dept. Aware to provide a copy of the vaccination record if  obtained from local pharmacy or Health Dept. Verbalized acceptance and understanding.  Pneumococcal vaccine status: Declined,  Education has been provided regarding the importance of this vaccine but patient still declined. Advised may receive this vaccine at local pharmacy or Health Dept. Aware to provide a copy of the vaccination record if obtained from  local pharmacy or Health Dept. Verbalized acceptance and understanding.   Covid-19 vaccine status: Completed vaccines  Qualifies for Shingles Vaccine? Yes   Zostavax completed No   Shingrix Completed?: No.    Education has been provided regarding the importance of this vaccine. Patient has been advised to call insurance company to determine out of pocket expense if they have not yet received this vaccine. Advised may also receive vaccine at local pharmacy or Health Dept. Verbalized acceptance and understanding.  Screening Tests Health Maintenance  Topic Date Due   Pneumonia Vaccine 5365+ Years old (2 - PPSV23 or PCV20) 08/29/2018   COVID-19 Vaccine (3 - Pfizer risk series) 07/20/2019   COLONOSCOPY (Pts 45-5850yrs Insurance coverage will need to be confirmed)  05/15/2020   INFLUENZA VACCINE  12/12/2025 (Originally 12/05/2021)   Zoster Vaccines- Shingrix (1 of 2) 12/12/2025 (Originally 02/04/1970)   TETANUS/TDAP  12/08/2028 (Originally 05/08/2015)   MAMMOGRAM  08/09/2022   DEXA SCAN  Completed   Hepatitis C Screening  Completed   HPV VACCINES  Aged Out    Health Maintenance  Health Maintenance Due  Topic Date Due   Pneumonia Vaccine 8165+ Years old (2 - PPSV23 or PCV20) 08/29/2018   COVID-19 Vaccine (3 - Pfizer risk series) 07/20/2019   COLONOSCOPY (Pts 45-8550yrs Insurance coverage will need to be confirmed)  05/15/2020    Colorectal cancer screening: decline at this time  Mammogram status: Completed 08/08/2021. Repeat every year  Bone Density status: Completed 10/17/2017.   Lung Cancer Screening: (Low Dose CT Chest recommended if Age 28-80 years, 30 pack-year currently smoking OR have quit w/in 15years.) does not qualify.   Lung Cancer Screening Referral: no  Additional Screening:  Hepatitis C Screening: does qualify; Completed 12/01/2018  Vision Screening: Recommended annual ophthalmology exams for early detection of glaucoma and other disorders of the eye. Is the patient up to date  with their annual eye exam?  Yes  Who is the provider or what is the name of the office in which the patient attends annual [facility] exams[facility] If pt is not established with a provider, would they like to be referred to a provider to establish care? No .   Dental Screening: Recommended annual dental exams for proper oral hygiene  Community Resource Referral / Chronic Care Management: CRR required this visit?  No   CCM required this visit?  No      Plan:     I have personally reviewed and noted the following in the patient's chart:   Medical and social history Use of alcohol, tobacco or illicit drugs  Current medications and supplements including opioid prescriptions.  Functional ability and status Nutritional status Physical activity Advanced directives List of other physicians Hospitalizations, surgeries, and ER visits in previous 12 months Vitals Screenings to include cognitive, depression, and falls Referrals and appointments  In addition, I have reviewed and discussed with patient certain preventive protocols, quality metrics, and best practice recommendations. A written personalized care plan for preventive services as well as general preventive health recommendations were provided to patient.     Barb Merinoickeah E Kyonna Frier, LPN   1/61/09608/03/2022   Nurse Notes: none  Due to this  being a virtual visit, the after visit summary with patients personalized plan was offered to patient via mail or my-chart. Patient would like to access on my-chart

## 2021-12-21 DIAGNOSIS — R197 Diarrhea, unspecified: Secondary | ICD-10-CM | POA: Diagnosis not present

## 2021-12-21 DIAGNOSIS — Z79630 Long term (current) use of alkylating agent: Secondary | ICD-10-CM | POA: Diagnosis not present

## 2021-12-21 DIAGNOSIS — Z5111 Encounter for antineoplastic chemotherapy: Secondary | ICD-10-CM | POA: Diagnosis not present

## 2021-12-21 DIAGNOSIS — C50311 Malignant neoplasm of lower-inner quadrant of right female breast: Secondary | ICD-10-CM | POA: Diagnosis not present

## 2021-12-21 DIAGNOSIS — Z171 Estrogen receptor negative status [ER-]: Secondary | ICD-10-CM | POA: Diagnosis not present

## 2021-12-21 DIAGNOSIS — Z1379 Encounter for other screening for genetic and chromosomal anomalies: Secondary | ICD-10-CM | POA: Diagnosis not present

## 2021-12-21 DIAGNOSIS — R944 Abnormal results of kidney function studies: Secondary | ICD-10-CM | POA: Diagnosis not present

## 2021-12-21 DIAGNOSIS — F419 Anxiety disorder, unspecified: Secondary | ICD-10-CM | POA: Diagnosis not present

## 2021-12-21 DIAGNOSIS — Z79899 Other long term (current) drug therapy: Secondary | ICD-10-CM | POA: Diagnosis not present

## 2021-12-21 DIAGNOSIS — Z7969 Long term (current) use of other immunomodulators and immunosuppressants: Secondary | ICD-10-CM | POA: Diagnosis not present

## 2021-12-23 DIAGNOSIS — C50311 Malignant neoplasm of lower-inner quadrant of right female breast: Secondary | ICD-10-CM | POA: Diagnosis not present

## 2021-12-23 DIAGNOSIS — Z171 Estrogen receptor negative status [ER-]: Secondary | ICD-10-CM | POA: Diagnosis not present

## 2022-01-11 DIAGNOSIS — Z87440 Personal history of urinary (tract) infections: Secondary | ICD-10-CM | POA: Diagnosis not present

## 2022-01-11 DIAGNOSIS — R82998 Other abnormal findings in urine: Secondary | ICD-10-CM | POA: Diagnosis not present

## 2022-01-11 DIAGNOSIS — Z171 Estrogen receptor negative status [ER-]: Secondary | ICD-10-CM | POA: Diagnosis not present

## 2022-01-11 DIAGNOSIS — R350 Frequency of micturition: Secondary | ICD-10-CM | POA: Diagnosis not present

## 2022-01-11 DIAGNOSIS — Z1379 Encounter for other screening for genetic and chromosomal anomalies: Secondary | ICD-10-CM | POA: Diagnosis not present

## 2022-01-11 DIAGNOSIS — F064 Anxiety disorder due to known physiological condition: Secondary | ICD-10-CM | POA: Diagnosis not present

## 2022-01-11 DIAGNOSIS — Z9011 Acquired absence of right breast and nipple: Secondary | ICD-10-CM | POA: Diagnosis not present

## 2022-01-11 DIAGNOSIS — Z5111 Encounter for antineoplastic chemotherapy: Secondary | ICD-10-CM | POA: Diagnosis not present

## 2022-01-11 DIAGNOSIS — Z803 Family history of malignant neoplasm of breast: Secondary | ICD-10-CM | POA: Diagnosis not present

## 2022-01-11 DIAGNOSIS — C50311 Malignant neoplasm of lower-inner quadrant of right female breast: Secondary | ICD-10-CM | POA: Diagnosis not present

## 2022-01-11 DIAGNOSIS — Z800 Family history of malignant neoplasm of digestive organs: Secondary | ICD-10-CM | POA: Diagnosis not present

## 2022-01-11 DIAGNOSIS — R432 Parageusia: Secondary | ICD-10-CM | POA: Diagnosis not present

## 2022-01-11 DIAGNOSIS — M898X9 Other specified disorders of bone, unspecified site: Secondary | ICD-10-CM | POA: Diagnosis not present

## 2022-01-11 DIAGNOSIS — R197 Diarrhea, unspecified: Secondary | ICD-10-CM | POA: Diagnosis not present

## 2022-01-11 DIAGNOSIS — Z79899 Other long term (current) drug therapy: Secondary | ICD-10-CM | POA: Diagnosis not present

## 2022-01-12 ENCOUNTER — Other Ambulatory Visit: Payer: Self-pay | Admitting: Family Medicine

## 2022-01-12 DIAGNOSIS — C50311 Malignant neoplasm of lower-inner quadrant of right female breast: Secondary | ICD-10-CM | POA: Diagnosis not present

## 2022-01-12 DIAGNOSIS — Z171 Estrogen receptor negative status [ER-]: Secondary | ICD-10-CM | POA: Diagnosis not present

## 2022-01-12 NOTE — Telephone Encounter (Signed)
All meds are due. Pt has CPE scheduled on 02/19/22  Diclofenac last filled on 07/14/21 #90 tabs/ 1 refill  Trimpex last filled on 12/21/20 #90 tab/ 3 refills

## 2022-01-12 NOTE — Telephone Encounter (Signed)
Please refill everything to get by until PE Thanks

## 2022-02-01 DIAGNOSIS — Z5111 Encounter for antineoplastic chemotherapy: Secondary | ICD-10-CM | POA: Diagnosis not present

## 2022-02-01 DIAGNOSIS — Z171 Estrogen receptor negative status [ER-]: Secondary | ICD-10-CM | POA: Diagnosis not present

## 2022-02-01 DIAGNOSIS — C50311 Malignant neoplasm of lower-inner quadrant of right female breast: Secondary | ICD-10-CM | POA: Diagnosis not present

## 2022-02-01 DIAGNOSIS — Z79899 Other long term (current) drug therapy: Secondary | ICD-10-CM | POA: Diagnosis not present

## 2022-02-02 DIAGNOSIS — C50311 Malignant neoplasm of lower-inner quadrant of right female breast: Secondary | ICD-10-CM | POA: Diagnosis not present

## 2022-02-02 DIAGNOSIS — Z171 Estrogen receptor negative status [ER-]: Secondary | ICD-10-CM | POA: Diagnosis not present

## 2022-02-11 ENCOUNTER — Telehealth: Payer: Self-pay | Admitting: Family Medicine

## 2022-02-11 DIAGNOSIS — E7800 Pure hypercholesterolemia, unspecified: Secondary | ICD-10-CM

## 2022-02-11 DIAGNOSIS — R7303 Prediabetes: Secondary | ICD-10-CM

## 2022-02-11 DIAGNOSIS — I10 Essential (primary) hypertension: Secondary | ICD-10-CM

## 2022-02-11 NOTE — Telephone Encounter (Signed)
-----   Message from Ellamae Sia sent at 01/31/2022  4:16 PM EDT ----- Regarding: Lab orders for Monday, 10.9.23 Patient is scheduled for CPX labs, please order future labs, Thanks , Karna Christmas

## 2022-02-12 ENCOUNTER — Other Ambulatory Visit (INDEPENDENT_AMBULATORY_CARE_PROVIDER_SITE_OTHER): Payer: Medicare Other

## 2022-02-12 ENCOUNTER — Other Ambulatory Visit: Payer: Medicare Other

## 2022-02-12 ENCOUNTER — Telehealth: Payer: Self-pay | Admitting: *Deleted

## 2022-02-12 DIAGNOSIS — I10 Essential (primary) hypertension: Secondary | ICD-10-CM | POA: Diagnosis not present

## 2022-02-12 DIAGNOSIS — R7303 Prediabetes: Secondary | ICD-10-CM

## 2022-02-12 DIAGNOSIS — E7800 Pure hypercholesterolemia, unspecified: Secondary | ICD-10-CM

## 2022-02-12 LAB — COMPREHENSIVE METABOLIC PANEL
ALT: 19 U/L (ref 0–35)
AST: 17 U/L (ref 0–37)
Albumin: 3.7 g/dL (ref 3.5–5.2)
Alkaline Phosphatase: 110 U/L (ref 39–117)
BUN: 16 mg/dL (ref 6–23)
CO2: 23 mEq/L (ref 19–32)
Calcium: 9 mg/dL (ref 8.4–10.5)
Chloride: 108 mEq/L (ref 96–112)
Creatinine, Ser: 1.05 mg/dL (ref 0.40–1.20)
GFR: 53.64 mL/min — ABNORMAL LOW (ref >60.00)
Glucose, Bld: 87 mg/dL (ref 70–99)
Potassium: 3.9 mEq/L (ref 3.5–5.1)
Sodium: 143 mEq/L (ref 135–145)
Total Bilirubin: 0.4 mg/dL (ref 0.2–1.2)
Total Protein: 5.5 g/dL — ABNORMAL LOW (ref 6.0–8.3)

## 2022-02-12 LAB — CBC WITH DIFFERENTIAL/PLATELET
Basophils Absolute: 0.1 10*3/uL (ref 0.0–0.1)
Basophils Relative: 0.5 % (ref 0.0–3.0)
Eosinophils Absolute: 0.1 10*3/uL (ref 0.0–0.7)
Eosinophils Relative: 0.4 % (ref 0.0–5.0)
HCT: 28.5 % — ABNORMAL LOW (ref 36.0–46.0)
Hemoglobin: 9.4 g/dL — ABNORMAL LOW (ref 12.0–15.0)
Lymphocytes Relative: 4.3 % — ABNORMAL LOW (ref 12.0–46.0)
Lymphs Abs: 0.9 10*3/uL (ref 0.7–4.0)
MCHC: 32.8 g/dL (ref 30.0–36.0)
MCV: 94.8 fl (ref 78.0–100.0)
Monocytes Absolute: 1 10*3/uL (ref 0.1–1.0)
Monocytes Relative: 4.8 % (ref 3.0–12.0)
Neutro Abs: 18.5 10*3/uL — ABNORMAL HIGH (ref 1.4–7.7)
Neutrophils Relative %: 90 % — ABNORMAL HIGH (ref 43.0–77.0)
Platelets: 211 10*3/uL (ref 150.0–400.0)
RBC: 3.01 Mil/uL — ABNORMAL LOW (ref 3.87–5.11)
RDW: 18.1 % — ABNORMAL HIGH (ref 11.5–15.5)
WBC: 20.6 10*3/uL (ref 4.0–10.5)

## 2022-02-12 LAB — LIPID PANEL
Cholesterol: 107 mg/dL (ref 0–200)
HDL: 21.4 mg/dL — ABNORMAL LOW (ref >39.00)
LDL Cholesterol: 54 mg/dL (ref 0–99)
NonHDL: 85.5
Total CHOL/HDL Ratio: 5
Triglycerides: 156 mg/dL — ABNORMAL HIGH (ref 0.0–149.0)
VLDL: 31.2 mg/dL (ref 0.0–40.0)

## 2022-02-12 LAB — HEMOGLOBIN A1C: Hgb A1c MFr Bld: 6.5 % (ref 4.6–6.5)

## 2022-02-12 LAB — TSH: TSH: 1.66 u[IU]/mL (ref 0.35–5.50)

## 2022-02-12 NOTE — Telephone Encounter (Signed)
Labs sent to Edwin Cap, NP

## 2022-02-12 NOTE — Telephone Encounter (Signed)
It looks like this has been elevated in the setting of her cancer treatment  Please send copy of labs when they return to Dr Edwin Cap (her onc at [facility]) Thanks

## 2022-02-12 NOTE — Telephone Encounter (Signed)
Santiago Glad called from [place] lab with critical  WBC is 20.6, results given to PCP

## 2022-02-19 ENCOUNTER — Encounter: Payer: Self-pay | Admitting: Family Medicine

## 2022-02-19 ENCOUNTER — Ambulatory Visit (INDEPENDENT_AMBULATORY_CARE_PROVIDER_SITE_OTHER): Payer: Medicare Other | Admitting: Family Medicine

## 2022-02-19 VITALS — BP 126/62 | HR 95 | Temp 97.8°F | Ht 62.25 in | Wt 179.2 lb

## 2022-02-19 DIAGNOSIS — Z853 Personal history of malignant neoplasm of breast: Secondary | ICD-10-CM

## 2022-02-19 DIAGNOSIS — Z1211 Encounter for screening for malignant neoplasm of colon: Secondary | ICD-10-CM

## 2022-02-19 DIAGNOSIS — I10 Essential (primary) hypertension: Secondary | ICD-10-CM

## 2022-02-19 DIAGNOSIS — N390 Urinary tract infection, site not specified: Secondary | ICD-10-CM | POA: Diagnosis not present

## 2022-02-19 DIAGNOSIS — Z0000 Encounter for general adult medical examination without abnormal findings: Secondary | ICD-10-CM | POA: Diagnosis not present

## 2022-02-19 DIAGNOSIS — E7800 Pure hypercholesterolemia, unspecified: Secondary | ICD-10-CM | POA: Diagnosis not present

## 2022-02-19 DIAGNOSIS — E6609 Other obesity due to excess calories: Secondary | ICD-10-CM

## 2022-02-19 DIAGNOSIS — R7303 Prediabetes: Secondary | ICD-10-CM

## 2022-02-19 DIAGNOSIS — Z6832 Body mass index (BMI) 32.0-32.9, adult: Secondary | ICD-10-CM

## 2022-02-19 DIAGNOSIS — R350 Frequency of micturition: Secondary | ICD-10-CM | POA: Insufficient documentation

## 2022-02-19 LAB — POC URINALSYSI DIPSTICK (AUTOMATED)
Bilirubin, UA: 1
Blood, UA: NEGATIVE
Glucose, UA: NEGATIVE
Ketones, UA: NEGATIVE
Nitrite, UA: POSITIVE
Protein, UA: POSITIVE — AB
Spec Grav, UA: >=1.03 — AB (ref 1.010–1.025)
Urobilinogen, UA: 0.2 E.U./dL
pH, UA: 6 (ref 5.0–8.0)

## 2022-02-19 MED ORDER — ROSUVASTATIN CALCIUM 5 MG PO TABS: ORAL_TABLET | ORAL | 3 refills | Status: DC

## 2022-02-19 MED ORDER — TRIMETHOPRIM 100 MG PO TABS: 100.0000 mg | ORAL_TABLET | Freq: Every day | ORAL | 3 refills | Status: DC

## 2022-02-19 MED ORDER — CIPROFLOXACIN HCL 250 MG PO TABS: 250.0000 mg | ORAL_TABLET | Freq: Two times a day (BID) | ORAL | 0 refills | Status: DC

## 2022-02-19 MED ORDER — SUMATRIPTAN SUCCINATE 50 MG PO TABS: ORAL_TABLET | ORAL | 11 refills | Status: DC

## 2022-02-19 MED ORDER — ATENOLOL 25 MG PO TABS: 25.0000 mg | ORAL_TABLET | Freq: Every day | ORAL | 3 refills | Status: DC

## 2022-02-19 NOTE — Assessment & Plan Note (Signed)
Symptoms from recent uti returned after tx with cipro  Pos ua today  cipro sent for 3 d pending culture  On chemo/ immune changes currently  Enc fluids  ER precautions noted  cx pending

## 2022-02-19 NOTE — Assessment & Plan Note (Signed)
In the middle of breast cancer treatment  Anxious to get back to normal diet and exercise soon

## 2022-02-19 NOTE — Assessment & Plan Note (Signed)
bp in fair control at this time  BP Readings from Last 1 Encounters:  02/19/22 126/62   No changes needed Most recent labs reviewed  Disc lifstyle change with low sodium diet and exercise  Plan to continue tenormin 25 mg daily

## 2022-02-19 NOTE — Assessment & Plan Note (Signed)
Lab Results  Component Value Date   HGBA1C 6.5 02/12/2022   Planning to get back to normal lifestyle and exercise after finishing cancer tx  Do expect improvement

## 2022-02-19 NOTE — Assessment & Plan Note (Signed)
Pos ua  Recurrent  In breast ca tx  cipro 3 d sent in pending cx

## 2022-02-19 NOTE — Progress Notes (Signed)
Subjective:    Patient ID: Amy Mills, female    DOB: 06-16-1950, 71 y.o.   MRN: [id number]  HPI Here for health maintenance exam and to review chronic medical problems    Wt Readings from Last 3 Encounters:  02/19/22 179 lb 4 oz (81.3 kg)  12/15/21 180 lb (81.6 kg)  01/28/21 175 lb (79.4 kg)   32.52 kg/m  Breast cancer - chemo is done It was tough  Has planned rad tx most likely  Not a lot of time off for the most part   Mom got sick in the midst of it  She is still living at 3 with CHF    Immunization History  Administered Date(s) Administered   PFIZER(Purple Top)SARS-COV-2 Vaccination 06/01/2019, 06/22/2019   Pneumococcal Conjugate-13 08/28/2017   Td 05/07/1997, 05/07/2005   Health Maintenance Due  Topic Date Due   Pneumonia Vaccine 7+ Years old (2 - PPSV23 or PCV20) 08/29/2018   COVID-19 Vaccine (3 - Pfizer risk series) 07/20/2019   COLONOSCOPY (Pts 45-29yrs Insurance coverage will need to be confirmed)  05/15/2020   Had prevnar 13 vaccine in 08/2017  Cannot get a vaccine for 2 more months   Flu shot - will have later   Colonoscopy 05/2010 - is due for 10 y recall  Had it scheduled and then had to have knee surgery and then breast cancer   Mammogram 08/2021 Personal h/o breast cancer  Self breast exam: no changes   Dexa  10/2017 -in the normal range  Falls: none Fractures: none  Supplements -holding for now  Exercise: looking forward to getting back to exercise when treatment is over    HTN bp is stable today  No cp or palpitations or headaches or edema  No side effects to medicines  BP Readings from Last 3 Encounters:  02/19/22 126/62  01/29/21 (!) 155/89  12/12/20 136/80   Pulse Readings from Last 3 Encounters:  02/19/22 95  01/29/21 88  12/12/20 63    Tenormin 25 mg daily   Hyperlipidemia Lab Results  Component Value Date   CHOL 107 02/12/2022   CHOL 182 12/05/2020   CHOL 156 12/04/2019   Lab Results  Component Value Date   HDL  21.40 (L) 02/12/2022   HDL 48.80 12/05/2020   HDL 43.50 12/04/2019   Lab Results  Component Value Date   LDLCALC 54 02/12/2022   LDLCALC 98 12/05/2020   LDLCALC 82 12/04/2019   Lab Results  Component Value Date   TRIG 156.0 (H) 02/12/2022   TRIG 177.0 (H) 12/05/2020   TRIG 149.0 12/04/2019   Lab Results  Component Value Date   CHOLHDL 5 02/12/2022   CHOLHDL 4 12/05/2020   CHOLHDL 4 12/04/2019   Lab Results  Component Value Date   LDLDIRECT 212.8 07/02/2006  Takes crestor 5 mg daily  Less exercise- HDL is down      Prediabetes  Lab Results  Component Value Date   HGBA1C 6.5 02/12/2022   This is up from 6.0 Makes effort to eat less sugar Taste is off still from cancer treatment   Has had several utis  Trimethoprim every day  Was on cipro for last 2 utis (sept 7)  e coli  Frequency  Odor in urine  Fluid intake -lots of water   Results for orders placed or performed in visit on 02/19/22  POCT Urinalysis Dipstick (Automated)  Result Value Ref Range   Color, UA Amber    Clarity, UA Cloudy  Glucose, UA Negative Negative   Bilirubin, UA 1 mg/dL    Ketones, UA Negative    Spec Grav, UA >=1.030 (A) 1.010 - 1.025   Blood, UA Negative    pH, UA 6.0 5.0 - 8.0   Protein, UA Positive (A) Negative   Urobilinogen, UA 0.2 0.2 or 1.0 E.U./dL   Nitrite, UA Positive    Leukocytes, UA Moderate (2+) (A) Negative      Lab Results  Component Value Date   WBC 20.6 Repeated and verified X2. (HH) 02/12/2022   HGB 9.4 (L) 02/12/2022   HCT 28.5 (L) 02/12/2022   MCV 94.8 02/12/2022   PLT 211.0 02/12/2022  In setting of cancer tx   Lab Results  Component Value Date   CREATININE 1.05 02/12/2022   BUN 16 02/12/2022   NA 143 02/12/2022   K 3.9 02/12/2022   CL 108 02/12/2022   CO2 23 02/12/2022   GFR 53.6    Lab Results  Component Value Date   ALT 19 02/12/2022   AST 17 02/12/2022   ALKPHOS 110 02/12/2022   BILITOT 0.4 02/12/2022    Lab Results  Component  Value Date   TSH 1.66 02/12/2022   Patient Active Problem List   Diagnosis Date Noted   Urine frequency 02/19/2022   History of breast cancer 09/12/2021   Colon cancer screening 12/12/2020   Medicare annual wellness visit, subsequent 08/28/2017   Estrogen deficiency 08/28/2017   Prediabetes 06/13/2015   Obesity 06/13/2015   Routine general medical examination at a health care facility 09/14/2011   STRESS REACTION, ACUTE, WITH EMOTIONAL DISTURBANCE 05/12/2008   HYPERCHOLESTEROLEMIA 04/21/2007   Essential hypertension 04/21/2007   UTI'S, RECURRENT 04/21/2007   FIBROMYALGIA 04/21/2007   RETRACTION/LAG, EYELID 09/20/2006   PARESTHESIA 09/20/2006   MRI, BRAIN, ABNORMAL 09/20/2006   Past Medical History:  Diagnosis Date   Anxiety reaction    Back pain    Fibromyalgia    History of recurrent UTIs    HLD (hyperlipidemia)    Hypertension    Past Surgical History:  Procedure Laterality Date   ABDOMINAL HYSTERECTOMY     OVARIAN CYST REMOVAL     TONSILLECTOMY     Social History   Tobacco Use   Smoking status: Never   Smokeless tobacco: Never  Vaping Use   Vaping Use: Never used  Substance Use Topics   Alcohol use: Yes    Alcohol/week: 0.0 standard drinks of alcohol    Comment: rare   Drug use: No   Family History  Problem Relation Age of Onset   Thyroid nodules Mother    Heart disease Mother    Hypertension Father    Dementia Father    Alcohol abuse Brother    Hyperlipidemia Daughter    Hypertension Daughter    Cancer Maternal Aunt        breast   Breast cancer Maternal Aunt    Cancer Paternal Aunt        breast   Breast cancer Paternal Aunt    Allergies  Allergen Reactions   Amoxicillin-Pot Clavulanate Diarrhea    Diarrhea    Doxycycline Other (See Comments)    vomiting   Nitrofurantoin     REACTION: hives   Current Outpatient Medications on File Prior to Visit  Medication Sig Dispense Refill   Cholecalciferol (D3-1000 PO) Take 1 tablet by mouth  daily.     diclofenac Sodium (VOLTAREN) 1 % GEL Apply 2 g topically 4 (four) times daily. 100 g  0   Diclofenac Sodium CR 100 MG 24 hr tablet TAKE 1 TABLET BY MOUTH ONCE DAILY 90 tablet 0   HYDROcodone-acetaminophen (NORCO/VICODIN) 5-325 MG tablet Take 1-2 tablets by mouth every 6 (six) hours as needed for moderate pain. 30 tablet 0   Multiple Vitamins-Minerals (WOMENS MULTI PO) Take 1 tablet by mouth daily.       ondansetron (ZOFRAN ODT) 4 MG disintegrating tablet Take 1 tablet (4 mg total) by mouth every 8 (eight) hours as needed for nausea or vomiting. 20 tablet 0   No current facility-administered medications on file prior to visit.    Review of Systems  Constitutional:  Positive for fatigue. Negative for activity change, appetite change, fever and unexpected weight change.  HENT:  Negative for congestion, ear pain, rhinorrhea, sinus pressure and sore throat.   Eyes:  Negative for pain, redness and visual disturbance.  Respiratory:  Negative for cough, shortness of breath and wheezing.   Cardiovascular:  Negative for chest pain and palpitations.  Gastrointestinal:  Negative for abdominal pain, blood in stool, constipation and diarrhea.  Endocrine: Negative for polydipsia and polyuria.  Genitourinary:  Negative for dysuria, frequency and urgency.  Musculoskeletal:  Negative for arthralgias, back pain and myalgias.  Skin:  Negative for pallor and rash.       Hair loss with chemo   Allergic/Immunologic: Negative for environmental allergies.  Neurological:  Negative for dizziness, syncope and headaches.  Hematological:  Negative for adenopathy. Does not bruise/bleed easily.  Psychiatric/Behavioral:  Negative for decreased concentration and dysphoric mood. The patient is not nervous/anxious.        Objective:   Physical Exam Constitutional:      General: She is not in acute distress.    Appearance: Normal appearance. She is well-developed. She is obese. She is not ill-appearing or  diaphoretic.  HENT:     Head: Normocephalic and atraumatic.     Right Ear: Tympanic membrane, ear canal and external ear normal.     Left Ear: Tympanic membrane, ear canal and external ear normal.     Nose: Nose normal. No congestion.     Mouth/Throat:     Mouth: Mucous membranes are moist.     Pharynx: Oropharynx is clear. No posterior oropharyngeal erythema.  Eyes:     General: No scleral icterus.    Extraocular Movements: Extraocular movements intact.     Conjunctiva/sclera: Conjunctivae normal.     Pupils: Pupils are equal, round, and reactive to light.  Neck:     Thyroid: No thyromegaly.     Vascular: No carotid bruit or JVD.  Cardiovascular:     Rate and Rhythm: Normal rate and regular rhythm.     Pulses: Normal pulses.     Heart sounds: Normal heart sounds.     No gallop.  Pulmonary:     Effort: Pulmonary effort is normal. No respiratory distress.     Breath sounds: Normal breath sounds. No wheezing.     Comments: Good air exch Chest:     Chest wall: No tenderness.  Abdominal:     General: Bowel sounds are normal. There is no distension or abdominal bruit.     Palpations: Abdomen is soft. There is no mass.     Tenderness: There is no abdominal tenderness.     Hernia: No hernia is present.  Genitourinary:    Comments: Breast exam per onc/surg in middle of cancer treatment  Musculoskeletal:        General: No tenderness. Normal range  of motion.     Cervical back: Normal range of motion and neck supple. No rigidity. No muscular tenderness.     Right lower leg: No edema.     Left lower leg: No edema.     Comments: No kyphosis   Lymphadenopathy:     Cervical: No cervical adenopathy.  Skin:    General: Skin is warm and dry.     Coloration: Skin is not pale.     Findings: No erythema or rash.     Comments: Solar lentigines diffusely   Neurological:     Mental Status: She is alert. Mental status is at baseline.     Cranial Nerves: No cranial nerve deficit.      Motor: No abnormal muscle tone.     Coordination: Coordination normal.     Gait: Gait normal.     Deep Tendon Reflexes: Reflexes are normal and symmetric. Reflexes normal.  Psychiatric:        Mood and Affect: Mood normal.        Cognition and Memory: Cognition and memory normal.           Assessment & Plan:   Problem List Items Addressed This Visit       Cardiovascular and Mediastinum   Essential hypertension    bp in fair control at this time  BP Readings from Last 1 Encounters:  02/19/22 126/62  No changes needed Most recent labs reviewed  Disc lifstyle change with low sodium diet and exercise  Plan to continue tenormin 25 mg daily       Relevant Medications   rosuvastatin (CRESTOR) 5 MG tablet   atenolol (TENORMIN) 25 MG tablet     Genitourinary   UTI'S, RECURRENT    Symptoms from recent uti returned after tx with cipro  Pos ua today  cipro sent for 3 d pending culture  On chemo/ immune changes currently  Enc fluids  ER precautions noted  cx pending       Relevant Medications   trimethoprim (TRIMPEX) 100 MG tablet   Other Relevant Orders   POCT Urinalysis Dipstick (Automated) (Completed)   Urine Culture     Other   Colon cancer screening   History of breast cancer    Triple negative Under care of onc  Just finished chemo  About to start radiation  Doing very well so far        HYPERCHOLESTEROLEMIA    Disc goals for lipids and reasons to control them Rev last labs with pt Rev low sat fat diet in detail  Plan to continue crestor 5 mg daily  HDL down with less exercise , we expect that to improve when returning back to normal      Relevant Medications   rosuvastatin (CRESTOR) 5 MG tablet   atenolol (TENORMIN) 25 MG tablet   Obesity    In the middle of breast cancer treatment  Anxious to get back to normal diet and exercise soon      Prediabetes    Lab Results  Component Value Date   HGBA1C 6.5 02/12/2022  Planning to get back to  normal lifestyle and exercise after finishing cancer tx  Do expect improvement      Routine general medical examination at a health care facility - Primary    Reviewed health habits including diet and exercise and skin cancer prevention Reviewed appropriate screening tests for age  Also reviewed health mt list, fam hx and immunization status , as well as  social and family history   See HPI Labs reviewed  In middle of breast ca tx Will be able to get flu shot and pna vaccine once finished with tx and released by onc Colonoscopy due- should get clearance in the next 3-4 mo by onc to do this , she will call  dexa utd and nl in 2019 , she will return to supplements once done with ca tx       Urine frequency    Pos ua  Recurrent  In breast ca tx  cipro 3 d sent in pending cx       Relevant Orders   POCT Urinalysis Dipstick (Automated) (Completed)   Urine Culture

## 2022-02-19 NOTE — Assessment & Plan Note (Signed)
Triple negative Under care of onc  Just finished chemo  About to start radiation  Doing very well so far

## 2022-02-19 NOTE — Patient Instructions (Addendum)
Let us know when you want a colonscopy refer  Also for flu shot and pneumonia shot   Get back to a normal routine when done with treatment  Keep up good fluid intake  Get back to calcium and vit D when able   Leave a urine sample

## 2022-02-19 NOTE — Assessment & Plan Note (Signed)
Disc goals for lipids and reasons to control them Rev last labs with pt Rev low sat fat diet in detail  Plan to continue crestor 5 mg daily  HDL down with less exercise , we expect that to improve when returning back to normal

## 2022-02-19 NOTE — Assessment & Plan Note (Signed)
Reviewed health habits including diet and exercise and skin cancer prevention Reviewed appropriate screening tests for age  Also reviewed health mt list, fam hx and immunization status , as well as social and family history   See HPI Labs reviewed  In middle of breast ca tx Will be able to get flu shot and pna vaccine once finished with tx and released by onc Colonoscopy due- should get clearance in the next 3-4 mo by onc to do this , she will call  dexa utd and nl in 2019 , she will return to supplements once done with ca tx

## 2022-02-22 ENCOUNTER — Telehealth: Payer: Self-pay | Admitting: Family Medicine

## 2022-02-22 LAB — URINE CULTURE
MICRO NUMBER:: 14055533
SPECIMEN QUALITY:: ADEQUATE

## 2022-02-22 MED ORDER — SULFAMETHOXAZOLE-TRIMETHOPRIM 800-160 MG PO TABS: 1.0000 | ORAL_TABLET | Freq: Two times a day (BID) | ORAL | 0 refills | Status: DC

## 2022-02-22 NOTE — Telephone Encounter (Signed)
Uti is resistant to cipro I need to try bactrim Hold the trimethoprim while taking it  Sent to her pharmacy  Let us know if not improving

## 2022-02-22 NOTE — Telephone Encounter (Signed)
Pt notified of urine cx results and Dr. Tower's comments and instructions and verbalized understanding  

## 2022-02-28 DIAGNOSIS — Z171 Estrogen receptor negative status [ER-]: Secondary | ICD-10-CM | POA: Diagnosis not present

## 2022-02-28 DIAGNOSIS — C50311 Malignant neoplasm of lower-inner quadrant of right female breast: Secondary | ICD-10-CM | POA: Diagnosis not present

## 2022-02-28 DIAGNOSIS — Z9221 Personal history of antineoplastic chemotherapy: Secondary | ICD-10-CM | POA: Diagnosis not present

## 2022-03-16 DIAGNOSIS — C50311 Malignant neoplasm of lower-inner quadrant of right female breast: Secondary | ICD-10-CM | POA: Diagnosis not present

## 2022-03-20 ENCOUNTER — Telehealth (INDEPENDENT_AMBULATORY_CARE_PROVIDER_SITE_OTHER): Payer: Medicare Other | Admitting: Primary Care

## 2022-03-20 ENCOUNTER — Encounter: Payer: Self-pay | Admitting: Primary Care

## 2022-03-20 VITALS — Ht 62.25 in | Wt 175.0 lb

## 2022-03-20 DIAGNOSIS — J069 Acute upper respiratory infection, unspecified: Secondary | ICD-10-CM

## 2022-03-20 DIAGNOSIS — C50311 Malignant neoplasm of lower-inner quadrant of right female breast: Secondary | ICD-10-CM | POA: Diagnosis not present

## 2022-03-20 MED ORDER — HYDROCOD POLI-CHLORPHE POLI ER 10-8 MG/5ML PO SUER: 5.0000 mL | Freq: Two times a day (BID) | ORAL | 0 refills | Status: DC | PRN

## 2022-03-20 NOTE — Assessment & Plan Note (Signed)
Symptoms appear viral at this point. I did encourage her to take a Covid-19 test, especially since she will be going in today for radiation.   Treat with conservative measures at this point. I did urge her to notify me Friday this week if symptoms persist.  Rx for Tussionex provided per patient request.  Discussed use of Flonase and Dayquil.   Return precautions provided.

## 2022-03-20 NOTE — Progress Notes (Signed)
Patient ID: Amy Mills, female    DOB: March 15, 1951, 71 y.o.   MRN: [id number]  Virtual visit completed through [employer], a video enabled telemedicine application. Due to national recommendations of social distancing due to COVID-19, a virtual visit is felt to be most appropriate for this patient at this time. Reviewed limitations, risks, security and privacy concerns of performing a virtual visit and the availability of in person appointments. I also reviewed that there may be a patient responsible charge related to this service. The patient agreed to proceed.   Patient location: home Provider location: [place] at [facility], office Persons participating in this virtual visit: patient, provider   If any vitals were documented, they were collected by patient at home unless specified below.    Ht 5' 2.25" (1.581 m)   Wt 175 lb (79.4 kg)   LMP 05/08/1995   BMI 31.75 kg/m    CC: Cough Subjective:   HPI: Amy Mills is a 71 y.o. female patient of Dr. Milinda Antis with a history of hypertension, recurrent UTI, prediabetes, breast cancer presenting on 03/20/2022 for Cough (X5 days after traveling has been experiencing cough, congestion in chest and head, sinus drainage./Declines fever, sweats, chills, body aches)  Symptom onset four days ago with sore throat. She then developed cough, chest congestion, nasal congestion. She denies fevers, chills, body aches.   She has recently been traveling on planes and has been around a lot of people for a funeral. She's been taking Robitussin, Dayquil without improvement. She is not sleeping well during the night.   She is a non smoker, no history of asthma. She has not tested for Covid-19 as she didn't feel that this was warranted. She completed her last chemotherapy treatment 6 weeks ago, is due for her first radiation treatment today.   She is requesting Tussionex as this has worked for her in the past, helps her to rest.      Relevant past medical,  surgical, family and social history reviewed and updated as indicated. Interim medical history since our last visit reviewed. Allergies and medications reviewed and updated. Outpatient Medications Prior to Visit  Medication Sig Dispense Refill   atenolol (TENORMIN) 25 MG tablet Take 1 tablet (25 mg total) by mouth daily. 90 tablet 3   Cholecalciferol (D3-1000 PO) Take 1 tablet by mouth daily.     Diclofenac Sodium CR 100 MG 24 hr tablet TAKE 1 TABLET BY MOUTH ONCE DAILY 90 tablet 0   Multiple Vitamins-Minerals (WOMENS MULTI PO) Take 1 tablet by mouth daily.       ondansetron (ZOFRAN ODT) 4 MG disintegrating tablet Take 1 tablet (4 mg total) by mouth every 8 (eight) hours as needed for nausea or vomiting. 20 tablet 0   rosuvastatin (CRESTOR) 5 MG tablet TAKE 1 TABLET BY MOUTH ONCE A DAY 90 tablet 3   sulfamethoxazole-trimethoprim (BACTRIM DS) 800-160 MG tablet Take 1 tablet by mouth 2 (two) times daily. 10 tablet 0   SUMAtriptan (IMITREX) 50 MG tablet TAKE 1 TABLET EVERY 2 HOURS AS NEEDED FOR MIGRAINE--MAX 2 DOSES PER DAY 10 tablet 11   trimethoprim (TRIMPEX) 100 MG tablet Take 1 tablet (100 mg total) by mouth daily. 90 tablet 3   HYDROcodone-acetaminophen (NORCO/VICODIN) 5-325 MG tablet Take 1-2 tablets by mouth every 6 (six) hours as needed for moderate pain. (Patient not taking: Reported on 03/20/2022) 30 tablet 0   diclofenac Sodium (VOLTAREN) 1 % GEL Apply 2 g topically 4 (four) times daily. (Patient  not taking: Reported on 03/20/2022) 100 g 0   No facility-administered medications prior to visit.     Per HPI unless specifically indicated in ROS section below Review of Systems  Constitutional:  Positive for fatigue.  HENT:  Positive for congestion and sore throat.   Respiratory:  Positive for cough. Negative for shortness of breath.   Neurological:  Negative for headaches.   Objective:  Ht 5' 2.25" (1.581 m)   Wt 175 lb (79.4 kg)   LMP 05/08/1995   BMI 31.75 kg/m   Wt Readings  from Last 3 Encounters:  03/20/22 175 lb (79.4 kg)  02/19/22 179 lb 4 oz (81.3 kg)  12/15/21 180 lb (81.6 kg)       Physical exam: General: Alert and oriented x 3, no distress, appears tired and acutely ill.   Pulmonary: Speaks in complete sentences without increased work of breathing, no cough during visit. Nasal congestion noted.   Psychiatric: Normal mood, thought content, and behavior.     Results for orders placed or performed in visit on 02/19/22  Urine Culture   Specimen: Urine  Result Value Ref Range   MICRO NUMBER: [id number]    SPECIMEN QUALITY: Adequate    Sample Source URINE    STATUS: FINAL    ISOLATE 1: Escherichia coli (A)       Susceptibility   Escherichia coli - URINE CULTURE, REFLEX    AMOX/CLAVULANIC 4 Sensitive     AMPICILLIN >=32 Resistant     AMPICILLIN/SULBACTAM 16 Intermediate     CEFAZOLIN* <=4 Not Reportable      * For infections other than uncomplicated UTI caused by E. coli, K. pneumoniae or P. mirabilis: Cefazolin is resistant if MIC > or = 8 mcg/mL. (Distinguishing susceptible versus intermediate for isolates with MIC < or = 4 mcg/mL requires additional testing.) For uncomplicated UTI caused by E. coli, K. pneumoniae or P. mirabilis: Cefazolin is susceptible if MIC <32 mcg/mL and predicts susceptible to the oral agents cefaclor, cefdinir, cefpodoxime, cefprozil, cefuroxime, cephalexin and loracarbef.     CEFTAZIDIME <=1 Sensitive     CEFEPIME <=1 Sensitive     CEFTRIAXONE <=1 Sensitive     CIPROFLOXACIN >=4 Resistant     LEVOFLOXACIN >=8 Resistant     GENTAMICIN >=16 Resistant     IMIPENEM <=0.25 Sensitive     NITROFURANTOIN <=16 Sensitive     PIP/TAZO <=4 Sensitive     TOBRAMYCIN 8 Intermediate     TRIMETH/SULFA* <=20 Sensitive      * For infections other than uncomplicated UTI caused by E. coli, K. pneumoniae or P. mirabilis: Cefazolin is resistant if MIC > or = 8 mcg/mL. (Distinguishing susceptible versus intermediate for  isolates with MIC < or = 4 mcg/mL requires additional testing.) For uncomplicated UTI caused by E. coli, K. pneumoniae or P. mirabilis: Cefazolin is susceptible if MIC <32 mcg/mL and predicts susceptible to the oral agents cefaclor, cefdinir, cefpodoxime, cefprozil, cefuroxime, cephalexin and loracarbef. Legend: S = Susceptible  I = Intermediate R = Resistant  NS = Not susceptible * = Not tested  NR = Not reported **NN = See antimicrobic comments   POCT Urinalysis Dipstick (Automated)  Result Value Ref Range   Color, UA Amber    Clarity, UA Cloudy    Glucose, UA Negative Negative   Bilirubin, UA 1 mg/dL    Ketones, UA Negative    Spec Grav, UA >=1.030 (A) 1.010 - 1.025   Blood, UA Negative    pH, UA  6.0 5.0 - 8.0   Protein, UA Positive (A) Negative   Urobilinogen, UA 0.2 0.2 or 1.0 E.U./dL   Nitrite, UA Positive    Leukocytes, UA Moderate (2+) (A) Negative   Assessment & Plan:   Problem List Items Addressed This Visit       Respiratory   Viral URI with cough - Primary    Symptoms appear viral at this point. I did encourage her to take a Covid-19 test, especially since she will be going in today for radiation.   Treat with conservative measures at this point. I did urge her to notify me Friday this week if symptoms persist.  Rx for Tussionex provided per patient request.  Discussed use of Flonase and Dayquil.   Return precautions provided.       Relevant Medications   chlorpheniramine-HYDROcodone (TUSSIONEX) 10-8 MG/5ML     Meds ordered this encounter  Medications   chlorpheniramine-HYDROcodone (TUSSIONEX) 10-8 MG/5ML    Sig: Take 5 mLs by mouth every 12 (twelve) hours as needed for cough.    Dispense:  50 mL    Refill:  0    Order Specific Question:   Supervising Provider    Answer:   BEDSOLE, AMY E [[id number]]   No orders of the defined types were placed in this encounter.   I discussed the assessment and treatment plan with the patient. The patient was  provided an opportunity to ask questions and all were answered. The patient agreed with the plan and demonstrated an understanding of the instructions. The patient was advised to call back or seek an in-person evaluation if the symptoms worsen or if the condition fails to improve as anticipated.  Follow up plan:  You may take the cough suppressant every 12 hours as needed for cough and rest. Caution this medication contains codeine which may cause drowsiness.   Start Flonase nasal spray.  Continue Dayquil as needed.  Please contact me Friday this week if you start to feel worse.   It was a pleasure meeting you!    Doreene Nest, NP

## 2022-03-20 NOTE — Patient Instructions (Signed)
You may take the cough suppressant every 12 hours as needed for cough and rest. Caution this medication contains codeine which may cause drowsiness.   Start Flonase nasal spray.  Continue Dayquil as needed.  Please contact me Friday this week if you start to feel worse.   It was a pleasure meeting you!

## 2022-03-21 DIAGNOSIS — C50311 Malignant neoplasm of lower-inner quadrant of right female breast: Secondary | ICD-10-CM | POA: Diagnosis not present

## 2022-03-26 DIAGNOSIS — C50311 Malignant neoplasm of lower-inner quadrant of right female breast: Secondary | ICD-10-CM | POA: Diagnosis not present

## 2022-04-02 DIAGNOSIS — C50311 Malignant neoplasm of lower-inner quadrant of right female breast: Secondary | ICD-10-CM | POA: Diagnosis not present

## 2022-04-06 DIAGNOSIS — C50311 Malignant neoplasm of lower-inner quadrant of right female breast: Secondary | ICD-10-CM | POA: Diagnosis not present

## 2022-04-09 DIAGNOSIS — C50311 Malignant neoplasm of lower-inner quadrant of right female breast: Secondary | ICD-10-CM | POA: Diagnosis not present

## 2022-04-13 ENCOUNTER — Other Ambulatory Visit: Payer: Self-pay | Admitting: Family Medicine

## 2022-04-13 ENCOUNTER — Encounter: Payer: Self-pay | Admitting: Family Medicine

## 2022-04-13 ENCOUNTER — Ambulatory Visit (INDEPENDENT_AMBULATORY_CARE_PROVIDER_SITE_OTHER): Payer: Medicare Other | Admitting: Family Medicine

## 2022-04-13 VITALS — BP 130/78 | HR 64 | Temp 97.6°F | Ht 62.25 in | Wt 177.6 lb

## 2022-04-13 DIAGNOSIS — N390 Urinary tract infection, site not specified: Secondary | ICD-10-CM | POA: Insufficient documentation

## 2022-04-13 DIAGNOSIS — B351 Tinea unguium: Secondary | ICD-10-CM | POA: Diagnosis not present

## 2022-04-13 LAB — POC URINALSYSI DIPSTICK (AUTOMATED)
Bilirubin, UA: NEGATIVE
Blood, UA: NEGATIVE
Glucose, UA: NEGATIVE
Ketones, UA: NEGATIVE
Nitrite, UA: POSITIVE
Protein, UA: POSITIVE — AB
Spec Grav, UA: 1.02 (ref 1.010–1.025)
Urobilinogen, UA: 0.2 E.U./dL
pH, UA: 6 (ref 5.0–8.0)

## 2022-04-13 MED ORDER — CEPHALEXIN 500 MG PO CAPS: 500.0000 mg | ORAL_CAPSULE | Freq: Two times a day (BID) | ORAL | 0 refills | Status: DC

## 2022-04-13 NOTE — Patient Instructions (Addendum)
Drink lots of water  Avoid artificial sweetener   Take the keflex as directed for uti  If symptoms worsen or if nausea or fever let us know  We will contact you when the culture returns   I want to have you see a podiatrist for possible fungal nails   I placed a referral  You will get a call

## 2022-04-13 NOTE — Assessment & Plan Note (Signed)
Thickened discolored nails - toes and now some in fingers Causing discomfort  In setting of recent cancer tx incl chemo  Has polish on today but I note R great toe nail is thick  I think she would benefit from podiatry visit to eval and confirm this is fungal May benefit from debridement of nails and disc oral med if applicable   For now inst to avoid manicures

## 2022-04-13 NOTE — Assessment & Plan Note (Signed)
In setting of freq uti (takes proph trimethoprim)  Ua pos Fluids recommended  Last uti was e coli resist to quinolones Px keflex today  Inst to call if worse  Cx ordered and will update when this returns

## 2022-04-13 NOTE — Progress Notes (Unsigned)
Subjective:    Patient ID: Amy Mills, female    DOB: 05-19-1950, 71 y.o.   MRN: [id number]  HPI Pt presents for urinary symptoms  Wants to discuss nail fungus   Wt Readings from Last 3 Encounters:  04/13/22 177 lb 9.6 oz (80.6 kg)  03/20/22 175 lb (79.4 kg)  02/19/22 179 lb 4 oz (81.3 kg)   32.22 kg/m    Symptoms started 2 wk ago -has been very busy  Lost one her her sons   Bladder spasms No burning  Freq/urgency  Odor in urine   Trying to drink lots of water    Her church is very supportive and helpful with her grief  They offer counseling    Last uti was 02/2022  E coli resistant to cipro  Was treated with bactrim   Takes trimethoprim for prophylaxis   In rad tx for breast cancer   Lab Results  Component Value Date   CREATININE 1.05 02/12/2022   BUN 16 02/12/2022   NA 143 02/12/2022   K 3.9 02/12/2022   CL 108 02/12/2022   CO2 23 02/12/2022   GFR 53.6  Nail fungus is worse  Toes  Fingers -now  Does notice pain  Tea tree oil worked in the past Interested in oral therapy   Patient Active Problem List   Diagnosis Date Noted   Onychomycosis 04/13/2022   Acute UTI (urinary tract infection) 04/13/2022   Urine frequency 02/19/2022   History of breast cancer 09/12/2021   Colon cancer screening 12/12/2020   Medicare annual wellness visit, subsequent 08/28/2017   Estrogen deficiency 08/28/2017   Prediabetes 06/13/2015   Obesity 06/13/2015   Routine general medical examination at a health care facility 09/14/2011   STRESS REACTION, ACUTE, WITH EMOTIONAL DISTURBANCE 05/12/2008   HYPERCHOLESTEROLEMIA 04/21/2007   Essential hypertension 04/21/2007   UTI'S, RECURRENT 04/21/2007   FIBROMYALGIA 04/21/2007   RETRACTION/LAG, EYELID 09/20/2006   PARESTHESIA 09/20/2006   MRI, BRAIN, ABNORMAL 09/20/2006   Past Medical History:  Diagnosis Date   Anxiety reaction    Back pain    Fibromyalgia    History of recurrent UTIs    HLD (hyperlipidemia)     Hypertension    Past Surgical History:  Procedure Laterality Date   ABDOMINAL HYSTERECTOMY     OVARIAN CYST REMOVAL     TONSILLECTOMY     Social History   Tobacco Use   Smoking status: Never   Smokeless tobacco: Never  Vaping Use   Vaping Use: Never used  Substance Use Topics   Alcohol use: Yes    Alcohol/week: 0.0 standard drinks of alcohol    Comment: rare   Drug use: No   Family History  Problem Relation Age of Onset   Thyroid nodules Mother    Heart disease Mother    Hypertension Father    Dementia Father    Alcohol abuse Brother    Hyperlipidemia Daughter    Hypertension Daughter    Cancer Maternal Aunt        breast   Breast cancer Maternal Aunt    Cancer Paternal Aunt        breast   Breast cancer Paternal Aunt    Allergies  Allergen Reactions   Amoxicillin-Pot Clavulanate Diarrhea    Diarrhea    Doxycycline Other (See Comments)    vomiting   Nitrofurantoin     REACTION: hives   Current Outpatient Medications on File Prior to Visit  Medication Sig Dispense Refill  atenolol (TENORMIN) 25 MG tablet Take 1 tablet (25 mg total) by mouth daily. 90 tablet 3   chlorpheniramine-HYDROcodone (TUSSIONEX) 10-8 MG/5ML Take 5 mLs by mouth every 12 (twelve) hours as needed for cough. 50 mL 0   Cholecalciferol (D3-1000 PO) Take 1 tablet by mouth daily.     Diclofenac Sodium CR 100 MG 24 hr tablet TAKE 1 TABLET BY MOUTH ONCE DAILY 90 tablet 0   Multiple Vitamins-Minerals (WOMENS MULTI PO) Take 1 tablet by mouth daily.       ondansetron (ZOFRAN ODT) 4 MG disintegrating tablet Take 1 tablet (4 mg total) by mouth every 8 (eight) hours as needed for nausea or vomiting. 20 tablet 0   rosuvastatin (CRESTOR) 5 MG tablet TAKE 1 TABLET BY MOUTH ONCE A DAY 90 tablet 3   SUMAtriptan (IMITREX) 50 MG tablet TAKE 1 TABLET EVERY 2 HOURS AS NEEDED FOR MIGRAINE--MAX 2 DOSES PER DAY 10 tablet 11   trimethoprim (TRIMPEX) 100 MG tablet Take 1 tablet (100 mg total) by mouth daily. 90 tablet  3   No current facility-administered medications on file prior to visit.    Review of Systems  Constitutional:  Positive for fatigue. Negative for activity change, appetite change and fever.  HENT:  Negative for congestion and sore throat.   Eyes:  Negative for itching and visual disturbance.  Respiratory:  Negative for cough and shortness of breath.   Cardiovascular:  Negative for leg swelling.  Gastrointestinal:  Negative for abdominal distention, abdominal pain, constipation, diarrhea and nausea.  Endocrine: Negative for cold intolerance and polydipsia.  Genitourinary:  Positive for frequency and urgency. Negative for difficulty urinating, dysuria, flank pain and hematuria.  Musculoskeletal:  Negative for myalgias.  Skin:  Negative for rash.  Allergic/Immunologic: Negative for immunocompromised state.  Neurological:  Negative for dizziness and weakness.  Hematological:  Negative for adenopathy.       Objective:   Physical Exam        Assessment & Plan:   Problem List Items Addressed This Visit       Musculoskeletal and Integument   Onychomycosis    Thickened discolored nails - toes and now some in fingers Causing discomfort  In setting of recent cancer tx incl chemo  Has polish on today but I note R great toe nail is thick  I think she would benefit from podiatry visit to eval and confirm this is fungal May benefit from debridement of nails and disc oral med if applicable   For now inst to avoid manicures       Relevant Medications   cephALEXin (KEFLEX) 500 MG capsule   Other Relevant Orders   Ambulatory referral to Podiatry     Genitourinary   Acute UTI (urinary tract infection)    In setting of freq uti (takes proph trimethoprim)  Ua pos Fluids recommended  Last uti was e coli resist to quinolones Px keflex today  Inst to call if worse  Cx ordered and will update when this returns       Relevant Medications   cephALEXin (KEFLEX) 500 MG capsule    Other Relevant Orders   Urine Culture   POCT Urinalysis Dipstick (Automated) (Completed)   UTI'S, RECURRENT - Primary   Relevant Medications   cephALEXin (KEFLEX) 500 MG capsule   Other Relevant Orders   POCT Urinalysis Dipstick (Automated) (Completed)

## 2022-04-15 LAB — URINE CULTURE
MICRO NUMBER:: 14290201
SPECIMEN QUALITY:: ADEQUATE

## 2022-04-16 DIAGNOSIS — C50311 Malignant neoplasm of lower-inner quadrant of right female breast: Secondary | ICD-10-CM | POA: Diagnosis not present

## 2022-04-16 NOTE — Telephone Encounter (Signed)
CPE was on 02/19/22, pt was also seen for acute appt on 04/13/22, last filled on 01/12/22 #90 tabs/ 0 refills

## 2022-04-23 DIAGNOSIS — C50311 Malignant neoplasm of lower-inner quadrant of right female breast: Secondary | ICD-10-CM | POA: Diagnosis not present

## 2022-04-23 DIAGNOSIS — Z171 Estrogen receptor negative status [ER-]: Secondary | ICD-10-CM | POA: Diagnosis not present

## 2022-05-02 ENCOUNTER — Telehealth: Payer: Self-pay | Admitting: Family Medicine

## 2022-05-02 DIAGNOSIS — N390 Urinary tract infection, site not specified: Secondary | ICD-10-CM

## 2022-05-02 NOTE — Telephone Encounter (Signed)
Spoke to patient by telephone and was advised that she is having frequency, odor and bladder spasms. Patient stated that she felt like the symptoms did completely clear up. Patient stated that her symptoms started back Christmas Day. Patient stated that the symptoms are the same as the last time that she had a UTI. Patient stated that she  has been dealing with UTI's for years. [facility]

## 2022-05-02 NOTE — Addendum Note (Signed)
Addended by: Roxy Manns A on: 05/02/2022 06:59 PM   Modules accepted: Orders

## 2022-05-02 NOTE — Telephone Encounter (Signed)
We need a culture -please drop off sample and we will treat if positive  Unsure if new or not  Thanks

## 2022-05-02 NOTE — Telephone Encounter (Signed)
Patient will be by in the morning to give sample. Did you want a dip as well or just the culture.

## 2022-05-02 NOTE — Telephone Encounter (Signed)
Last uti was tx with keflex earlier in December What symptoms is she having? Did she get better early on or not entirely? Thanks

## 2022-05-02 NOTE — Telephone Encounter (Signed)
Patient called in and stated that she has another UTI. She was wondering if she come by and leave an urine sample. Please advise. Thank you!

## 2022-05-02 NOTE — Telephone Encounter (Signed)
Dip and culture The orders are in  Thanks

## 2022-05-03 ENCOUNTER — Other Ambulatory Visit (INDEPENDENT_AMBULATORY_CARE_PROVIDER_SITE_OTHER): Payer: Medicare Other

## 2022-05-03 ENCOUNTER — Telehealth: Payer: Self-pay | Admitting: Family Medicine

## 2022-05-03 DIAGNOSIS — N390 Urinary tract infection, site not specified: Secondary | ICD-10-CM

## 2022-05-03 LAB — POC URINALSYSI DIPSTICK (AUTOMATED)
Bilirubin, UA: NEGATIVE
Glucose, UA: NEGATIVE
Ketones, UA: NEGATIVE
Nitrite, UA: NEGATIVE
Protein, UA: NEGATIVE
Spec Grav, UA: 1.02 (ref 1.010–1.025)
Urobilinogen, UA: 0.2 E.U./dL
pH, UA: 5.5 (ref 5.0–8.0)

## 2022-05-03 MED ORDER — CEPHALEXIN 500 MG PO CAPS: 500.0000 mg | ORAL_CAPSULE | Freq: Two times a day (BID) | ORAL | 0 refills | Status: DC

## 2022-05-03 NOTE — Telephone Encounter (Signed)
Pt notified of UA results and Dr. Tower's comments  

## 2022-05-03 NOTE — Telephone Encounter (Signed)
I sent more keflex to pharmacy for uti - ua is pos Sent for cx Will update when this returns   Drink fluids Alert [place] if symptoms worsen in the meantime

## 2022-05-05 LAB — URINE CULTURE
MICRO NUMBER:: 14365441
SPECIMEN QUALITY:: ADEQUATE

## 2022-05-24 ENCOUNTER — Ambulatory Visit: Payer: Medicare Other | Admitting: Orthopaedic Surgery

## 2022-05-24 ENCOUNTER — Encounter: Payer: Self-pay | Admitting: Orthopaedic Surgery

## 2022-05-24 ENCOUNTER — Ambulatory Visit (INDEPENDENT_AMBULATORY_CARE_PROVIDER_SITE_OTHER): Payer: Medicare Other

## 2022-05-24 DIAGNOSIS — M25572 Pain in left ankle and joints of left foot: Secondary | ICD-10-CM

## 2022-05-24 NOTE — Progress Notes (Signed)
The patient is a 72 year old female well-known to me.  We replaced her knee before.  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  Over the years she has had intermittent off-and-on swelling with her left ankle but recently she has had swelling that is not going away.  She has been dealing with breast cancer and surviving that.  She has tried an ankle sleeve and that is really not helped a lot and made things a little worse.  She says x-rays when she first had her ankle injury were negative for any type of fracture.  Examination of her left ankle today in comparison the right ankle does show significant medial and lateral swelling.  The range of motion of her ankle is entirely full and her Achilles is intact.  However she has significant difficulty performing a toe raise on that side compared to the right side and it does show some dysfunction of the posterior tibial tendon.  3 views of the right ankle shows significant arthritic changes around the medial aspect of the ankle and a cortical irregularity with calcifications in the soft tissue.  The ankle mortise is well aligned and intact.  I would like to send her to outpatient physical therapy for any modalities that can help with her ankle discomfort given her diagnosis.  She agrees with this as well.  I gave her prescription for outpatient physical therapy for [facility] physical therapy in [place].  I would then like to see her back in about 6 weeks to see how she is doing overall.

## 2022-06-06 DIAGNOSIS — C50311 Malignant neoplasm of lower-inner quadrant of right female breast: Secondary | ICD-10-CM | POA: Diagnosis not present

## 2022-06-06 DIAGNOSIS — Z171 Estrogen receptor negative status [ER-]: Secondary | ICD-10-CM | POA: Diagnosis not present

## 2022-06-12 DIAGNOSIS — M25572 Pain in left ankle and joints of left foot: Secondary | ICD-10-CM | POA: Diagnosis not present

## 2022-06-12 DIAGNOSIS — R2689 Other abnormalities of gait and mobility: Secondary | ICD-10-CM | POA: Diagnosis not present

## 2022-06-14 DIAGNOSIS — Z79899 Other long term (current) drug therapy: Secondary | ICD-10-CM | POA: Diagnosis not present

## 2022-06-14 DIAGNOSIS — Z23 Encounter for immunization: Secondary | ICD-10-CM | POA: Diagnosis not present

## 2022-06-14 DIAGNOSIS — C50311 Malignant neoplasm of lower-inner quadrant of right female breast: Secondary | ICD-10-CM | POA: Diagnosis not present

## 2022-06-14 DIAGNOSIS — Z171 Estrogen receptor negative status [ER-]: Secondary | ICD-10-CM | POA: Diagnosis not present

## 2022-06-14 DIAGNOSIS — Z9221 Personal history of antineoplastic chemotherapy: Secondary | ICD-10-CM | POA: Diagnosis not present

## 2022-06-14 DIAGNOSIS — Z1379 Encounter for other screening for genetic and chromosomal anomalies: Secondary | ICD-10-CM | POA: Diagnosis not present

## 2022-06-14 DIAGNOSIS — Z923 Personal history of irradiation: Secondary | ICD-10-CM | POA: Diagnosis not present

## 2022-06-14 LAB — COMPREHENSIVE METABOLIC PANEL: eGFR: 79

## 2022-06-14 LAB — BASIC METABOLIC PANEL
BUN: 24 — AB (ref 4–21)
Creatinine: 0.8 (ref 0.5–1.1)

## 2022-06-14 LAB — CBC AND DIFFERENTIAL
Hemoglobin: 12.6 (ref 12.0–16.0)
WBC: 6.4

## 2022-06-15 DIAGNOSIS — M25572 Pain in left ankle and joints of left foot: Secondary | ICD-10-CM | POA: Diagnosis not present

## 2022-06-15 DIAGNOSIS — R2689 Other abnormalities of gait and mobility: Secondary | ICD-10-CM | POA: Diagnosis not present

## 2022-06-19 DIAGNOSIS — R2689 Other abnormalities of gait and mobility: Secondary | ICD-10-CM | POA: Diagnosis not present

## 2022-06-19 DIAGNOSIS — M25572 Pain in left ankle and joints of left foot: Secondary | ICD-10-CM | POA: Diagnosis not present

## 2022-06-22 DIAGNOSIS — M25572 Pain in left ankle and joints of left foot: Secondary | ICD-10-CM | POA: Diagnosis not present

## 2022-06-22 DIAGNOSIS — R2689 Other abnormalities of gait and mobility: Secondary | ICD-10-CM | POA: Diagnosis not present

## 2022-06-25 DIAGNOSIS — R2689 Other abnormalities of gait and mobility: Secondary | ICD-10-CM | POA: Diagnosis not present

## 2022-06-25 DIAGNOSIS — M25572 Pain in left ankle and joints of left foot: Secondary | ICD-10-CM | POA: Diagnosis not present

## 2022-06-28 DIAGNOSIS — M25572 Pain in left ankle and joints of left foot: Secondary | ICD-10-CM | POA: Diagnosis not present

## 2022-06-28 DIAGNOSIS — R2689 Other abnormalities of gait and mobility: Secondary | ICD-10-CM | POA: Diagnosis not present

## 2022-07-03 ENCOUNTER — Telehealth (INDEPENDENT_AMBULATORY_CARE_PROVIDER_SITE_OTHER): Payer: Medicare Other | Admitting: Family

## 2022-07-03 ENCOUNTER — Encounter: Payer: Self-pay | Admitting: Family

## 2022-07-03 DIAGNOSIS — J069 Acute upper respiratory infection, unspecified: Secondary | ICD-10-CM

## 2022-07-03 MED ORDER — HYDROCOD POLI-CHLORPHE POLI ER 10-8 MG/5ML PO SUER: 5.0000 mL | Freq: Two times a day (BID) | ORAL | 0 refills | Status: DC | PRN

## 2022-07-03 MED ORDER — AZITHROMYCIN 250 MG PO TABS: ORAL_TABLET | ORAL | 0 refills | Status: DC

## 2022-07-03 NOTE — Progress Notes (Signed)
Amy Mills is a 72 y.o. female with the following history as recorded in EpicCare:  Patient Active Problem List   Diagnosis Date Noted   Onychomycosis 04/13/2022   Acute UTI (urinary tract infection) 04/13/2022   Urine frequency 02/19/2022   History of breast cancer 09/12/2021   Colon cancer screening 12/12/2020   Medicare annual wellness visit, subsequent 08/28/2017   Estrogen deficiency 08/28/2017   Prediabetes 06/13/2015   Obesity 06/13/2015   Routine general medical examination at a health care facility 09/14/2011   STRESS REACTION, ACUTE, WITH EMOTIONAL DISTURBANCE 05/12/2008   HYPERCHOLESTEROLEMIA 04/21/2007   Essential hypertension 04/21/2007   UTI'S, RECURRENT 04/21/2007   FIBROMYALGIA 04/21/2007   RETRACTION/LAG, EYELID 09/20/2006   PARESTHESIA 09/20/2006   MRI, BRAIN, ABNORMAL 09/20/2006    Current Outpatient Medications  Medication Sig Dispense Refill   atenolol (TENORMIN) 25 MG tablet Take 1 tablet (25 mg total) by mouth daily. 90 tablet 3   azithromycin (ZITHROMAX Z-PAK) 250 MG tablet Take 2 tablets (500 mg) PO today, then 1 tablet (250 mg) PO daily x4 days. 6 tablet 0   Cholecalciferol (D3-1000 PO) Take 1 tablet by mouth daily.     Diclofenac Sodium CR 100 MG 24 hr tablet Take 1 tablet (100 mg total) by mouth daily as needed for pain. As needed with food 90 tablet 0   Multiple Vitamins-Minerals (WOMENS MULTI PO) Take 1 tablet by mouth daily.       ondansetron (ZOFRAN ODT) 4 MG disintegrating tablet Take 1 tablet (4 mg total) by mouth every 8 (eight) hours as needed for nausea or vomiting. 20 tablet 0   rosuvastatin (CRESTOR) 5 MG tablet TAKE 1 TABLET BY MOUTH ONCE A DAY 90 tablet 3   SUMAtriptan (IMITREX) 50 MG tablet TAKE 1 TABLET EVERY 2 HOURS AS NEEDED FOR MIGRAINE--MAX 2 DOSES PER DAY 10 tablet 11   trimethoprim (TRIMPEX) 100 MG tablet Take 1 tablet (100 mg total) by mouth daily. 90 tablet 3   chlorpheniramine-HYDROcodone (TUSSIONEX) 10-8 MG/5ML Take 5 mLs by  mouth every 12 (twelve) hours as needed for cough. 50 mL 0   No current facility-administered medications for this visit.    Allergies: Amoxicillin-pot clavulanate, Doxycycline, and Nitrofurantoin  Past Medical History:  Diagnosis Date   Anxiety reaction    Back pain    Fibromyalgia    History of recurrent UTIs    HLD (hyperlipidemia)    Hypertension     Past Surgical History:  Procedure Laterality Date   ABDOMINAL HYSTERECTOMY     OVARIAN CYST REMOVAL     TONSILLECTOMY      Family History  Problem Relation Age of Onset   Thyroid nodules Mother    Heart disease Mother    Hypertension Father    Dementia Father    Alcohol abuse Brother    Hyperlipidemia Daughter    Hypertension Daughter    Cancer Maternal Aunt        breast   Breast cancer Maternal Aunt    Cancer Paternal Aunt        breast   Breast cancer Paternal Aunt     Social History   Tobacco Use   Smoking status: Never   Smokeless tobacco: Never  Substance Use Topics   Alcohol use: Yes    Alcohol/week: 0.0 standard drinks of alcohol    Comment: rare    Subjective:    I connected with Amy Mills on 07/03/22 at  2:00 PM EST by a video enabled  telemedicine application and verified that I am speaking with the correct person using two identifiers.   I discussed the limitations of evaluation and management by telemedicine and the availability of in person appointments. The patient expressed understanding and agreed to proceed. Provider in office/ patient is at home; provider and patient are only 2 people on video call.   Cough/ congestion x 9-10 days; no OTC medications; cough is keeping awake at night- requesting refill on Tussionex; has done well on Z-pak in the past;    Objective:  There were no vitals filed for this visit.  General: Well developed, well nourished, in no acute distress  Skin : Warm and dry.  Head: Normocephalic and atraumatic  Lungs: Respirations unlabored;  Neurologic: Alert and  oriented; speech intact; face symmetrical; moves all extremities well; CNII-XII intact without focal deficit   Assessment:  1. Acute URI     Plan:  Rx for Z-pak and Tussionex; increase fluids, rest and will need to follow up with her PCP in person if symptoms persist;   No follow-ups on file.  No orders of the defined types were placed in this encounter.   Requested Prescriptions   Signed Prescriptions Disp Refills   azithromycin (ZITHROMAX Z-PAK) 250 MG tablet 6 tablet 0    Sig: Take 2 tablets (500 mg) PO today, then 1 tablet (250 mg) PO daily x4 days.   chlorpheniramine-HYDROcodone (TUSSIONEX) 10-8 MG/5ML 50 mL 0    Sig: Take 5 mLs by mouth every 12 (twelve) hours as needed for cough.

## 2022-07-05 ENCOUNTER — Encounter: Payer: Self-pay | Admitting: Orthopaedic Surgery

## 2022-07-05 ENCOUNTER — Ambulatory Visit: Payer: Medicare Other | Admitting: Orthopaedic Surgery

## 2022-07-05 DIAGNOSIS — M25572 Pain in left ankle and joints of left foot: Secondary | ICD-10-CM

## 2022-07-05 NOTE — Progress Notes (Signed)
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  She has been going to physical therapy and has 2 more sessions and said that her ankle is not 100% but it is significantly better.  She has been having some left knee pain.  We did perform arthroscopic surgery on her left knee back in November 2022 and has only been hurting over the last day in the back of her knee when she was standing in the kitchen yesterday and the pain did wake her up last night in bed.  Left knee shows no effusion with good range of motion.  Her pain seems to be posterior but there is negative Lachman's and negative McMurray's.  Her left ankle is moving much better overall with minimal pain and minimal swelling.  She will go to her last 2 therapy sessions for her ankle.  If her knee worsens at all in any way she knows to let us know.  I offered her steroid injection but she says is not bad enough right now to consider that but she knows to come see [place] if it is not getting better.  All question concerns were addressed and answered.

## 2022-07-06 DIAGNOSIS — R2689 Other abnormalities of gait and mobility: Secondary | ICD-10-CM | POA: Diagnosis not present

## 2022-07-06 DIAGNOSIS — M25572 Pain in left ankle and joints of left foot: Secondary | ICD-10-CM | POA: Diagnosis not present

## 2022-07-09 DIAGNOSIS — M25572 Pain in left ankle and joints of left foot: Secondary | ICD-10-CM | POA: Diagnosis not present

## 2022-07-09 DIAGNOSIS — R2689 Other abnormalities of gait and mobility: Secondary | ICD-10-CM | POA: Diagnosis not present

## 2022-07-20 DIAGNOSIS — H2513 Age-related nuclear cataract, bilateral: Secondary | ICD-10-CM | POA: Diagnosis not present

## 2022-08-01 ENCOUNTER — Other Ambulatory Visit: Payer: Self-pay | Admitting: Family Medicine

## 2022-08-01 NOTE — Telephone Encounter (Signed)
Filled on 04/16/22 #90 tabs/ 0 refills, last OV was a UTI on 04/13/22 but CPE was on 02/19/22

## 2022-08-07 ENCOUNTER — Ambulatory Visit (INDEPENDENT_AMBULATORY_CARE_PROVIDER_SITE_OTHER): Payer: Medicare Other | Admitting: Family Medicine

## 2022-08-07 ENCOUNTER — Encounter: Payer: Self-pay | Admitting: Family Medicine

## 2022-08-07 VITALS — BP 136/82 | HR 67 | Temp 97.8°F | Ht 62.25 in | Wt 182.1 lb

## 2022-08-07 DIAGNOSIS — R42 Dizziness and giddiness: Secondary | ICD-10-CM | POA: Diagnosis not present

## 2022-08-07 DIAGNOSIS — I10 Essential (primary) hypertension: Secondary | ICD-10-CM | POA: Diagnosis not present

## 2022-08-07 MED ORDER — FLUTICASONE PROPIONATE 50 MCG/ACT NA SUSP: 2.0000 | Freq: Every day | NASAL | 6 refills | Status: DC

## 2022-08-07 NOTE — Assessment & Plan Note (Signed)
Episodic and brief/ usually mild Not positional  No headache or assoc symptoms  Some bilateral nystagmus on exam today -otherwise nl exam  Migraines are well controlled   Disc poss ETD /allergy cause  Will tty flonase for 1-2 weeks Also inc hydration if able  If not improved consider further w/u incl imaging  Er precautions noted

## 2022-08-07 NOTE — Progress Notes (Signed)
Subjective:    Patient ID: Amy Mills, female    DOB: Jun 15, 1950, 72 y.o.   MRN: [id number]  HPI Pt presents with c/o dizzy spells  Wt Readings from Last 3 Encounters:  08/07/22 182 lb 2 oz (82.6 kg)  04/13/22 177 lb 9.6 oz (80.6 kg)  03/20/22 175 lb (79.4 kg)   33.04 kg/m  Vitals:   08/07/22 0935  BP: 136/82  Pulse: 67  Temp: 97.8 F (36.6 C)  SpO2: 98%    Episodic dizziness 3-4 weeks ago  Feels like her head is in slow motion / swimmy   Usually last 30-40 seconds  No falls  Usually sitting Not positional   No head trauma   Not spinning More light headed   One episode when driving - lasted about a minute   Not a lot of allergy symptoms  No congestion or sneezing Ears feel fine   Takes diclofenac for joint pain - for years    HTN bp is stable today  No cp or palpitations or headaches or edema  No side effects to medicines  BP Readings from Last 3 Encounters:  08/07/22 136/82  04/13/22 130/78  02/19/22 126/62   Atenolol 25 mg daily   Pulse Readings from Last 3 Encounters:  08/07/22 67  04/13/22 64  02/19/22 95     Last metabolic panel Lab Results  Component Value Date   GLUCOSE 87 02/12/2022   NA 143 02/12/2022   K 3.9 02/12/2022   CL 108 02/12/2022   CO2 23 02/12/2022   BUN 16 02/12/2022   CREATININE 1.05 02/12/2022   CALCIUM 9.0 02/12/2022   PROT 5.5 (L) 02/12/2022   ALBUMIN 3.7 02/12/2022   BILITOT 0.4 02/12/2022   ALKPHOS 110 02/12/2022   AST 17 02/12/2022   ALT 19 02/12/2022   Lab Results  Component Value Date   WBC 20.6 Repeated and verified X2. (HH) 02/12/2022   HGB 9.4 (L) 02/12/2022   HCT 28.5 (L) 02/12/2022   MCV 94.8 02/12/2022   PLT 211.0 02/12/2022    Cbc in feb was back to normal with wbc 6.4  and Hb of 12.6   Lab Results  Component Value Date   TSH 1.66 02/12/2022   Lab Results  Component Value Date   HGBA1C 6.5 02/12/2022    Not a lot of recent migraines Imitrex helps  Has aura   Drinking  lots of water   Patient Active Problem List   Diagnosis Date Noted   Dizziness 08/07/2022   Onychomycosis 04/13/2022   Urine frequency 02/19/2022   History of breast cancer 09/12/2021   Colon cancer screening 12/12/2020   Medicare annual wellness visit, subsequent 08/28/2017   Estrogen deficiency 08/28/2017   Prediabetes 06/13/2015   Obesity 06/13/2015   Routine general medical examination at a health care facility 09/14/2011   STRESS REACTION, ACUTE, WITH EMOTIONAL DISTURBANCE 05/12/2008   HYPERCHOLESTEROLEMIA 04/21/2007   Essential hypertension 04/21/2007   UTI'S, RECURRENT 04/21/2007   FIBROMYALGIA 04/21/2007   RETRACTION/LAG, EYELID 09/20/2006   PARESTHESIA 09/20/2006   MRI, BRAIN, ABNORMAL 09/20/2006   Past Medical History:  Diagnosis Date   Anxiety reaction    Back pain    Fibromyalgia    History of recurrent UTIs    HLD (hyperlipidemia)    Hypertension    Past Surgical History:  Procedure Laterality Date   ABDOMINAL HYSTERECTOMY     OVARIAN CYST REMOVAL     TONSILLECTOMY     Social History  Tobacco Use   Smoking status: Never   Smokeless tobacco: Never  Vaping Use   Vaping Use: Never used  Substance Use Topics   Alcohol use: Yes    Alcohol/week: 0.0 standard drinks of alcohol    Comment: rare   Drug use: No   Family History  Problem Relation Age of Onset   Thyroid nodules Mother    Heart disease Mother    Hypertension Father    Dementia Father    Alcohol abuse Brother    Hyperlipidemia Daughter    Hypertension Daughter    Cancer Maternal Aunt        breast   Breast cancer Maternal Aunt    Cancer Paternal Aunt        breast   Breast cancer Paternal Aunt    Allergies  Allergen Reactions   Amoxicillin-Pot Clavulanate Diarrhea    Diarrhea    Doxycycline Other (See Comments)    vomiting   Nitrofurantoin     REACTION: hives   Current Outpatient Medications on File Prior to Visit  Medication Sig Dispense Refill   atenolol (TENORMIN) 25 MG  tablet Take 1 tablet (25 mg total) by mouth daily. 90 tablet 3   Cholecalciferol (D3-1000 PO) Take 1 tablet by mouth daily.     Diclofenac Sodium CR 100 MG 24 hr tablet TAKE ONE TABLET BY MOUTH ONCE DAILY AS NEEDED. TAKE WITH FOOD 90 tablet 0   Multiple Vitamins-Minerals (WOMENS MULTI PO) Take 1 tablet by mouth daily.       rosuvastatin (CRESTOR) 5 MG tablet TAKE 1 TABLET BY MOUTH ONCE A DAY 90 tablet 3   SUMAtriptan (IMITREX) 50 MG tablet TAKE 1 TABLET EVERY 2 HOURS AS NEEDED FOR MIGRAINE--MAX 2 DOSES PER DAY 10 tablet 11   trimethoprim (TRIMPEX) 100 MG tablet Take 1 tablet (100 mg total) by mouth daily. 90 tablet 3   No current facility-administered medications on file prior to visit.    Review of Systems  Constitutional:  Positive for fatigue. Negative for activity change, appetite change, fever and unexpected weight change.  HENT:  Negative for congestion, ear pain, rhinorrhea, sinus pressure and sore throat.   Eyes:  Negative for pain, redness and visual disturbance.  Respiratory:  Negative for cough, shortness of breath and wheezing.   Cardiovascular:  Negative for chest pain and palpitations.  Gastrointestinal:  Negative for abdominal pain, blood in stool, constipation and diarrhea.  Endocrine: Negative for polydipsia and polyuria.  Genitourinary:  Negative for dysuria, frequency and urgency.  Musculoskeletal:  Negative for arthralgias, back pain and myalgias.  Skin:  Negative for pallor and rash.  Allergic/Immunologic: Negative for environmental allergies.  Neurological:  Positive for light-headedness. Negative for dizziness, tremors, seizures, syncope, facial asymmetry, speech difficulty, weakness and numbness.  Hematological:  Negative for adenopathy. Does not bruise/bleed easily.  Psychiatric/Behavioral:  Negative for decreased concentration and dysphoric mood. The patient is not nervous/anxious.        Objective:   Physical Exam Constitutional:      General: She is not in  acute distress.    Appearance: Normal appearance. She is well-developed. She is obese. She is not ill-appearing or diaphoretic.  HENT:     Head: Normocephalic and atraumatic.     Right Ear: Tympanic membrane, ear canal and external ear normal.     Left Ear: Tympanic membrane, ear canal and external ear normal.     Nose: Nose normal.     Comments: Boggy nares  Mouth/Throat:     Mouth: Mucous membranes are moist.     Pharynx: No oropharyngeal exudate or posterior oropharyngeal erythema.     Comments: Scant clear pnd  Eyes:     General: No scleral icterus.       Right eye: No discharge.        Left eye: No discharge.     Conjunctiva/sclera: Conjunctivae normal.     Pupils: Pupils are equal, round, and reactive to light.     Comments: No nystagmus  Neck:     Thyroid: No thyromegaly.     Vascular: No carotid bruit or JVD.     Trachea: No tracheal deviation.  Cardiovascular:     Rate and Rhythm: Normal rate and regular rhythm.     Heart sounds: Normal heart sounds. No murmur heard. Pulmonary:     Effort: Pulmonary effort is normal. No respiratory distress.     Breath sounds: Normal breath sounds. No stridor. No wheezing, rhonchi or rales.  Abdominal:     General: Bowel sounds are normal. There is no distension.     Palpations: Abdomen is soft. There is no mass.     Tenderness: There is no abdominal tenderness.  Musculoskeletal:     Cervical back: Full passive range of motion without pain, normal range of motion and neck supple. No tenderness.  Lymphadenopathy:     Cervical: No cervical adenopathy.  Skin:    General: Skin is warm and dry.     Coloration: Skin is not pale.     Findings: No rash.  Neurological:     Mental Status: She is alert and oriented to person, place, and time.     Cranial Nerves: Cranial nerves 2-12 are intact. No cranial nerve deficit, dysarthria or facial asymmetry.     Sensory: No sensory deficit.     Motor: Motor function is intact. No weakness,  tremor, atrophy, abnormal muscle tone or pronator drift.     Coordination: Romberg sign negative. Coordination normal. Finger-Nose-Finger Test and Heel to [facility] Test normal.     Gait: Gait is intact. Gait normal.     Deep Tendon Reflexes: Reflexes are normal and symmetric. Reflexes normal.     Comments: No focal cerebellar signs   Psychiatric:        Behavior: Behavior normal.        Thought Content: Thought content normal.           Assessment & Plan:   Problem List Items Addressed This Visit       Cardiovascular and Mediastinum   Essential hypertension    bp in fair control at this time  BP Readings from Last 1 Encounters:  08/07/22 136/82  No changes needed Most recent labs reviewed  Disc lifstyle change with low sodium diet and exercise  Plan to continue tenormin 25 mg daily   No hypotension or positional change to explain intermittent dizziness        Other   Dizziness - Primary    Episodic and brief/ usually mild Not positional  No headache or assoc symptoms  Some bilateral nystagmus on exam today -otherwise nl exam  Migraines are well controlled   Disc poss ETD /allergy cause  Will tty flonase for 1-2 weeks Also inc hydration if able  If not improved consider further w/u incl imaging  Er precautions noted

## 2022-08-07 NOTE — Assessment & Plan Note (Signed)
bp in fair control at this time  BP Readings from Last 1 Encounters:  08/07/22 136/82   No changes needed Most recent labs reviewed  Disc lifstyle change with low sodium diet and exercise  Plan to continue tenormin 25 mg daily   No hypotension or positional change to explain intermittent dizziness

## 2022-08-07 NOTE — Patient Instructions (Signed)
Start flonase nasal spray daily  If dizziness is not much improved in 1-2 weeks let us know  Drink lots of fluids Monitor your headaches  Watch your blood pressure for highs or lows  If pulse gets below 50 let us knwo   Keep [place] posted   Exam today is reassuring

## 2022-09-05 ENCOUNTER — Telehealth (INDEPENDENT_AMBULATORY_CARE_PROVIDER_SITE_OTHER): Payer: Medicare Other | Admitting: Family Medicine

## 2022-09-05 ENCOUNTER — Encounter: Payer: Self-pay | Admitting: Family Medicine

## 2022-09-05 ENCOUNTER — Encounter: Payer: Self-pay | Admitting: *Deleted

## 2022-09-05 DIAGNOSIS — I10 Essential (primary) hypertension: Secondary | ICD-10-CM

## 2022-09-05 DIAGNOSIS — G4489 Other headache syndrome: Secondary | ICD-10-CM

## 2022-09-05 DIAGNOSIS — R42 Dizziness and giddiness: Secondary | ICD-10-CM

## 2022-09-05 DIAGNOSIS — R519 Headache, unspecified: Secondary | ICD-10-CM | POA: Insufficient documentation

## 2022-09-05 NOTE — Assessment & Plan Note (Signed)
Headaches are new- daily upon awakening and on/off through the day No aura  Not as severe in intensity as her usual migraines  Imitrex helped a little Tylenol helps a little  Instructed to avoid other nsaid with the daily diclofenac   This in combo with dizziness- recommend MRI if we can get that covered

## 2022-09-05 NOTE — Patient Instructions (Addendum)
I want to order an MRI of your brain if we can get it covered I put the referral in  Please let us know if you don't hear in 1-2 weeks   If symptoms worsen let us know If severe go to the ER Stay well hydrated  Avoid excess caffeine   Don't mix diclofenac with other over the counter nsaids

## 2022-09-05 NOTE — Progress Notes (Signed)
Virtual Visit via Video Note  I connected with Amy Mills on 09/05/22 at 10:30 AM EDT by a video enabled telemedicine application and verified that I am speaking with the correct person using two identifiers.  Location: Patient: home Provider: office    I discussed the limitations of evaluation and management by telemedicine and the availability of in person appointments. The patient expressed understanding and agreed to proceed.  Parties involved in encounter  Patient: Amy Mills   Provider:  Roxy Manns MD   History of Present Illness: Pt presents for f/u of dizziness    Last visit 08/07/22 Discussed episodic /brief/mild episodes of dizziness (not positional and no headaches or assoc symptoms) Some nystagmus noted Discussed poss ETD Px flonase and update   Now dizziness continues  Varies day to day  Some days more than others   Still a swimmy feeling  Feels deep inside of her head  She does not loose balance  No falls  No head trauma   No congestion  Breathing fine   Some tylenol  Some advil  Imitrex helps temporarily    Now has new headaches (no aura like her typical migraine)  Migraine like but not as severe  Waking up with them / comes and goes all day long   No triggers noted  No susp of sleep apnea   Bp was 136/82 last time  Takes tenormin 25 mg daily  Denied hypotension last time   No other neuro changes   Bp has been up at home  Taking in am before work- 150s  (right after she takes atenolol)    Pulse - ? Unsure    Diclofenac for joint pain- for years   Last metabolic panel Lab Results  Component Value Date   GLUCOSE 87 02/12/2022   NA 143 02/12/2022   K 3.9 02/12/2022   CL 108 02/12/2022   CO2 23 02/12/2022   BUN 16 02/12/2022   CREATININE 1.05 02/12/2022   CALCIUM 9.0 02/12/2022   PROT 5.5 (L) 02/12/2022   ALBUMIN 3.7 02/12/2022   BILITOT 0.4 02/12/2022   ALKPHOS 110 02/12/2022   AST 17 02/12/2022   ALT 19 02/12/2022   Lab  Results  Component Value Date   WBC 20.6 Repeated and verified X2. (HH) 02/12/2022   HGB 9.4 (L) 02/12/2022   HCT 28.5 (L) 02/12/2022   MCV 94.8 02/12/2022   PLT 211.0 02/12/2022   Lab Results  Component Value Date   TSH 1.66 02/12/2022    Had labs done at [facility]   Feb 8th  Wbc 6.4 Hb 12.6 Cmp normal  GFR was 79    H/o aura with migraine- imitrex prn  Last MRI brain wa 08  at [facility]  Noted:  IMPRESSION:  1. Slightly greater than expected number of punctate T2 and FLAIR hyperintensities in subcortical white matter bilaterally. This is nonspecific and can be seen in the setting of chronic migraine headaches, prior infection or inflammation, demyelinating process such as MS, or vasculitis. There is no enhancement associated with any of these lesions.   Patient Active Problem List   Diagnosis Date Noted   Headache 09/05/2022   Dizziness 08/07/2022   Onychomycosis 04/13/2022   Urine frequency 02/19/2022   History of breast cancer 09/12/2021   Colon cancer screening 12/12/2020   Medicare annual wellness visit, subsequent 08/28/2017   Estrogen deficiency 08/28/2017   Prediabetes 06/13/2015   Obesity 06/13/2015   Routine general medical examination at a health care  facility 09/14/2011   STRESS REACTION, ACUTE, WITH EMOTIONAL DISTURBANCE 05/12/2008   HYPERCHOLESTEROLEMIA 04/21/2007   Essential hypertension 04/21/2007   UTI'S, RECURRENT 04/21/2007   FIBROMYALGIA 04/21/2007   RETRACTION/LAG, EYELID 09/20/2006   PARESTHESIA 09/20/2006   MRI, BRAIN, ABNORMAL 09/20/2006   Past Medical History:  Diagnosis Date   Anxiety reaction    Back pain    Fibromyalgia    History of recurrent UTIs    HLD (hyperlipidemia)    Hypertension    Past Surgical History:  Procedure Laterality Date   ABDOMINAL HYSTERECTOMY     OVARIAN CYST REMOVAL     TONSILLECTOMY     Social History   Tobacco Use   Smoking status: Never   Smokeless tobacco: Never  Vaping Use   Vaping Use:  Never used  Substance Use Topics   Alcohol use: Yes    Alcohol/week: 0.0 standard drinks of alcohol    Comment: rare   Drug use: No   Family History  Problem Relation Age of Onset   Thyroid nodules Mother    Heart disease Mother    Hypertension Father    Dementia Father    Alcohol abuse Brother    Hyperlipidemia Daughter    Hypertension Daughter    Cancer Maternal Aunt        breast   Breast cancer Maternal Aunt    Cancer Paternal Aunt        breast   Breast cancer Paternal Aunt    Allergies  Allergen Reactions   Amoxicillin-Pot Clavulanate Diarrhea    Diarrhea    Doxycycline Other (See Comments)    vomiting   Nitrofurantoin     REACTION: hives   Current Outpatient Medications on File Prior to Visit  Medication Sig Dispense Refill   atenolol (TENORMIN) 25 MG tablet Take 1 tablet (25 mg total) by mouth daily. 90 tablet 3   Cholecalciferol (D3-1000 PO) Take 1 tablet by mouth daily.     Diclofenac Sodium CR 100 MG 24 hr tablet TAKE ONE TABLET BY MOUTH ONCE DAILY AS NEEDED. TAKE WITH FOOD 90 tablet 0   Multiple Vitamins-Minerals (WOMENS MULTI PO) Take 1 tablet by mouth daily.       rosuvastatin (CRESTOR) 5 MG tablet TAKE 1 TABLET BY MOUTH ONCE A DAY 90 tablet 3   SUMAtriptan (IMITREX) 50 MG tablet TAKE 1 TABLET EVERY 2 HOURS AS NEEDED FOR MIGRAINE--MAX 2 DOSES PER DAY 10 tablet 11   trimethoprim (TRIMPEX) 100 MG tablet Take 1 tablet (100 mg total) by mouth daily. 90 tablet 3   No current facility-administered medications on file prior to visit.   Review of Systems  Constitutional:  Positive for malaise/fatigue. Negative for chills and fever.  HENT:  Negative for congestion, ear pain, sinus pain and sore throat.   Eyes:  Negative for blurred vision, discharge and redness.  Respiratory:  Negative for cough, shortness of breath and stridor.   Cardiovascular:  Negative for chest pain, palpitations and leg swelling.  Gastrointestinal:  Negative for abdominal pain, diarrhea,  nausea and vomiting.  Musculoskeletal:  Negative for myalgias.  Skin:  Negative for rash.  Neurological:  Positive for dizziness and headaches. Negative for tingling, sensory change, speech change, focal weakness, seizures, loss of consciousness and weakness.      Observations/Objective:  Patient appears well, in no distress Weight is baseline  No facial swelling or asymmetry   (no obv CN palsy or change) Normal voice-not hoarse and no slurred speech No obvious tremor or  mobility impairment Moving neck and UEs normally Able to hear the call well  No cough or shortness of breath during interview  Talkative and mentally sharp with no cognitive changes No skin changes on face or neck , no rash or pallor Affect is normal   Assessment and Plan: Problem List Items Addressed This Visit       Cardiovascular and Mediastinum   Essential hypertension    Bp is up at home lately  150s systolic Unsure if this is from headache or causing  Continues atenolol 25 mg daily   Will continue to monitor as we work up the headaches and dizziness         Other   Dizziness    This persists  On/off  Again brief  but now also has new headaches  No problems with gait  No facial droop or vision change or stroke symptoms  No improvement with flonase or allergen avoidance  Did have some nystagmus last time   Given age and also recent h/o breast cancer and new headaches I would like to get imaging  Ultimately MRI if ins covers   Discussed poss of vertiginous migraine vs other cause  Encouraged good hydraton        Headache - Primary    Headaches are new- daily upon awakening and on/off through the day No aura  Not as severe in intensity as her usual migraines  Imitrex helped a little Tylenol helps a little  Instructed to avoid other nsaid with the daily diclofenac   This in combo with dizziness- recommend MRI if we can get that covered          Follow Up Instructions: I want to  order an MRI of your brain if we can get it covered I put the referral in  Please let us know if you don't hear in 1-2 weeks   If symptoms worsen let us know If severe go to the ER Stay well hydrated  Avoid excess caffeine   Don't mix diclofenac with other over the counter nsaids    I discussed the assessment and treatment plan with the patient. The patient was provided an opportunity to ask questions and all were answered. The patient agreed with the plan and demonstrated an understanding of the instructions.   The patient was advised to call back or seek an in-person evaluation if the symptoms worsen or if the condition fails to improve as anticipated.     Roxy Manns, MD

## 2022-09-05 NOTE — Assessment & Plan Note (Signed)
This persists  On/off  Again brief  but now also has new headaches  No problems with gait  No facial droop or vision change or stroke symptoms  No improvement with flonase or allergen avoidance  Did have some nystagmus last time   Given age and also recent h/o breast cancer and new headaches I would like to get imaging  Ultimately MRI if ins covers   Discussed poss of vertiginous migraine vs other cause  Encouraged good hydraton

## 2022-09-05 NOTE — Assessment & Plan Note (Signed)
Bp is up at home lately  150s systolic Unsure if this is from headache or causing  Continues atenolol 25 mg daily   Will continue to monitor as we work up the headaches and dizziness

## 2022-09-12 ENCOUNTER — Ambulatory Visit
Admission: RE | Admit: 2022-09-12 | Discharge: 2022-09-12 | Disposition: A | Discharge status: Home / Self Care | Payer: Medicare Other | Source: Ambulatory Visit | Attending: Family Medicine | Admitting: Family Medicine

## 2022-09-12 DIAGNOSIS — R42 Dizziness and giddiness: Secondary | ICD-10-CM

## 2022-09-12 DIAGNOSIS — R519 Headache, unspecified: Secondary | ICD-10-CM | POA: Diagnosis not present

## 2022-09-12 DIAGNOSIS — G4489 Other headache syndrome: Secondary | ICD-10-CM

## 2022-09-12 DIAGNOSIS — Z853 Personal history of malignant neoplasm of breast: Secondary | ICD-10-CM | POA: Diagnosis not present

## 2022-09-12 MED ORDER — GADOPICLENOL 0.5 MMOL/ML IV SOLN
9.0000 mL | Freq: Once | INTRAVENOUS | Status: AC | PRN
  Administered 2022-09-12: 9 mL via INTRAVENOUS

## 2022-09-14 ENCOUNTER — Ambulatory Visit (INDEPENDENT_AMBULATORY_CARE_PROVIDER_SITE_OTHER): Payer: Medicare Other | Admitting: Family Medicine

## 2022-09-14 ENCOUNTER — Encounter: Payer: Self-pay | Admitting: Family Medicine

## 2022-09-14 VITALS — BP 144/82 | HR 67 | Temp 97.8°F | Ht 62.25 in | Wt 185.4 lb

## 2022-09-14 DIAGNOSIS — I10 Essential (primary) hypertension: Secondary | ICD-10-CM

## 2022-09-14 DIAGNOSIS — E7800 Pure hypercholesterolemia, unspecified: Secondary | ICD-10-CM | POA: Diagnosis not present

## 2022-09-14 DIAGNOSIS — G4489 Other headache syndrome: Secondary | ICD-10-CM | POA: Diagnosis not present

## 2022-09-14 DIAGNOSIS — R42 Dizziness and giddiness: Secondary | ICD-10-CM | POA: Diagnosis not present

## 2022-09-14 DIAGNOSIS — Z8673 Personal history of transient ischemic attack (TIA), and cerebral infarction without residual deficits: Secondary | ICD-10-CM

## 2022-09-14 DIAGNOSIS — R002 Palpitations: Secondary | ICD-10-CM | POA: Insufficient documentation

## 2022-09-14 MED ORDER — LOSARTAN POTASSIUM 50 MG PO TABS: 50.0000 mg | ORAL_TABLET | Freq: Every day | ORAL | 2 refills | Status: DC

## 2022-09-14 NOTE — Patient Instructions (Addendum)
Continue 81 mg aspirin daily  Stay off the anti inflammatory medicines (topical voltaren gel is ok)  EKG today   We need to control blood pressure better  Start losartan 50 mg daily (alert [place] if any side effects) You can continue the atenolol   I placed a referral for  Neurology  Echocardiogram Carotid ultrasound   I put the referral in  Please let us know if you don't hear in 1-2 weeks   Follow up with me for blood pressure in about 2 weeks with your cuff   If any signs of stroke - call 911 Speech problems Weakness Facial droop  Vision change Severe headache  Worse dizziness
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
## 2022-09-16 NOTE — Assessment & Plan Note (Signed)
Intermittent / milder than her usual migraine  Corresponds with dizziness with cerebellar cva   Will add losartan to lower bp  Avoid nsaids  Neuro ref  Instructed to call if worse and ER precautions noted in detail

## 2022-09-16 NOTE — Assessment & Plan Note (Signed)
Bp is not at goal in setting of chronic cerebellar cva  (seen recently , unsure of age)  Continues atenolol 25 mg for this and headache Today add losartan 50 mg daily  Instructed to call if side effects or s/s of hypotension Follow up in approx 2 weeks

## 2022-09-16 NOTE — Assessment & Plan Note (Signed)
This continues / at times positional along with intermittent headache (mild)  Recent chronic L sided cerebellar infarct noted on MRI   See a/p for CVA Control bp /cholesterol  Work up for [employer] source with carotid US / echo  Also neuro ref  ER precautions noted in detail (see AVS)

## 2022-09-16 NOTE — Assessment & Plan Note (Addendum)
From time to time pt feels palpitations /not nec corresponding with dizziness  EKG today-sinus bradycardia rate of 59 Echo ordered Will likely need cardiology ref and monitor  ER precautions noted in detail

## 2022-09-16 NOTE — Assessment & Plan Note (Signed)
Disc goals for lipids and reasons to control them Rev last labs with pt Rev low sat fat diet in detail  Taking crestor 5 mg daily   LDL is in therapeutic range for s/o of CVA at 54
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
## 2022-09-18 ENCOUNTER — Ambulatory Visit (HOSPITAL_COMMUNITY)
Admission: RE | Admit: 2022-09-18 | Discharge: 2022-09-18 | Disposition: A | Discharge status: Home / Self Care | Payer: Medicare Other | Source: Ambulatory Visit | Attending: Family Medicine | Admitting: Family Medicine

## 2022-09-18 DIAGNOSIS — R002 Palpitations: Secondary | ICD-10-CM | POA: Diagnosis not present

## 2022-09-18 DIAGNOSIS — I10 Essential (primary) hypertension: Secondary | ICD-10-CM | POA: Insufficient documentation

## 2022-09-18 DIAGNOSIS — E785 Hyperlipidemia, unspecified: Secondary | ICD-10-CM | POA: Insufficient documentation

## 2022-09-18 DIAGNOSIS — Z8673 Personal history of transient ischemic attack (TIA), and cerebral infarction without residual deficits: Secondary | ICD-10-CM | POA: Insufficient documentation

## 2022-09-18 LAB — ECHOCARDIOGRAM COMPLETE
Area-P 1/2: 2.45 cm2
Calc EF: 54.1 %
S' Lateral: 3 cm
Single Plane A2C EF: 54 %
Single Plane A4C EF: 54.9 %

## 2022-09-18 NOTE — Progress Notes (Signed)
Echocardiogram 2D Echocardiogram has been performed.  Amy Mills 09/18/2022, 10:55 AM

## 2022-09-19 DIAGNOSIS — R519 Headache, unspecified: Secondary | ICD-10-CM | POA: Diagnosis not present

## 2022-09-19 DIAGNOSIS — Z8673 Personal history of transient ischemic attack (TIA), and cerebral infarction without residual deficits: Secondary | ICD-10-CM | POA: Diagnosis not present

## 2022-09-19 DIAGNOSIS — R42 Dizziness and giddiness: Secondary | ICD-10-CM | POA: Diagnosis not present

## 2022-09-19 DIAGNOSIS — R278 Other lack of coordination: Secondary | ICD-10-CM | POA: Diagnosis not present

## 2022-09-28 ENCOUNTER — Encounter: Payer: Self-pay | Admitting: Family Medicine

## 2022-09-28 ENCOUNTER — Ambulatory Visit (INDEPENDENT_AMBULATORY_CARE_PROVIDER_SITE_OTHER): Payer: Medicare Other | Admitting: Family Medicine

## 2022-09-28 VITALS — BP 118/66 | HR 69 | Temp 97.9°F | Ht 62.25 in | Wt 184.4 lb

## 2022-09-28 DIAGNOSIS — R42 Dizziness and giddiness: Secondary | ICD-10-CM

## 2022-09-28 DIAGNOSIS — R002 Palpitations: Secondary | ICD-10-CM

## 2022-09-28 DIAGNOSIS — Z8673 Personal history of transient ischemic attack (TIA), and cerebral infarction without residual deficits: Secondary | ICD-10-CM | POA: Diagnosis not present

## 2022-09-28 DIAGNOSIS — I10 Essential (primary) hypertension: Secondary | ICD-10-CM

## 2022-09-28 DIAGNOSIS — E7800 Pure hypercholesterolemia, unspecified: Secondary | ICD-10-CM

## 2022-09-28 DIAGNOSIS — G4489 Other headache syndrome: Secondary | ICD-10-CM | POA: Diagnosis not present

## 2022-09-28 MED ORDER — LOSARTAN POTASSIUM 50 MG PO TABS: 50.0000 mg | ORAL_TABLET | Freq: Every day | ORAL | 3 refills | Status: DC

## 2022-09-28 NOTE — Assessment & Plan Note (Signed)
With chronic appearing cerebellar stroke on MRI  Reviewed notes from neurology/ Dr Malvin Johns Carotid US pending   Consider vestib rehab later if needed This and headache are both significantly improved

## 2022-09-28 NOTE — Assessment & Plan Note (Signed)
Better with addn of losartan bp in fair control at this time  BP Readings from Last 1 Encounters:  09/28/22 118/66   No changes needed Most recent labs reviewed  Disc lifstyle change with low sodium diet and exercise  Continue losartan 50 mg daily and atenolol 25 mg daily  Headache is also improved

## 2022-09-28 NOTE — Progress Notes (Signed)
Subjective:    Patient ID: Amy Mills, female    DOB: [birth year], 72 y.o.   MRN: [id number]  HPI Pt presents for f/u of HTN  Wt Readings from Last 3 Encounters:  09/28/22 184 lb 6 oz (83.6 kg)  09/14/22 185 lb 6 oz (84.1 kg)  08/07/22 182 lb 2 oz (82.6 kg)   33.45 kg/m  Vitals:   09/28/22 1457  BP: 118/66  Pulse: 69  Temp: 97.9 F (36.6 C)  SpO2: 95%     HTN bp is stable today  No cp or palpitations or headaches or edema  No side effects to medicines  BP Readings from Last 3 Encounters:  09/28/22 118/66  09/14/22 (!) 144/82  08/07/22 136/82     Last visit we added losartan 50 mg daily -tolerating well  Continues atenolol 25 mg daily   Better bp does tend to help headache /less frequent and intense   Had neuro visit for CVA and headache with Dr Malvin Johns  Encouraged to continue asa and rosuvastatin  Recommended cardiac monitor  Considered vestibular rehab   Has carotid US scheduled for June   Ordered emg of legs   For headache was px medrol dosepack Also effexor 37.5 mg   Dizziness is improved/ more mild      Chemistry      Component Value Date/Time   NA 143 02/12/2022 0816   K 3.9 02/12/2022 0816   CL 108 02/12/2022 0816   CO2 23 02/12/2022 0816   BUN 24 (A) 06/14/2022 0000   CREATININE 0.8 06/14/2022 0000   CREATININE 1.05 02/12/2022 0816   CREATININE 0.77 06/09/2015 0823      Component Value Date/Time   CALCIUM 9.0 02/12/2022 0816   ALKPHOS 110 02/12/2022 0816   AST 17 02/12/2022 0816   ALT 19 02/12/2022 0816   BILITOT 0.4 02/12/2022 0816       Patient Active Problem List   Diagnosis Date Noted   Palpitations 09/14/2022   Chronic cerebrovascular accident (CVA) 09/14/2022   Headache 09/05/2022   Dizziness 08/07/2022   Onychomycosis 04/13/2022   Urine frequency 02/19/2022   History of breast cancer 09/12/2021   Colon cancer screening 12/12/2020   Medicare annual wellness visit, subsequent 08/28/2017   Estrogen deficiency  08/28/2017   Prediabetes 06/13/2015   Obesity 06/13/2015   Routine general medical examination at a health care facility 09/14/2011   STRESS REACTION, ACUTE, WITH EMOTIONAL DISTURBANCE 05/12/2008   HYPERCHOLESTEROLEMIA 04/21/2007   Essential hypertension 04/21/2007   UTI'S, RECURRENT 04/21/2007   FIBROMYALGIA 04/21/2007   RETRACTION/LAG, EYELID 09/20/2006   PARESTHESIA 09/20/2006   MRI, BRAIN, ABNORMAL 09/20/2006   Past Medical History:  Diagnosis Date   Anxiety reaction    Back pain    Fibromyalgia    History of recurrent UTIs    HLD (hyperlipidemia)    Hypertension    Past Surgical History:  Procedure Laterality Date   ABDOMINAL HYSTERECTOMY     OVARIAN CYST REMOVAL     TONSILLECTOMY     Social History   Tobacco Use   Smoking status: Never   Smokeless tobacco: Never  Vaping Use   Vaping Use: Never used  Substance Use Topics   Alcohol use: Yes    Alcohol/week: 0.0 standard drinks of alcohol    Comment: rare   Drug use: No   Family History  Problem Relation Age of Onset   Thyroid nodules Mother    Heart disease Mother    Hypertension Father  Dementia Father    Alcohol abuse Brother    Hyperlipidemia Daughter    Hypertension Daughter    Cancer Maternal Aunt        breast   Breast cancer Maternal Aunt    Cancer Paternal Aunt        breast   Breast cancer Paternal Aunt    Allergies  Allergen Reactions   Amoxicillin-Pot Clavulanate Diarrhea    Diarrhea    Doxycycline Other (See Comments)    vomiting   Nitrofurantoin     REACTION: hives   Current Outpatient Medications on File Prior to Visit  Medication Sig Dispense Refill   aspirin EC 81 MG tablet Take 81 mg by mouth daily. Swallow whole.     atenolol (TENORMIN) 25 MG tablet Take 1 tablet (25 mg total) by mouth daily. 90 tablet 3   Cholecalciferol (D3-1000 PO) Take 1 tablet by mouth daily.     Multiple Vitamins-Minerals (WOMENS MULTI PO) Take 1 tablet by mouth daily.       rosuvastatin (CRESTOR) 5  MG tablet TAKE 1 TABLET BY MOUTH ONCE A DAY 90 tablet 3   SUMAtriptan (IMITREX) 50 MG tablet TAKE 1 TABLET EVERY 2 HOURS AS NEEDED FOR MIGRAINE--MAX 2 DOSES PER DAY 10 tablet 11   trimethoprim (TRIMPEX) 100 MG tablet Take 1 tablet (100 mg total) by mouth daily. 90 tablet 3   venlafaxine (EFFEXOR) 37.5 MG tablet Take 37.5 mg by mouth daily.     No current facility-administered medications on file prior to visit.    Review of Systems  Constitutional:  Negative for activity change, appetite change, fatigue, fever and unexpected weight change.  HENT:  Negative for congestion, ear pain, rhinorrhea, sinus pressure and sore throat.   Eyes:  Negative for pain, redness and visual disturbance.  Respiratory:  Negative for cough, shortness of breath and wheezing.   Cardiovascular:  Negative for chest pain and palpitations.  Gastrointestinal:  Negative for abdominal pain, blood in stool, constipation and diarrhea.  Endocrine: Negative for polydipsia and polyuria.  Genitourinary:  Negative for dysuria, frequency and urgency.  Musculoskeletal:  Negative for arthralgias, back pain and myalgias.  Skin:  Negative for pallor and rash.  Allergic/Immunologic: Negative for environmental allergies.  Neurological:  Positive for dizziness and headaches. Negative for syncope and numbness.       Neuropathy is not bothering her   Hematological:  Negative for adenopathy. Does not bruise/bleed easily.  Psychiatric/Behavioral:  Negative for decreased concentration and dysphoric mood. The patient is not nervous/anxious.        Objective:   Physical Exam Constitutional:      General: She is not in acute distress.    Appearance: Normal appearance. She is well-developed. She is obese. She is not ill-appearing or diaphoretic.  HENT:     Head: Normocephalic and atraumatic.     Mouth/Throat:     Mouth: Mucous membranes are moist.  Eyes:     General: No scleral icterus.    Conjunctiva/sclera: Conjunctivae normal.      Pupils: Pupils are equal, round, and reactive to light.  Neck:     Thyroid: No thyromegaly.     Vascular: No carotid bruit or JVD.  Cardiovascular:     Rate and Rhythm: Normal rate and regular rhythm.     Heart sounds: Normal heart sounds.     No gallop.  Pulmonary:     Effort: Pulmonary effort is normal. No respiratory distress.     Breath sounds: Normal breath  sounds. No wheezing or rales.  Abdominal:     General: There is no distension or abdominal bruit.     Palpations: Abdomen is soft.  Musculoskeletal:     Cervical back: Normal range of motion and neck supple.     Right lower leg: No edema.     Left lower leg: No edema.  Lymphadenopathy:     Cervical: No cervical adenopathy.  Skin:    General: Skin is warm and dry.     Coloration: Skin is not pale.     Findings: No rash.  Neurological:     Mental Status: She is alert.     Cranial Nerves: No cranial nerve deficit.     Motor: No weakness.     Coordination: Coordination normal.     Gait: Gait normal.     Deep Tendon Reflexes: Reflexes are normal and symmetric. Reflexes normal.  Psychiatric:        Mood and Affect: Mood normal.           Assessment & Plan:   Problem List Items Addressed This Visit       Cardiovascular and Mediastinum   Chronic cerebrovascular accident (CVA)    Has seen neuro  Notes reviewed from Dr Malvin Johns  Has carotid dopplers upcoming  Encouraged her to go ahead with cardiac monitor  Echo was reassuring  Headache and dizziness improved Continues asa 81 mg daily  Bp improved with addn of cozaar  ER precautions again noted        Relevant Medications   losartan (COZAAR) 50 MG tablet   Essential hypertension - Primary    Better with addn of losartan bp in fair control at this time  BP Readings from Last 1 Encounters:  09/28/22 118/66  No changes needed Most recent labs reviewed  Disc lifstyle change with low sodium diet and exercise  Continue losartan 50 mg daily and atenolol 25 mg  daily  Headache is also improved       Relevant Medications   losartan (COZAAR) 50 MG tablet   Other Relevant Orders   Basic metabolic panel     Other   Dizziness    With chronic appearing cerebellar stroke on MRI  Reviewed notes from neurology/ Dr Malvin Johns Carotid US pending   Consider vestib rehab later if needed This and headache are both significantly improved      Headache    Improved with lower bp  Less dizzy also Reviewed recent neuro notes  Carotid dopplers pending in June   She plans to stop effexor  Continue to watch closely      Relevant Medications   venlafaxine (EFFEXOR) 37.5 MG tablet   HYPERCHOLESTEROLEMIA    Disc goals for lipids and reasons to control them Rev last labs with pt Rev low sat fat diet in detail  Taking crestor 5 mg daily   LDL is in therapeutic range for s/o of CVA at 54       Relevant Medications   losartan (COZAAR) 50 MG tablet   Palpitations    Encouraged pt to pursue cardiac monitor with neuro  Other option is cardiol referral Not symptomatic now

## 2022-09-28 NOTE — Assessment & Plan Note (Signed)
Improved with lower bp  Less dizzy also Reviewed recent neuro notes  Carotid dopplers pending in June   She plans to stop effexor  Continue to watch closely

## 2022-09-28 NOTE — Assessment & Plan Note (Signed)
Has seen neuro  Notes reviewed from Dr Malvin Johns  Has carotid dopplers upcoming  Encouraged her to go ahead with cardiac monitor  Echo was reassuring  Headache and dizziness improved Continues asa 81 mg daily  Bp improved with addn of cozaar  ER precautions again noted

## 2022-09-28 NOTE — Patient Instructions (Addendum)
If effexor makes you feel flat and headaches are improving anyway - call neurology and let them know you don't want to take it   I do recommend doing the cardiac monitor   Take care of yourself Blood pressure is improved Glad you are feeling better   Lab today

## 2022-09-28 NOTE — Assessment & Plan Note (Signed)
Encouraged pt to pursue cardiac monitor with neuro  Other option is cardiol referral Not symptomatic now

## 2022-09-28 NOTE — Assessment & Plan Note (Signed)
Disc goals for lipids and reasons to control them Rev last labs with pt Rev low sat fat diet in detail  Taking crestor 5 mg daily   LDL is in therapeutic range for s/o of CVA at 54  

## 2022-09-29 LAB — BASIC METABOLIC PANEL
BUN/Creatinine Ratio: 22 (ref 12–28)
BUN: 20 mg/dL (ref 8–27)
CO2: 25 mmol/L (ref 20–29)
Calcium: 9.8 mg/dL (ref 8.7–10.3)
Chloride: 100 mmol/L (ref 96–106)
Creatinine, Ser: 0.89 mg/dL (ref 0.57–1.00)
Glucose: 96 mg/dL (ref 70–99)
Potassium: 4.9 mmol/L (ref 3.5–5.2)
Sodium: 139 mmol/L (ref 134–144)
eGFR: 69 mL/min/{1.73_m2} (ref >59)

## 2022-10-03 DIAGNOSIS — Z853 Personal history of malignant neoplasm of breast: Secondary | ICD-10-CM | POA: Diagnosis not present

## 2022-10-03 DIAGNOSIS — C50311 Malignant neoplasm of lower-inner quadrant of right female breast: Secondary | ICD-10-CM | POA: Diagnosis not present

## 2022-10-03 DIAGNOSIS — Z08 Encounter for follow-up examination after completed treatment for malignant neoplasm: Secondary | ICD-10-CM | POA: Diagnosis not present

## 2022-10-03 DIAGNOSIS — R92323 Mammographic fibroglandular density, bilateral breasts: Secondary | ICD-10-CM | POA: Diagnosis not present

## 2022-10-03 DIAGNOSIS — Z923 Personal history of irradiation: Secondary | ICD-10-CM | POA: Diagnosis not present

## 2022-10-03 DIAGNOSIS — Z171 Estrogen receptor negative status [ER-]: Secondary | ICD-10-CM | POA: Diagnosis not present

## 2022-10-03 DIAGNOSIS — Z9889 Other specified postprocedural states: Secondary | ICD-10-CM | POA: Diagnosis not present

## 2022-10-03 LAB — HM MAMMOGRAPHY

## 2022-10-19 ENCOUNTER — Ambulatory Visit
Admission: RE | Admit: 2022-10-19 | Discharge: 2022-10-19 | Disposition: A | Discharge status: Home / Self Care | Payer: Medicare Other | Source: Ambulatory Visit | Attending: Family Medicine | Admitting: Family Medicine

## 2022-10-19 DIAGNOSIS — G4489 Other headache syndrome: Secondary | ICD-10-CM

## 2022-10-19 DIAGNOSIS — R42 Dizziness and giddiness: Secondary | ICD-10-CM

## 2022-10-19 DIAGNOSIS — I10 Essential (primary) hypertension: Secondary | ICD-10-CM

## 2022-10-19 DIAGNOSIS — Z8673 Personal history of transient ischemic attack (TIA), and cerebral infarction without residual deficits: Secondary | ICD-10-CM

## 2022-10-19 DIAGNOSIS — I6523 Occlusion and stenosis of bilateral carotid arteries: Secondary | ICD-10-CM | POA: Diagnosis not present

## 2022-10-22 ENCOUNTER — Telehealth: Payer: Self-pay | Admitting: Family Medicine

## 2022-10-22 DIAGNOSIS — E7800 Pure hypercholesterolemia, unspecified: Secondary | ICD-10-CM

## 2022-10-22 DIAGNOSIS — Z8673 Personal history of transient ischemic attack (TIA), and cerebral infarction without residual deficits: Secondary | ICD-10-CM

## 2022-10-22 DIAGNOSIS — I6529 Occlusion and stenosis of unspecified carotid artery: Secondary | ICD-10-CM | POA: Insufficient documentation

## 2022-10-22 DIAGNOSIS — I6523 Occlusion and stenosis of bilateral carotid arteries: Secondary | ICD-10-CM

## 2022-10-22 NOTE — Telephone Encounter (Signed)
-----   Message from Lonia Blood, [place] sent at 10/22/2022  3:52 PM EDT ----- Called patient and reviewed all information. Patient verbalized understanding. Will call if any further questions.  Patient would like referral placed to [facility]. Jens Som, MD.

## 2022-10-22 NOTE — Telephone Encounter (Signed)
I put the referral in  Please let us know if you don't hear in 1-2 weeks   

## 2022-11-07 DIAGNOSIS — R519 Headache, unspecified: Secondary | ICD-10-CM | POA: Diagnosis not present

## 2022-11-07 DIAGNOSIS — R278 Other lack of coordination: Secondary | ICD-10-CM | POA: Diagnosis not present

## 2022-11-07 DIAGNOSIS — Z8673 Personal history of transient ischemic attack (TIA), and cerebral infarction without residual deficits: Secondary | ICD-10-CM | POA: Diagnosis not present

## 2022-11-21 ENCOUNTER — Telehealth: Payer: Self-pay

## 2022-11-21 ENCOUNTER — Ambulatory Visit: Payer: Medicare Other

## 2022-11-21 VITALS — Ht 62.0 in | Wt 180.0 lb

## 2022-11-21 DIAGNOSIS — Z0000 Encounter for general adult medical examination without abnormal findings: Secondary | ICD-10-CM

## 2022-11-21 NOTE — Telephone Encounter (Signed)
error 

## 2022-11-21 NOTE — Progress Notes (Signed)
Subjective:   Amy Mills is a 72 y.o. female who presents for Medicare Annual (Subsequent) preventive examination.  Visit Complete: Virtual  I connected with  Amy Mills on 11/21/22 by a audio enabled telemedicine application and verified that I am speaking with the correct person using two identifiers.  Patient Location: Home  Provider Location: Office/Clinic  I discussed the limitations of evaluation and management by telemedicine. The patient expressed understanding and agreed to proceed.   Review of Systems      Cardiac Risk Factors include: advanced age (>55men, >21 women);obesity (BMI >30kg/m2);hypertension  Per patient no change in vitals since last visit, unable to obtain new vitals due to telehealth visit     Objective:    Today's Vitals   11/21/22 1401  Weight: 180 lb (81.6 kg)  Height: 5\' 2"  (1.575 m)  PainSc: 4    Body mass index is 32.92 kg/m.     11/21/2022    2:08 PM 12/15/2021    8:46 AM 01/28/2021   11:34 PM  Advanced Directives  Does Patient Have a Medical Advance Directive? No No No  Would patient like information on creating a medical advance directive? No - Patient declined  No - Patient declined    Current Medications (verified) Outpatient Encounter Medications as of 11/21/2022  Medication Sig   aspirin EC 81 MG tablet Take 81 mg by mouth daily. Swallow whole.   atenolol (TENORMIN) 25 MG tablet Take 1 tablet (25 mg total) by mouth daily.   Cholecalciferol (D3-1000 PO) Take 1 tablet by mouth daily.   losartan (COZAAR) 50 MG tablet Take 1 tablet (50 mg total) by mouth daily.   Multiple Vitamins-Minerals (WOMENS MULTI PO) Take 1 tablet by mouth daily.     rosuvastatin (CRESTOR) 5 MG tablet TAKE 1 TABLET BY MOUTH ONCE A DAY   SUMAtriptan (IMITREX) 50 MG tablet TAKE 1 TABLET EVERY 2 HOURS AS NEEDED FOR MIGRAINE--MAX 2 DOSES PER DAY   trimethoprim (TRIMPEX) 100 MG tablet Take 1 tablet (100 mg total) by mouth daily.   venlafaxine (EFFEXOR) 37.5 MG  tablet Take 37.5 mg by mouth daily. (Patient not taking: Reported on 11/21/2022)   No facility-administered encounter medications on file as of 11/21/2022.    Allergies (verified) Amoxicillin-pot clavulanate, Doxycycline, and Nitrofurantoin   History: Past Medical History:  Diagnosis Date   Anxiety reaction    Back pain    Fibromyalgia    History of recurrent UTIs    HLD (hyperlipidemia)    Hypertension    Past Surgical History:  Procedure Laterality Date   ABDOMINAL HYSTERECTOMY     OVARIAN CYST REMOVAL     TONSILLECTOMY     Family History  Problem Relation Age of Onset   Thyroid nodules Mother    Heart disease Mother    Hypertension Father    Dementia Father    Alcohol abuse Brother    Hyperlipidemia Daughter    Hypertension Daughter    Cancer Maternal Aunt        breast   Breast cancer Maternal Aunt    Cancer Paternal Aunt        breast   Breast cancer Paternal Aunt    Social History   Socioeconomic History   Marital status: Married    Spouse name: Not on file   Number of children: Not on file   Years of education: Not on file   Highest education level: Some college, no degree  Occupational History   Not  on file  Tobacco Use   Smoking status: Never   Smokeless tobacco: Never  Vaping Use   Vaping status: Never Used  Substance and Sexual Activity   Alcohol use: Yes    Alcohol/week: 0.0 standard drinks of alcohol    Comment: rare   Drug use: No   Sexual activity: Not on file  Other Topics Concern   Not on file  Social History Narrative   Not on file   Social Determinants of Health   Financial Resource Strain: Low Risk  (11/21/2022)   Overall Financial Resource Strain (CARDIA)    Difficulty of Paying Living Expenses: Not hard at all  Food Insecurity: No Food Insecurity (11/21/2022)   Hunger Vital Sign    Worried About Running Out of Food in the Last Year: Never true    Ran Out of Food in the Last Year: Never true  Transportation Needs: No  Transportation Needs (11/21/2022)   PRAPARE - Administrator, Civil Service (Medical): No    Lack of Transportation (Non-Medical): No  Physical Activity: Insufficiently Active (11/21/2022)   Exercise Vital Sign    Days of Exercise per Week: 3 days    Minutes of Exercise per Session: 20 min  Stress: No Stress Concern Present (11/21/2022)   [employer] of Occupational Health - Occupational Stress Questionnaire    Feeling of Stress : Not at all  Social Connections: Moderately Integrated (11/21/2022)   Social Connection and Isolation Panel [NHANES]    Frequency of Communication with Friends and Family: More than three times a week    Frequency of Social Gatherings with Friends and Family: More than three times a week    Attends Religious Services: More than 4 times per year    Active Member of [employer] or Organizations: No    Attends Engineer, structural: Never    Marital Status: Married    Tobacco Counseling Counseling given: Not Answered   Clinical Intake:  Pre-visit preparation completed: Yes  Pain : 0-10 Pain Score: 4  Pain Type: Chronic pain Pain Location: Generalized Pain Descriptors / Indicators: Aching Pain Onset: Other (comment) (years)     BMI - recorded: 32.92 Nutritional Status: BMI > 30  Obese Nutritional Risks: None Diabetes: No  How often do you need to have someone help you when you read instructions, pamphlets, or other written materials from your doctor or pharmacy?: 1 - Never  Interpreter Needed?: No  Information entered by :: C.Ulysses Alper LPN   Activities of Daily Living    11/21/2022    2:10 PM 12/15/2021    8:48 AM  In your present state of health, do you have any difficulty performing the following activities:  Hearing? 0 0  Vision? 0 0  Difficulty concentrating or making decisions? 0 0  Walking or climbing stairs? 0 0  Dressing or bathing? 0 0  Doing errands, shopping? 0 0  Preparing Food and eating ? N N  Using the  Toilet? N N  In the past six months, have you accidently leaked urine? N Y  Do you have problems with loss of bowel control? N N  Managing your Medications? N N  Managing your Finances? N N  Housekeeping or managing your Housekeeping? N N    Patient Care Team: Tower, Audrie Gallus, MD as PCP - General  Indicate any recent Medical Services you may have received from other than [facility] providers in the past year (date may be approximate).     Assessment:  This is a routine wellness examination for Charina.  Hearing/Vision screen Hearing Screening - Comments:: Denies hearing difficulties   Vision Screening - Comments:: Glasses and contacts - UTD on eye exams  - [facility]  Dietary issues and exercise activities discussed:     Goals Addressed             This Visit's Progress    Patient Stated       Lose 30 pounds.       Depression Screen    11/21/2022    2:06 PM 09/28/2022    3:14 PM 08/07/2022    9:43 AM 04/13/2022    2:45 PM 12/15/2021    8:47 AM 12/12/2020    9:30 AM 12/11/2019    9:43 AM  PHQ 2/9 Scores  PHQ - 2 Score 0 0 0 0 0 0 0  PHQ- 9 Score 0 0 1        Fall Risk    11/21/2022    2:09 PM 09/28/2022    3:13 PM 08/07/2022    9:42 AM 07/03/2022    1:58 PM 04/13/2022    2:44 PM  Fall Risk   Falls in the past year? 1 0 0 0 0  Comment Knocked down in yard by dog      Number falls in past yr: 0 0 0 0 0  Injury with Fall? 0 0 0 0 0  Comment bruised knee      Risk for fall due to : No Fall Risks No Fall Risks No Fall Risks No Fall Risks No Fall Risks  Follow up Falls prevention discussed;Falls evaluation completed Falls evaluation completed Falls evaluation completed Falls evaluation completed Falls evaluation completed    MEDICARE RISK AT HOME:  Medicare Risk at Home - 11/21/22 1410     Any stairs in or around the home? Yes    If so, are there any without handrails? No    Home free of loose throw rugs in walkways, pet beds, electrical cords, etc? Yes    Adequate  lighting in your home to reduce risk of falls? Yes    Life alert? No    Use of a cane, walker or w/c? No    Grab bars in the bathroom? No    Shower chair or bench in shower? Yes    Elevated toilet seat or a handicapped toilet? Yes             TIMED UP AND GO:  Was the test performed?  No    Cognitive Function:        11/21/2022    2:11 PM 12/15/2021    8:48 AM  6CIT Screen  What Year? 0 points 0 points  What month? 0 points 0 points  What time? 0 points 0 points  Count back from 20 0 points 0 points  Months in reverse 0 points 0 points  Repeat phrase 0 points 2 points  Total Score 0 points 2 points    Immunizations Immunization History  Administered Date(s) Administered   PFIZER(Purple Top)SARS-COV-2 Vaccination 06/01/2019, 06/22/2019   Pneumococcal Conjugate-13 08/28/2017   Td 05/07/1997, 05/07/2005    TDAP status: Due, Education has been provided regarding the importance of this vaccine. Advised may receive this vaccine at local pharmacy or Health Dept. Aware to provide a copy of the vaccination record if obtained from local pharmacy or Health Dept. Verbalized acceptance and understanding.  Flu Vaccine status: Declined, Education has been provided regarding the importance of  this vaccine but patient still declined. Advised may receive this vaccine at local pharmacy or Health Dept. Aware to provide a copy of the vaccination record if obtained from local pharmacy or Health Dept. Verbalized acceptance and understanding.  Pneumococcal vaccine status: Declined,  Education has been provided regarding the importance of this vaccine but patient still declined. Advised may receive this vaccine at local pharmacy or Health Dept. Aware to provide a copy of the vaccination record if obtained from local pharmacy or Health Dept. Verbalized acceptance and understanding.   Covid-19 vaccine status: Declined, Education has been provided regarding the importance of this vaccine but  patient still declined. Advised may receive this vaccine at local pharmacy or Health Dept.or vaccine clinic. Aware to provide a copy of the vaccination record if obtained from local pharmacy or Health Dept. Verbalized acceptance and understanding.  Qualifies for Shingles Vaccine? Yes   Zostavax completed  declined   Shingrix Completed?: No.    Education has been provided regarding the importance of this vaccine. Patient has been advised to call insurance company to determine out of pocket expense if they have not yet received this vaccine. Advised may also receive vaccine at local pharmacy or Health Dept. Verbalized acceptance and understanding.  Screening Tests Health Maintenance  Topic Date Due   DTaP/Tdap/Td (3 - Tdap) 05/08/2015   Pneumonia Vaccine 70+ Years old (2 of 2 - PPSV23 or PCV20) 08/29/2018   COVID-19 Vaccine (3 - Pfizer risk series) 07/20/2019   Colonoscopy  05/15/2020   MAMMOGRAM  08/09/2022   Medicare Annual Wellness (AWV)  12/16/2022   INFLUENZA VACCINE  12/12/2025 (Originally 12/06/2022)   Zoster Vaccines- Shingrix (1 of 2) 12/12/2025 (Originally 02/04/1970)   DEXA SCAN  Completed   Hepatitis C Screening  Completed   HPV VACCINES  Aged Out    Health Maintenance  Health Maintenance Due  Topic Date Due   DTaP/Tdap/Td (3 - Tdap) 05/08/2015   Pneumonia Vaccine 75+ Years old (2 of 2 - PPSV23 or PCV20) 08/29/2018   COVID-19 Vaccine (3 - Pfizer risk series) 07/20/2019   Colonoscopy  05/15/2020   MAMMOGRAM  08/09/2022   Medicare Annual Wellness (AWV)  12/16/2022    Colonoscopy- Patient declined stated she would consider it next year after cancer treatment completed.  Mammogram status: Completed 09/2022 per pt at [facility]. Repeat every year  Bone Density status: Completed 10/17/17. Results reflect: Bone density results: NORMAL. Repeat every 5 years. Declined at this time.  Lung Cancer Screening: (Low Dose CT Chest recommended if Age 47-80 years, 20 pack-year currently  smoking OR have quit w/in 15years.) does not qualify.   Lung Cancer Screening Referral: n/a  Additional Screening:  Hepatitis C Screening: does qualify; Completed 12/01/18  Vision Screening: Recommended annual ophthalmology exams for early detection of glaucoma and other disorders of the eye. Is the patient up to date with their annual eye exam?  Yes  Who is the provider or what is the name of the office in which the patient attends annual eye exams? [facility] If pt is not established with a provider, would they like to be referred to a provider to establish care? Yes .   Dental Screening: Recommended annual dental exams for proper oral hygiene    Community Resource Referral / Chronic Care Management: CRR required this visit?  No   CCM required this visit?  No     Plan:     I have personally reviewed and noted the following in the patient's chart:  Medical and social history Use of alcohol, tobacco or illicit drugs  Current medications and supplements including opioid prescriptions. Patient is not currently taking opioid prescriptions. Functional ability and status Nutritional status Physical activity Advanced directives List of other physicians Hospitalizations, surgeries, and ER visits in previous 12 months Vitals Screenings to include cognitive, depression, and falls Referrals and appointments  In addition, I have reviewed and discussed with patient certain preventive protocols, quality metrics, and best practice recommendations. A written personalized care plan for preventive services as well as general preventive health recommendations were provided to patient.     Maryan Puls, LPN   1/61/0960   After Visit Summary: (MyChart) Due to this being a telephonic visit, the after visit summary with patients personalized plan was offered to patient via MyChart   Nurse Notes: Vaccinations: declines all Influenza vaccine: recommend every Fall Pneumococcal vaccine:  recommend once per lifetime Prevnar-20 Tdap vaccine: recommend every 10 years Shingles vaccine: recommend Shingrix which is 2 doses 2-6 months apart and over 90% effective     Covid-19: recommend 2 doses one month apart with a booster 6 months later

## 2022-11-21 NOTE — Patient Instructions (Signed)
[name] , Thank you for taking time to come for your Medicare Wellness Visit. I appreciate your ongoing commitment to your health goals. Please review the following plan we discussed and let me know if I can assist you in the future.   These are the goals we discussed:  Goals      Patient Stated     12/15/2021, get through the chemo     Patient Stated     Lose 30 pounds.        This is a list of the screening recommended for you and due dates:  Health Maintenance  Topic Date Due   DTaP/Tdap/Td vaccine (3 - Tdap) 05/08/2015   Pneumonia Vaccine (2 of 2 - PPSV23 or PCV20) 08/29/2018   COVID-19 Vaccine (3 - Pfizer risk series) 07/20/2019   Colon Cancer Screening  05/15/2020   Mammogram  08/09/2022   Medicare Annual Wellness Visit  12/16/2022   Flu Shot  12/12/2025*   Zoster (Shingles) Vaccine (1 of 2) 12/12/2025*   DEXA scan (bone density measurement)  Completed   Hepatitis C Screening  Completed   HPV Vaccine  Aged Out  *Topic was postponed. The date shown is not the original due date.    Advanced directives: none  Conditions/risks identified: Aim for 30 minutes of exercise or brisk walking, 6-8 glasses of water, and 5 servings of fruits and vegetables each day.   Next appointment: Follow up in one year for your annual wellness visit 11/26/23 @ 1:00 televisit   Preventive Care 65 Years and Older, Female Preventive care refers to lifestyle choices and visits with your health care provider that can promote health and wellness. What does preventive care include? A yearly physical exam. This is also called an annual well check. Dental exams once or twice a year. Routine eye exams. Ask your health care provider how often you should have your eyes checked. Personal lifestyle choices, including: Daily care of your teeth and gums. Regular physical activity. Eating a healthy diet. Avoiding tobacco and drug use. Limiting alcohol use. Practicing safe sex. Taking low-dose aspirin  every day. Taking vitamin and mineral supplements as recommended by your health care provider. What happens during an annual well check? The services and screenings done by your health care provider during your annual well check will depend on your age, overall health, lifestyle risk factors, and family history of disease. Counseling  Your health care provider may ask you questions about your: Alcohol use. Tobacco use. Drug use. Emotional well-being. Home and relationship well-being. Sexual activity. Eating habits. History of falls. Memory and ability to understand (cognition). Work and work Astronomer. Reproductive health. Screening  You may have the following tests or measurements: Height, weight, and BMI. Blood pressure. Lipid and cholesterol levels. These may be checked every 5 years, or more frequently if you are over 26 years old. Skin check. Lung cancer screening. You may have this screening every year starting at age 73 if you have a 30-pack-year history of smoking and currently smoke or have quit within the past 15 years. Fecal occult blood test (FOBT) of the stool. You may have this test every year starting at age 60. Flexible sigmoidoscopy or colonoscopy. You may have a sigmoidoscopy every 5 years or a colonoscopy every 10 years starting at age 38. Hepatitis C blood test. Hepatitis B blood test. Sexually transmitted disease (STD) testing. Diabetes screening. This is done by checking your blood sugar (glucose) after you have not eaten for a while (fasting).  You may have this done every 1-3 years. Bone density scan. This is done to screen for osteoporosis. You may have this done starting at age 39. Mammogram. This may be done every 1-2 years. Talk to your health care provider about how often you should have regular mammograms. Talk with your health care provider about your test results, treatment options, and if necessary, the need for more tests. Vaccines  Your health  care provider may recommend certain vaccines, such as: Influenza vaccine. This is recommended every year. Tetanus, diphtheria, and acellular pertussis (Tdap, Td) vaccine. You may need a Td booster every 10 years. Zoster vaccine. You may need this after age 29. Pneumococcal 13-valent conjugate (PCV13) vaccine. One dose is recommended after age 16. Pneumococcal polysaccharide (PPSV23) vaccine. One dose is recommended after age 52. Talk to your health care provider about which screenings and vaccines you need and how often you need them. This information is not intended to replace advice given to you by your health care provider. Make sure you discuss any questions you have with your health care provider. Document Released: 05/20/2015 Document Revised: 01/11/2016 Document Reviewed: 02/22/2015 Elsevier Interactive Patient Education  2017 [employer].  Fall Prevention in the Home Falls can cause injuries. They can happen to people of all ages. There are many things you can do to make your home safe and to help prevent falls. What can I do on the outside of my home? Regularly fix the edges of walkways and driveways and fix any cracks. Remove anything that might make you trip as you walk through a door, such as a raised step or threshold. Trim any bushes or trees on the path to your home. Use bright outdoor lighting. Clear any walking paths of anything that might make someone trip, such as rocks or tools. Regularly check to see if handrails are loose or broken. Make sure that both sides of any steps have handrails. Any raised decks and porches should have guardrails on the edges. Have any leaves, snow, or ice cleared regularly. Use sand or salt on walking paths during winter. Clean up any spills in your garage right away. This includes oil or grease spills. What can I do in the bathroom? Use night lights. Install grab bars by the toilet and in the tub and shower. Do not use towel bars as grab  bars. Use non-skid mats or decals in the tub or shower. If you need to sit down in the shower, use a plastic, non-slip stool. Keep the floor dry. Clean up any water that spills on the floor as soon as it happens. Remove soap buildup in the tub or shower regularly. Attach bath mats securely with double-sided non-slip rug tape. Do not have throw rugs and other things on the floor that can make you trip. What can I do in the bedroom? Use night lights. Make sure that you have a light by your bed that is easy to reach. Do not use any sheets or blankets that are too big for your bed. They should not hang down onto the floor. Have a firm chair that has side arms. You can use this for support while you get dressed. Do not have throw rugs and other things on the floor that can make you trip. What can I do in the kitchen? Clean up any spills right away. Avoid walking on wet floors. Keep items that you use a lot in easy-to-reach places. If you need to reach something above you, use a  strong step stool that has a grab bar. Keep electrical cords out of the way. Do not use floor polish or wax that makes floors slippery. If you must use wax, use non-skid floor wax. Do not have throw rugs and other things on the floor that can make you trip. What can I do with my stairs? Do not leave any items on the stairs. Make sure that there are handrails on both sides of the stairs and use them. Fix handrails that are broken or loose. Make sure that handrails are as long as the stairways. Check any carpeting to make sure that it is firmly attached to the stairs. Fix any carpet that is loose or worn. Avoid having throw rugs at the top or bottom of the stairs. If you do have throw rugs, attach them to the floor with carpet tape. Make sure that you have a light switch at the top of the stairs and the bottom of the stairs. If you do not have them, ask someone to add them for you. What else can I do to help prevent  falls? Wear shoes that: Do not have high heels. Have rubber bottoms. Are comfortable and fit you well. Are closed at the toe. Do not wear sandals. If you use a stepladder: Make sure that it is fully opened. Do not climb a closed stepladder. Make sure that both sides of the stepladder are locked into place. Ask someone to hold it for you, if possible. Clearly mark and make sure that you can see: Any grab bars or handrails. First and last steps. Where the edge of each step is. Use tools that help you move around (mobility aids) if they are needed. These include: Canes. Walkers. Scooters. Crutches. Turn on the lights when you go into a dark area. Replace any light bulbs as soon as they burn out. Set up your furniture so you have a clear path. Avoid moving your furniture around. If any of your floors are uneven, fix them. If there are any pets around you, be aware of where they are. Review your medicines with your doctor. Some medicines can make you feel dizzy. This can increase your chance of falling. Ask your doctor what other things that you can do to help prevent falls. This information is not intended to replace advice given to you by your health care provider. Make sure you discuss any questions you have with your health care provider. Document Released: 02/17/2009 Document Revised: 09/29/2015 Document Reviewed: 05/28/2014 Elsevier Interactive Patient Education  2017 [employer].

## 2022-12-25 NOTE — Progress Notes (Signed)
Valda Lamb MD Reason for referral-cerebrovascular disease  HPI: 72 year old female for evaluation of cerebrovascular disease at request of Vinnie Langton MD.  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  Echocardiogram May 2024 showed normal LV function, grade 1 diastolic dysfunction.  Carotid Dopplers June 2024 showed minimal to moderate amount of atherosclerotic plaque left greater than right not resulting in hemodynamically significant stenosis in either internal carotid artery.  Patient seen by neurology and monitor recommended.  Patient has had occasional palpitations since she was 72 years old.  They are described as a flutter and can last several minutes at a time.  She typically has these 1-2 times weekly.  No associated symptoms.  This past spring she developed new onset episodes of dizziness.  There was no associated palpitations, dyspnea, chest pain or weakness/loss of sensation in her extremities.  This ultimately led to the MRI which showed a left cerebellar infarct.  Other workup was benign as outlined above.  Cardiology now asked to evaluate.  Current Outpatient Medications  Medication Sig Dispense Refill   aspirin EC 81 MG tablet Take 81 mg by mouth daily. Swallow whole.     atenolol (TENORMIN) 25 MG tablet Take 1 tablet (25 mg total) by mouth daily. 90 tablet 3   Cholecalciferol (D3-1000 PO) Take 1 tablet by mouth daily.     Cyanocobalamin (B-12 PO) Take 1 capsule by mouth daily at 6 (six) AM.     losartan (COZAAR) 50 MG tablet Take 1 tablet (50 mg total) by mouth daily. 90 tablet 3   Multiple Vitamins-Minerals (WOMENS MULTI PO) Take 1 tablet by mouth daily.       rosuvastatin (CRESTOR) 5 MG tablet TAKE 1 TABLET BY MOUTH ONCE A DAY 90 tablet 3   trimethoprim (TRIMPEX) 100 MG tablet Take 1 tablet (100 mg total) by mouth daily. 90 tablet 3   SUMAtriptan (IMITREX) 50 MG tablet TAKE 1 TABLET EVERY 2 HOURS AS NEEDED FOR MIGRAINE--MAX 2 DOSES PER DAY (Patient not  taking: Reported on 12/31/2022) 10 tablet 11   venlafaxine (EFFEXOR) 37.5 MG tablet Take 37.5 mg by mouth daily. (Patient not taking: Reported on 11/21/2022)     No current facility-administered medications for this visit.    Allergies  Allergen Reactions   Amoxicillin-Pot Clavulanate Diarrhea    Diarrhea    Doxycycline Other (See Comments)    vomiting   Nitrofurantoin     REACTION: hives     Past Medical History:  Diagnosis Date   Anxiety reaction    Back pain    Breast cancer (HCC)    CVA (cerebrovascular accident) (HCC)    Fibromyalgia    History of recurrent UTIs    HLD (hyperlipidemia)    Hypertension     Past Surgical History:  Procedure Laterality Date   ABDOMINAL HYSTERECTOMY     BREAST LUMPECTOMY     OVARIAN CYST REMOVAL     TONSILLECTOMY      Social History   Socioeconomic History   Marital status: Married    Spouse name: Not on file   Number of children: 4   Years of education: Not on file   Highest education level: Some college, no degree  Occupational History   Not on file  Tobacco Use   Smoking status: Never   Smokeless tobacco: Never  Vaping Use   Vaping status: Never Used  Substance and Sexual Activity   Alcohol use: Yes    Alcohol/week: 0.0 standard drinks of  alcohol    Comment: rare   Drug use: No   Sexual activity: Not on file  Other Topics Concern   Not on file  Social History Narrative   Not on file   Social Determinants of Health   Financial Resource Strain: Low Risk  (11/21/2022)   Overall Financial Resource Strain (CARDIA)    Difficulty of Paying Living Expenses: Not hard at all  Food Insecurity: No Food Insecurity (11/21/2022)   Hunger Vital Sign    Worried About Running Out of Food in the Last Year: Never true    Ran Out of Food in the Last Year: Never true  Transportation Needs: No Transportation Needs (11/21/2022)   PRAPARE - Administrator, Civil Service (Medical): No    Lack of Transportation (Non-Medical):  No  Physical Activity: Insufficiently Active (11/21/2022)   Exercise Vital Sign    Days of Exercise per Week: 3 days    Minutes of Exercise per Session: 20 min  Stress: No Stress Concern Present (11/21/2022)   [employer] of Occupational Health - Occupational Stress Questionnaire    Feeling of Stress : Not at all  Social Connections: Moderately Integrated (11/21/2022)   Social Connection and Isolation Panel [NHANES]    Frequency of Communication with Friends and Family: More than three times a week    Frequency of Social Gatherings with Friends and Family: More than three times a week    Attends Religious Services: More than 4 times per year    Active Member of [employer] or Organizations: No    Attends Banker Meetings: Never    Marital Status: Married  Catering manager Violence: Not At Risk (11/21/2022)   Humiliation, Afraid, Rape, and Kick questionnaire    Fear of Current or Ex-Partner: No    Emotionally Abused: No    Physically Abused: No    Sexually Abused: No    Family History  Problem Relation Age of Onset   Heart failure Mother    Thyroid nodules Mother    Heart disease Mother    Coronary artery disease Mother    Hypertension Father    Dementia Father    Alcohol abuse Brother    Hyperlipidemia Daughter    Hypertension Daughter    Cancer Maternal Aunt        breast   Breast cancer Maternal Aunt    Cancer Paternal Aunt        breast   Breast cancer Paternal Aunt     ROS: no fevers or chills, productive cough, hemoptysis, dysphasia, odynophagia, melena, hematochezia, dysuria, hematuria, rash, seizure activity, orthopnea, PND, pedal edema, claudication. Remaining systems are negative.  Physical Exam:   Blood pressure 132/70, pulse 82, height 5\' 2"  (1.575 m), weight 186 lb 12.8 oz (84.7 kg), last menstrual period 05/08/1995, SpO2 96%.  General:  Well developed/well nourished in NAD Skin warm/dry Patient not depressed No peripheral  clubbing Back-normal HEENT-normal/normal eyelids Neck supple/normal carotid upstroke bilaterally; no bruits; no JVD; no thyromegaly chest - CTA/ normal expansion CV - RRR/normal S1 and S2; no murmurs, rubs or gallops;  PMI nondisplaced Abdomen -NT/ND, no HSM, no mass, + bowel sounds, no bruit 2+ femoral pulses, no bruits Ext-no edema, chords, 2+ DP Neuro-grossly nonfocal  ECG - Sinus bradycardia. personally reviewed  A/P  1 prior CVA-echocardiogram showed normal LV function.  Will arrange transesophageal echocardiogram to more fully assess and to potentially rule out PFO.  Notes she has also had intermittent palpitations for years.  Will arrange evaluation by electrophysiology for implantable loop monitor to exclude atrial fibrillation.  2 hypertension-blood pressure controlled.  Continue present medications.  3 hyperlipidemia-continue statin.  4 history of palpitations-as above we will plan implantable loop monitor to exclude atrial fibrillation.  Olga Millers, MD

## 2022-12-25 NOTE — H&P (View-Only) (Signed)
Valda Lamb MD Reason for referral-cerebrovascular disease  HPI: 72 year old female for evaluation of cerebrovascular disease at request of Vinnie Langton MD.  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  Echocardiogram May 2024 showed normal LV function, grade 1 diastolic dysfunction.  Carotid Dopplers June 2024 showed minimal to moderate amount of atherosclerotic plaque left greater than right not resulting in hemodynamically significant stenosis in either internal carotid artery.  Patient seen by neurology and monitor recommended.  Patient has had occasional palpitations since she was 72 years old.  They are described as a flutter and can last several minutes at a time.  She typically has these 1-2 times weekly.  No associated symptoms.  This past spring she developed new onset episodes of dizziness.  There was no associated palpitations, dyspnea, chest pain or weakness/loss of sensation in her extremities.  This ultimately led to the MRI which showed a left cerebellar infarct.  Other workup was benign as outlined above.  Cardiology now asked to evaluate.  Current Outpatient Medications  Medication Sig Dispense Refill   aspirin EC 81 MG tablet Take 81 mg by mouth daily. Swallow whole.     atenolol (TENORMIN) 25 MG tablet Take 1 tablet (25 mg total) by mouth daily. 90 tablet 3   Cholecalciferol (D3-1000 PO) Take 1 tablet by mouth daily.     Cyanocobalamin (B-12 PO) Take 1 capsule by mouth daily at 6 (six) AM.     losartan (COZAAR) 50 MG tablet Take 1 tablet (50 mg total) by mouth daily. 90 tablet 3   Multiple Vitamins-Minerals (WOMENS MULTI PO) Take 1 tablet by mouth daily.       rosuvastatin (CRESTOR) 5 MG tablet TAKE 1 TABLET BY MOUTH ONCE A DAY 90 tablet 3   trimethoprim (TRIMPEX) 100 MG tablet Take 1 tablet (100 mg total) by mouth daily. 90 tablet 3   SUMAtriptan (IMITREX) 50 MG tablet TAKE 1 TABLET EVERY 2 HOURS AS NEEDED FOR MIGRAINE--MAX 2 DOSES PER DAY (Patient not  taking: Reported on 12/31/2022) 10 tablet 11   venlafaxine (EFFEXOR) 37.5 MG tablet Take 37.5 mg by mouth daily. (Patient not taking: Reported on 11/21/2022)     No current facility-administered medications for this visit.    Allergies  Allergen Reactions   Amoxicillin-Pot Clavulanate Diarrhea    Diarrhea    Doxycycline Other (See Comments)    vomiting   Nitrofurantoin     REACTION: hives     Past Medical History:  Diagnosis Date   Anxiety reaction    Back pain    Breast cancer (HCC)    CVA (cerebrovascular accident) (HCC)    Fibromyalgia    History of recurrent UTIs    HLD (hyperlipidemia)    Hypertension     Past Surgical History:  Procedure Laterality Date   ABDOMINAL HYSTERECTOMY     BREAST LUMPECTOMY     OVARIAN CYST REMOVAL     TONSILLECTOMY      Social History   Socioeconomic History   Marital status: Married    Spouse name: Not on file   Number of children: 4   Years of education: Not on file   Highest education level: Some college, no degree  Occupational History   Not on file  Tobacco Use   Smoking status: Never   Smokeless tobacco: Never  Vaping Use   Vaping status: Never Used  Substance and Sexual Activity   Alcohol use: Yes    Alcohol/week: 0.0 standard drinks of  alcohol    Comment: rare   Drug use: No   Sexual activity: Not on file  Other Topics Concern   Not on file  Social History Narrative   Not on file   Social Determinants of Health   Financial Resource Strain: Low Risk  (11/21/2022)   Overall Financial Resource Strain (CARDIA)    Difficulty of Paying Living Expenses: Not hard at all  Food Insecurity: No Food Insecurity (11/21/2022)   Hunger Vital Sign    Worried About Running Out of Food in the Last Year: Never true    Ran Out of Food in the Last Year: Never true  Transportation Needs: No Transportation Needs (11/21/2022)   PRAPARE - Administrator, Civil Service (Medical): No    Lack of Transportation (Non-Medical):  No  Physical Activity: Insufficiently Active (11/21/2022)   Exercise Vital Sign    Days of Exercise per Week: 3 days    Minutes of Exercise per Session: 20 min  Stress: No Stress Concern Present (11/21/2022)   [employer] of Occupational Health - Occupational Stress Questionnaire    Feeling of Stress : Not at all  Social Connections: Moderately Integrated (11/21/2022)   Social Connection and Isolation Panel [NHANES]    Frequency of Communication with Friends and Family: More than three times a week    Frequency of Social Gatherings with Friends and Family: More than three times a week    Attends Religious Services: More than 4 times per year    Active Member of [employer] or Organizations: No    Attends Banker Meetings: Never    Marital Status: Married  Catering manager Violence: Not At Risk (11/21/2022)   Humiliation, Afraid, Rape, and Kick questionnaire    Fear of Current or Ex-Partner: No    Emotionally Abused: No    Physically Abused: No    Sexually Abused: No    Family History  Problem Relation Age of Onset   Heart failure Mother    Thyroid nodules Mother    Heart disease Mother    Coronary artery disease Mother    Hypertension Father    Dementia Father    Alcohol abuse Brother    Hyperlipidemia Daughter    Hypertension Daughter    Cancer Maternal Aunt        breast   Breast cancer Maternal Aunt    Cancer Paternal Aunt        breast   Breast cancer Paternal Aunt     ROS: no fevers or chills, productive cough, hemoptysis, dysphasia, odynophagia, melena, hematochezia, dysuria, hematuria, rash, seizure activity, orthopnea, PND, pedal edema, claudication. Remaining systems are negative.  Physical Exam:   Blood pressure 132/70, pulse 82, height 5\' 2"  (1.575 m), weight 186 lb 12.8 oz (84.7 kg), last menstrual period 05/08/1995, SpO2 96%.  General:  Well developed/well nourished in NAD Skin warm/dry Patient not depressed No peripheral  clubbing Back-normal HEENT-normal/normal eyelids Neck supple/normal carotid upstroke bilaterally; no bruits; no JVD; no thyromegaly chest - CTA/ normal expansion CV - RRR/normal S1 and S2; no murmurs, rubs or gallops;  PMI nondisplaced Abdomen -NT/ND, no HSM, no mass, + bowel sounds, no bruit 2+ femoral pulses, no bruits Ext-no edema, chords, 2+ DP Neuro-grossly nonfocal  ECG - Sinus bradycardia. personally reviewed  A/P  1 prior CVA-echocardiogram showed normal LV function.  Will arrange transesophageal echocardiogram to more fully assess and to potentially rule out PFO.  Notes she has also had intermittent palpitations for years.  Will arrange evaluation by electrophysiology for implantable loop monitor to exclude atrial fibrillation.  2 hypertension-blood pressure controlled.  Continue present medications.  3 hyperlipidemia-continue statin.  4 history of palpitations-as above we will plan implantable loop monitor to exclude atrial fibrillation.  Olga Millers, MD

## 2022-12-31 ENCOUNTER — Ambulatory Visit: Payer: Medicare Other | Attending: Cardiology | Admitting: Cardiology

## 2022-12-31 ENCOUNTER — Encounter: Payer: Self-pay | Admitting: Cardiology

## 2022-12-31 VITALS — BP 132/70 | HR 82 | Ht 62.0 in | Wt 186.8 lb

## 2022-12-31 DIAGNOSIS — I639 Cerebral infarction, unspecified: Secondary | ICD-10-CM

## 2022-12-31 DIAGNOSIS — I10 Essential (primary) hypertension: Secondary | ICD-10-CM

## 2022-12-31 DIAGNOSIS — E7800 Pure hypercholesterolemia, unspecified: Secondary | ICD-10-CM | POA: Diagnosis not present

## 2022-12-31 DIAGNOSIS — R002 Palpitations: Secondary | ICD-10-CM | POA: Diagnosis not present

## 2022-12-31 NOTE — Patient Instructions (Addendum)
Medication Instructions:  *If you need a refill on your cardiac medications before your next appointment, please call your pharmacy*   Lab Work: If you have labs (blood work) drawn today and your tests are completely normal, you will receive your results only by: MyChart Message (if you have MyChart) OR A paper copy in the mail If you have any lab test that is abnormal or we need to change your treatment, we will call you to review the results.   Testing/Procedures:     Dear Amy Mills are scheduled for a TEE (Transesophageal Echocardiogram) on Friday, September 13 with Dr. Jens Som. Please arrive at the [facility] (Main Entrance A) at [facility]: [address] [place], [place] [address] at 9:30 AM (This time is 2 hour(s) before your procedure to ensure your preparation). Free valet parking service is available. You will check in at ADMITTING. The support person will be asked to wait in the waiting room.  It is OK to have someone drop you off and come back when you are ready to be discharged.      DIET:  Nothing to eat or drink after midnight except a sip of water with medications (see medication instructions below)  MEDICATION INSTRUCTIONS: !!IF ANY NEW MEDICATIONS ARE STARTED AFTER TODAY, PLEASE NOTIFY YOUR PROVIDER AS SOON AS POSSIBLE!!  FYI: Medications such as Semaglutide (Ozempic, [place]), Tirzepatide (Mounjaro, Zepbound), Dulaglutide (Trulicity), etc ("GLP1 agonists") AND Canagliflozin (Invokana), Dapagliflozin (Farxiga), Empagliflozin (Jardiance), Ertugliflozin (Steglatro), Bexagliflozin [employer]) or any combination with one of these drugs such as Invokamet (Canagliflozin/Metformin), Synjardy (Empagliflozin/Metformin), etc ("SGLT2 inhibitors") must be held around the time of a procedure. This is not a comprehensive list of all of these drugs. Please review all of your medications and talk to your provider if you take any one of these. If you are not sure, ask your  provider.    FYI:  For your safety, and to allow [place] to monitor your vital signs accurately during the surgery/procedure we request: If you have artificial nails, gel coating, SNS etc, please have those removed prior to your surgery/procedure. Not having the nail coverings /polish removed may result in cancellation or delay of your surgery/procedure.  You must have a responsible person to drive you home and stay in the waiting area during your procedure. Failure to do so could result in cancellation.  Bring your insurance cards.  *Special Note: Every effort is made to have your procedure done on time. Occasionally there are emergencies that occur at the hospital that may cause delays. Please be patient if a delay does occur.       Follow-Up: At [facility], you and your health needs are our priority.  As part of our continuing mission to provide you with exceptional heart care, we have created designated Provider Care Teams.  These Care Teams include your primary Cardiologist (physician) and Advanced Practice Providers (APPs -  Physician Assistants and Nurse Practitioners) who all work together to provide you with the care you need, when you need it.  We recommend signing up for the patient portal called "MyChart".  Sign up information is provided on this After Visit Summary.  MyChart is used to connect with patients for Virtual Visits (Telemedicine).  Patients are able to view lab/test results, encounter notes, upcoming appointments, etc.  Non-urgent messages can be sent to your provider as well.   To learn more about what you can do with MyChart, go to [email or web address].

## 2023-01-01 LAB — CBC
Hematocrit: 39.1 % (ref 34.0–46.6)
Hemoglobin: 13.3 g/dL (ref 11.1–15.9)
MCH: 31.2 pg (ref 26.6–33.0)
MCHC: 34 g/dL (ref 31.5–35.7)
MCV: 92 fL (ref 79–97)
Platelets: 222 10*3/uL (ref 150–450)
RBC: 4.26 x10E6/uL (ref 3.77–5.28)
RDW: 13.1 % (ref 11.7–15.4)
WBC: 6.8 10*3/uL (ref 3.4–10.8)

## 2023-01-01 LAB — BASIC METABOLIC PANEL
BUN/Creatinine Ratio: 15 (ref 12–28)
BUN: 15 mg/dL (ref 8–27)
CO2: 24 mmol/L (ref 20–29)
Calcium: 9.6 mg/dL (ref 8.7–10.3)
Chloride: 102 mmol/L (ref 96–106)
Creatinine, Ser: 0.97 mg/dL (ref 0.57–1.00)
Glucose: 87 mg/dL (ref 70–99)
Potassium: 4.2 mmol/L (ref 3.5–5.2)
Sodium: 140 mmol/L (ref 134–144)
eGFR: 62 mL/min/{1.73_m2} (ref >59)

## 2023-01-10 DIAGNOSIS — K080 Exfoliation of teeth due to systemic causes: Secondary | ICD-10-CM | POA: Diagnosis not present

## 2023-01-17 NOTE — Progress Notes (Signed)
Spoke to pt and instructed them to come at 0915 and to be NPO after 0000.  Confirmed that pt will have a ride home and someone to stay with them for 24 hours after the procedure.

## 2023-01-18 ENCOUNTER — Encounter (HOSPITAL_COMMUNITY)
Admission: RE | Disposition: A | Discharge status: Home / Self Care | Payer: Self-pay | Source: Home / Self Care | Attending: Cardiovascular Disease

## 2023-01-18 ENCOUNTER — Ambulatory Visit (HOSPITAL_COMMUNITY): Payer: Medicare Other | Admitting: Anesthesiology

## 2023-01-18 ENCOUNTER — Ambulatory Visit (HOSPITAL_BASED_OUTPATIENT_CLINIC_OR_DEPARTMENT_OTHER)
Admission: RE | Admit: 2023-01-18 | Discharge: 2023-01-18 | Disposition: A | Discharge status: Home / Self Care | Payer: Medicare Other | Source: Ambulatory Visit | Attending: Cardiovascular Disease | Admitting: Cardiovascular Disease

## 2023-01-18 ENCOUNTER — Ambulatory Visit (HOSPITAL_COMMUNITY)
Admission: RE | Admit: 2023-01-18 | Discharge: 2023-01-18 | Disposition: A | Discharge status: Home / Self Care | Payer: Medicare Other | Attending: Cardiovascular Disease | Admitting: Cardiovascular Disease

## 2023-01-18 ENCOUNTER — Other Ambulatory Visit: Payer: Self-pay

## 2023-01-18 DIAGNOSIS — I10 Essential (primary) hypertension: Secondary | ICD-10-CM

## 2023-01-18 DIAGNOSIS — Z79899 Other long term (current) drug therapy: Secondary | ICD-10-CM | POA: Diagnosis not present

## 2023-01-18 DIAGNOSIS — R002 Palpitations: Secondary | ICD-10-CM | POA: Insufficient documentation

## 2023-01-18 DIAGNOSIS — I700 Atherosclerosis of aorta: Secondary | ICD-10-CM | POA: Insufficient documentation

## 2023-01-18 DIAGNOSIS — I639 Cerebral infarction, unspecified: Secondary | ICD-10-CM

## 2023-01-18 DIAGNOSIS — I340 Nonrheumatic mitral (valve) insufficiency: Secondary | ICD-10-CM | POA: Insufficient documentation

## 2023-01-18 DIAGNOSIS — Z8673 Personal history of transient ischemic attack (TIA), and cerebral infarction without residual deficits: Secondary | ICD-10-CM

## 2023-01-18 DIAGNOSIS — E7800 Pure hypercholesterolemia, unspecified: Secondary | ICD-10-CM | POA: Diagnosis not present

## 2023-01-18 DIAGNOSIS — I6529 Occlusion and stenosis of unspecified carotid artery: Secondary | ICD-10-CM

## 2023-01-18 DIAGNOSIS — F419 Anxiety disorder, unspecified: Secondary | ICD-10-CM

## 2023-01-18 DIAGNOSIS — E785 Hyperlipidemia, unspecified: Secondary | ICD-10-CM | POA: Insufficient documentation

## 2023-01-18 HISTORY — PX: TEE WITHOUT CARDIOVERSION: SHX5443

## 2023-01-18 LAB — ECHO TEE

## 2023-01-18 SURGERY — ECHOCARDIOGRAM, TRANSESOPHAGEAL
Anesthesia: Monitor Anesthesia Care

## 2023-01-18 MED ORDER — PROPOFOL 10 MG/ML IV BOLUS
INTRAVENOUS | Status: DC | PRN
  Administered 2023-01-18: 20 mg via INTRAVENOUS
  Administered 2023-01-18: 60 mg via INTRAVENOUS

## 2023-01-18 MED ORDER — PROPOFOL 500 MG/50ML IV EMUL
INTRAVENOUS | Status: DC | PRN
  Administered 2023-01-18: 100 ug/kg/min via INTRAVENOUS

## 2023-01-18 MED ORDER — SODIUM CHLORIDE 0.9 % IV SOLN: INTRAVENOUS | Status: DC

## 2023-01-18 MED ORDER — LIDOCAINE 2% (20 MG/ML) 5 ML SYRINGE
INTRAMUSCULAR | Status: DC | PRN
  Administered 2023-01-18: 100 mg via INTRAVENOUS

## 2023-01-18 NOTE — Anesthesia Postprocedure Evaluation (Signed)
Anesthesia Post Note  Patient: Amy Mills  Procedure(s) Performed: TRANSESOPHAGEAL ECHOCARDIOGRAM     Patient location during evaluation: Cath Lab Anesthesia Type: MAC Level of consciousness: awake Pain management: pain level controlled Vital Signs Assessment: post-procedure vital signs reviewed and stable Respiratory status: spontaneous breathing, nonlabored ventilation and respiratory function stable Cardiovascular status: blood pressure returned to baseline and stable Postop Assessment: no apparent nausea or vomiting Anesthetic complications: no   No notable events documented.  Last Vitals:  Vitals:   01/18/23 1055 01/18/23 1100  BP: 113/61 106/61  Pulse: 66 62  Resp: 20 15  Temp:  36.7 C  SpO2: 95% 95%    Last Pain:  Vitals:   01/18/23 1100  TempSrc: Temporal  PainSc: 0-No pain                 Wadie Liew P Roxie Gueye

## 2023-01-18 NOTE — Transfer of Care (Signed)
Immediate Anesthesia Transfer of Care Note  Patient: Amy Mills  Procedure(s) Performed: TRANSESOPHAGEAL ECHOCARDIOGRAM  Patient Location: PACU  Anesthesia Type:MAC  Level of Consciousness: awake, alert , and oriented  Airway & Oxygen Therapy: Patient Spontanous Breathing and Patient connected to face mask oxygen  Post-op Assessment: Report given to RN and Post -op Vital signs reviewed and stable  Post vital signs: Reviewed and stable  Last Vitals:  Vitals Value Taken Time  BP 127/110 01/18/23 1050  Temp    Pulse 69 01/18/23 1051  Resp 21 01/18/23 1051  SpO2 99 % 01/18/23 1051  Vitals shown include unfiled device data.  Last Pain:  Vitals:   01/18/23 0927  TempSrc:   PainSc: 0-No pain         Complications: No notable events documented.

## 2023-01-18 NOTE — Anesthesia Preprocedure Evaluation (Signed)
Anesthesia Evaluation  Patient identified by MRN, date of birth, ID band Patient awake    Reviewed: Allergy & Precautions, NPO status , Patient's Chart, lab work & pertinent test results  Airway Mallampati: II  TM Distance: >3 FB Neck ROM: Full    Dental no notable dental hx.    Pulmonary neg pulmonary ROS   Pulmonary exam normal        Cardiovascular hypertension, Pt. on home beta blockers and Pt. on medications Normal cardiovascular exam     Neuro/Psych  Headaches  Anxiety      Neuromuscular disease CVA    GI/Hepatic negative GI ROS, Neg liver ROS,,,  Endo/Other  negative endocrine ROS    Renal/GU negative Renal ROS     Musculoskeletal  (+)  Fibromyalgia -  Abdominal  (+) + obese  Peds  Hematology negative hematology ROS (+)   Anesthesia Other Findings STROKE  Reproductive/Obstetrics                             Anesthesia Physical Anesthesia Plan  ASA: 3  Anesthesia Plan: MAC   Post-op Pain Management:    Induction:   PONV Risk Score and Plan: 2 and Propofol infusion and Treatment may vary due to age or medical condition  Airway Management Planned: Nasal Cannula  Additional Equipment:   Intra-op Plan:   Post-operative Plan:   Informed Consent: I have reviewed the patients History and Physical, chart, labs and discussed the procedure including the risks, benefits and alternatives for the proposed anesthesia with the patient or authorized representative who has indicated his/her understanding and acceptance.     Dental advisory given  Plan Discussed with: CRNA  Anesthesia Plan Comments:        Anesthesia Quick Evaluation

## 2023-01-18 NOTE — Op Note (Signed)
INDICATIONS: Cryptogenic stroke  PROCEDURE:   Informed consent was obtained prior to the procedure. The risks, benefits and alternatives for the procedure were discussed and the patient comprehended these risks.  Risks include, but are not limited to, cough, sore throat, vomiting, nausea, somnolence, esophageal and stomach trauma or perforation, bleeding, low blood pressure, aspiration, pneumonia, infection, trauma to the teeth and death.    After a procedural time-out, the oropharynx was anesthetized with 20% benzocaine spray.   During this procedure the patient was administered IV propofol by Anesthesiology (Dr. Bradley Ferris).  The transesophageal probe was inserted in the esophagus and stomach without difficulty and multiple views were obtained.  The patient was kept under observation until the patient left the procedure room.  The patient left the procedure room in stable condition.   Agitated microbubble saline contrast was administered.  COMPLICATIONS:    There were no immediate complications.  FINDINGS:  Severe aortic atherosclerosis with ulcerated and mobile plaque in the descending aorta. Plaque is not as severe in the aortic arch, but protruding plaques are seen in the arch as well. Normal LV function. No LA appendage thrombus. Tiny ruptured chorda tendina to anterior mitral leaflet with mild MR. No PFO/ASD seen.  RECOMMENDATIONS:    Although there is no direct causal evidence, aortic atheroembolic disease appears to be a plausible source for previous stroke.  Time Spent Directly with the Patient:  30 minutes   Amy Mills 01/18/2023, 10:44 AM

## 2023-01-18 NOTE — Interval H&P Note (Signed)
History and Physical Interval Note:  01/18/2023 9:15 AM  Amy Mills  has presented today for surgery, with the diagnosis of STROKE.  The various methods of treatment have been discussed with the patient and family. After consideration of risks, benefits and other options for treatment, the patient has consented to  Procedure(s): TRANSESOPHAGEAL ECHOCARDIOGRAM (N/A) as a surgical intervention.  The patient's history has been reviewed, patient examined, no change in status, stable for surgery.  I have reviewed the patient's chart and labs.  Questions were answered to the patient's satisfaction.     Taequan Stockhausen

## 2023-01-18 NOTE — Progress Notes (Signed)
  Echocardiogram Echocardiogram Transesophageal has been performed.  Amy Mills 01/18/2023, 10:59 AM

## 2023-01-22 ENCOUNTER — Encounter (HOSPITAL_COMMUNITY): Payer: Self-pay | Admitting: Cardiovascular Disease

## 2023-01-28 ENCOUNTER — Ambulatory Visit (INDEPENDENT_AMBULATORY_CARE_PROVIDER_SITE_OTHER): Payer: Medicare Other | Admitting: Family Medicine

## 2023-01-28 ENCOUNTER — Ambulatory Visit (INDEPENDENT_AMBULATORY_CARE_PROVIDER_SITE_OTHER)
Admission: RE | Admit: 2023-01-28 | Discharge: 2023-01-28 | Disposition: A | Discharge status: Home / Self Care | Payer: Medicare Other | Source: Ambulatory Visit | Attending: Family Medicine | Admitting: Family Medicine

## 2023-01-28 ENCOUNTER — Encounter: Payer: Self-pay | Admitting: Family Medicine

## 2023-01-28 VITALS — BP 130/74 | HR 65 | Temp 97.6°F | Ht 62.0 in | Wt 184.5 lb

## 2023-01-28 DIAGNOSIS — I10 Essential (primary) hypertension: Secondary | ICD-10-CM

## 2023-01-28 DIAGNOSIS — Z853 Personal history of malignant neoplasm of breast: Secondary | ICD-10-CM | POA: Diagnosis not present

## 2023-01-28 DIAGNOSIS — M898X1 Other specified disorders of bone, shoulder: Secondary | ICD-10-CM | POA: Diagnosis not present

## 2023-01-28 DIAGNOSIS — M7918 Myalgia, other site: Secondary | ICD-10-CM | POA: Diagnosis not present

## 2023-01-28 DIAGNOSIS — M129 Arthropathy, unspecified: Secondary | ICD-10-CM | POA: Diagnosis not present

## 2023-01-28 DIAGNOSIS — M25512 Pain in left shoulder: Secondary | ICD-10-CM | POA: Diagnosis not present

## 2023-01-28 DIAGNOSIS — Z8673 Personal history of transient ischemic attack (TIA), and cerebral infarction without residual deficits: Secondary | ICD-10-CM | POA: Diagnosis not present

## 2023-01-28 DIAGNOSIS — M2550 Pain in unspecified joint: Secondary | ICD-10-CM

## 2023-01-28 DIAGNOSIS — E7800 Pure hypercholesterolemia, unspecified: Secondary | ICD-10-CM

## 2023-01-28 NOTE — Assessment & Plan Note (Addendum)
Recent TEE Noted atheroma plaque inv the descending aorta  On statin  Last LDL 54  Blood pressure is controlled On asa 81 mg daily  For cardiology follow up and plan

## 2023-01-28 NOTE — Assessment & Plan Note (Addendum)
Left clavicle pain  History of breast cancer  No tenderness or swelling  Xray today : clear clavicle and cxr / reassuring   ? Strain or costochondritis or part of her myofacial pain discorder  Advised use of ice/heat /voltaren gel  Will continue to monitor

## 2023-01-28 NOTE — Patient Instructions (Addendum)
Call the cardiology office regarding your TEE and plan    Schedule annual exam after oct 16th  Schedule fasting labs when convenient      I am going to add an inflammation lab to your pre physical labs   I am considering a medication called cymbalta for pain   We will get back to you about xray reports tomorrow   Warm and cool compresses and voltaren gel are ok

## 2023-01-28 NOTE — Progress Notes (Signed)
Subjective:    Patient ID: Amy Mills, female    DOB: 02-14-1951, 72 y.o.   MRN: [id number]  HPI  Wt Readings from Last 3 Encounters:  01/28/23 184 lb 8 oz (83.7 kg)  01/18/23 180 lb (81.6 kg)  12/31/22 186 lb 12.8 oz (84.7 kg)   33.75 kg/m  Vitals:   01/28/23 1558  BP: 130/74  Pulse: 65  Temp: 97.6 F (36.4 C)  SpO2: 99%    Pt presents for ongoing neck pain /anterior  12 weeks   Neck pain / collarbone area on the left side  Tearing sensation/kind of sharp  ? 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 There is   severe, mobile (Grade V) layered and atheroma plaque involving the  descending aorta.   Lab Results  Component Value Date   CHOL 107 02/12/2022   HDL 21.40 (L) 02/12/2022   LDLCALC 54 02/12/2022   LDLDIRECT 212.8 07/02/2006   TRIG 156.0 (H) 02/12/2022   CHOLHDL 5 02/12/2022   On crestor 5 mg daily   A lot of pain all over -worse since she cannot take the diclofenac  Wonders what options are   ? CBD oil   Mood is good     01/28/2023    4:05 PM 11/21/2022    2:06 PM 09/28/2022    3:14 PM 08/07/2022    9:43 AM 04/13/2022    2:45 PM  Depression screen PHQ 2/9  Decreased Interest 0 0 0 0 0  Down, Depressed, Hopeless 0 0 0 0 0  PHQ - 2 Score 0 0 0 0 0  Altered sleeping 1 0 0 0   Tired, decreased energy 1 0 0 1   Change in appetite 0 0 0 0   Feeling bad or failure about yourself  0 0 0 0   Trouble concentrating 0 0 0 0   Moving slowly or  fidgety/restless 0 0 0 0   Suicidal thoughts 0 0 0 0   PHQ-9 Score 2 0 0 1   Difficult doing work/chores Not difficult at all Not difficult at all Not difficult at all Not difficult at all    Xr reports  DG Chest 2 View  Result Date: 01/28/2023 CLINICAL DATA:  Left medial clavicular pain. History of breast cancer. EXAM: CHEST - 2 VIEW COMPARISON:  02/05/2011 FINDINGS: A specific cause for the patient's reported pain in the left clavicular region is not identified. The lungs appear clear. Cardiac and mediastinal contours normal. No blunting of the costophrenic angles. Clips noted along the right axilla. IMPRESSION: 1. No active cardiopulmonary disease is radiographically apparent. 2. A specific cause for the patient's reported left clavicular pain is not identified. Electronically Signed   By: Gaylyn Rong M.D.   On: 01/28/2023 18:16   DG Clavicle Left  Result Date: 01/28/2023 CLINICAL DATA:  Left medial clavicular pain. History of breast cancer. EXAM: LEFT CLAVICLE - 2+  VIEWS COMPARISON:  Chest radiograph 02/05/2011 FINDINGS: Mild degenerative glenohumeral arthropathy on the left. No underlying clavicular lesion or fracture. Atherosclerotic calcification of the aortic arch. IMPRESSION: 1. Mild degenerative glenohumeral arthropathy on the left. No specific lesion left clavicle is identified. 2.  Aortic Atherosclerosis (ICD10-I70.0). Electronically Signed   By: Gaylyn Rong M.D.   On: 01/28/2023 18:08   ECHO TEE  Result Date: 01/18/2023    TRANSESOPHOGEAL ECHO REPORT   Patient Name:   Amy Mills Date of Exam: 01/18/2023 Medical Rec #:  [id number]    Height:       62.0 in Accession #:    [id number]   Weight:       186.8 lb Date of Birth:  1951/03/25    BSA:          1.857 m Patient Age:    71 years     BP:           136/62 mmHg Patient Gender: F            HR:           83 bpm. Exam Location:  Inpatient Procedure: Transesophageal Echo, Cardiac Doppler and Color Doppler Indications:      Stroke  History:         Patient has prior history of Echocardiogram examinations.                  Abnormal ECG, Stroke, Signs/Symptoms:Dizziness/Lightheadedness;                  Risk Factors:Dyslipidemia.  Sonographer:     Sheralyn Boatman RDCS Referring Phys:  [phone] MIHAI CROITORU Diagnosing Phys: Thurmon Fair MD PROCEDURE: After discussion of the risks and benefits of a TEE, an informed consent was obtained from the patient. The transesophogeal probe was passed without difficulty through the esophogus of the patient. Imaged were obtained with the patient in a left lateral decubitus position. Sedation performed by different physician. The patient was monitored while under deep sedation. Anesthestetic sedation was provided intravenously by Anesthesiology: 202mg  of Propofol, 100mg  of Lidocaine. The patient developed no complications during the procedure.  IMPRESSIONS  1. Left ventricular ejection fraction, by estimation, is 50 to 55%. The left ventricle has low normal function. The left ventricle has no regional wall motion abnormalities.  2. Right ventricular systolic function is normal. The right ventricular size is normal.  3. No left atrial/left atrial appendage thrombus was detected.  4. There appears to be a very small torn chorda tendina to the anterior mitral leaflet, with prolapse of a very small segment of the edge of A2.. The mitral valve is degenerative. Mild mitral valve regurgitation. No evidence of mitral stenosis.  5. The aortic valve is tricuspid. Aortic valve regurgitation is trivial. No aortic stenosis is present.  6. There is Severe, mobile (Grade V) layered and atheroma plaque involving the descending aorta. Plaque thickness is up to 6 mm. In the aortic arch there is moderate protruding plaque., up to 4.5 mm thickness.  7. Agitated saline contrast bubble study was negative, with no evidence of any interatrial shunt. FINDINGS  Left Ventricle: Left ventricular ejection fraction, by estimation, is 50  to 55%. The left ventricle has low normal function. The left ventricle has no regional wall motion abnormalities. The left ventricular internal cavity size was normal in size. There is no left ventricular hypertrophy. Right Ventricle: The right ventricular size is normal. No increase in right ventricular wall thickness. Right ventricular systolic function  is normal. Left Atrium: Left atrial size was normal in size. No left atrial/left atrial appendage thrombus was detected. Right Atrium: Right atrial size was normal in size. Pericardium: Trivial pericardial effusion is present. The pericardial effusion is localized near the right ventricle. Mitral Valve: There appears to be a very small torn chorda tendina to the anterior mitral leaflet, with prolapse of a very small segment of the edge of A2. The mitral valve is degenerative in appearance. Mild mitral valve regurgitation. No evidence of mitral valve stenosis. Tricuspid Valve: The tricuspid valve is normal in structure. Tricuspid valve regurgitation is not demonstrated. No evidence of tricuspid stenosis. Aortic Valve: The aortic valve is tricuspid. Aortic valve regurgitation is trivial. No aortic stenosis is present. Pulmonic Valve: The pulmonic valve was normal in structure. Pulmonic valve regurgitation is trivial. No evidence of pulmonic stenosis. Aorta: The aortic root and ascending aorta are structurally normal, with no evidence of dilitation and the aortic root, ascending aorta, aortic arch and descending aorta are all structurally normal, with no evidence of dilitation or obstruction. There is  severe, mobile (Grade V) layered and atheroma plaque involving the descending aorta. IAS/Shunts: No atrial level shunt detected by color flow Doppler. Agitated saline contrast bubble study was negative, with no evidence of any interatrial shunt. Thurmon Fair MD Electronically signed by Thurmon Fair MD Signature Date/Time: 01/18/2023/2:50:40 PM    Final    EP  STUDY  Result Date: 01/18/2023 See surgical note for result.     Patient Active Problem List   Diagnosis Date Noted   Clavicle pain 01/28/2023   Joint pain 01/28/2023   Carotid atherosclerosis 10/22/2022   Palpitations 09/14/2022   Chronic cerebrovascular accident (CVA) 09/14/2022   Headache 09/05/2022   Dizziness 08/07/2022   Onychomycosis 04/13/2022   Urine frequency 02/19/2022   History of breast cancer 09/12/2021   Colon cancer screening 12/12/2020   Medicare annual wellness visit, subsequent 08/28/2017   Estrogen deficiency 08/28/2017   Prediabetes 06/13/2015   Obesity 06/13/2015   Routine general medical examination at a health care facility 09/14/2011   STRESS REACTION, ACUTE, WITH EMOTIONAL DISTURBANCE 05/12/2008   HYPERCHOLESTEROLEMIA 04/21/2007   Essential hypertension 04/21/2007   UTI'S, RECURRENT 04/21/2007   Diffuse myofascial pain syndrome 04/21/2007   RETRACTION/LAG, EYELID 09/20/2006   PARESTHESIA 09/20/2006   MRI, BRAIN, ABNORMAL 09/20/2006   Past Medical History:  Diagnosis Date   Anxiety reaction    Back pain    Breast cancer (HCC)    CVA (cerebrovascular accident) (HCC)    Fibromyalgia    History of recurrent UTIs    HLD (hyperlipidemia)    Hypertension    Past Surgical History:  Procedure Laterality Date   ABDOMINAL HYSTERECTOMY     BREAST LUMPECTOMY     OVARIAN CYST REMOVAL     TEE WITHOUT CARDIOVERSION N/A 01/18/2023   Procedure: TRANSESOPHAGEAL ECHOCARDIOGRAM;  Surgeon: Thurmon Fair, MD;  Location: [facility];  Service: Cardiovascular;  Laterality: N/A;   TONSILLECTOMY     Social History   Tobacco Use   Smoking status: Never   Smokeless tobacco: Never  Vaping Use   Vaping status: Never Used  Substance Use Topics   Alcohol use: Yes    Alcohol/week: 0.0 standard drinks of alcohol    Comment: rare   Drug use: No   Family History  Problem Relation Age of Onset   Heart failure Mother    Thyroid nodules Mother    Heart  disease Mother    Coronary  artery disease Mother    Hypertension Father    Dementia Father    Alcohol abuse Brother    Hyperlipidemia Daughter    Hypertension Daughter    Cancer Maternal Aunt        breast   Breast cancer Maternal Aunt    Cancer Paternal Aunt        breast   Breast cancer Paternal Aunt    Allergies  Allergen Reactions   Amoxicillin-Pot Clavulanate Diarrhea   Doxycycline Nausea And Vomiting   Nitrofurantoin Hives   Current Outpatient Medications on File Prior to Visit  Medication Sig Dispense Refill   acetaminophen (TYLENOL) 325 MG tablet Take 650-975 mg by mouth every 6 (six) hours as needed for moderate pain.     aspirin EC 81 MG tablet Take 81 mg by mouth daily. Swallow whole.     atenolol (TENORMIN) 25 MG tablet Take 1 tablet (25 mg total) by mouth daily. 90 tablet 3   cholecalciferol (VITAMIN D3) 25 MCG (1000 UNIT) tablet Take 1,000 Units by mouth daily.     Cyanocobalamin (B-12 PO) Take 1 capsule by mouth daily.     losartan (COZAAR) 50 MG tablet Take 1 tablet (50 mg total) by mouth daily. 90 tablet 3   Multiple Vitamins-Minerals (WOMENS MULTI PO) Take 1 tablet by mouth daily.       rosuvastatin (CRESTOR) 5 MG tablet TAKE 1 TABLET BY MOUTH ONCE A DAY 90 tablet 3   SUMAtriptan (IMITREX) 50 MG tablet TAKE 1 TABLET EVERY 2 HOURS AS NEEDED FOR MIGRAINE--MAX 2 DOSES PER DAY 10 tablet 11   trimethoprim (TRIMPEX) 100 MG tablet Take 1 tablet (100 mg total) by mouth daily. 90 tablet 3   No current facility-administered medications on file prior to visit.    Review of Systems  Constitutional:  Negative for activity change, appetite change, fatigue, fever and unexpected weight change.  HENT:  Negative for congestion, ear pain, rhinorrhea, sinus pressure and sore throat.   Eyes:  Negative for pain, redness and visual disturbance.  Respiratory:  Negative for cough, shortness of breath and wheezing.   Cardiovascular:  Negative for chest pain and palpitations.   Gastrointestinal:  Negative for abdominal pain, blood in stool, constipation and diarrhea.  Endocrine: Negative for polydipsia and polyuria.  Genitourinary:  Negative for dysuria, frequency and urgency.  Musculoskeletal:  Positive for arthralgias, back pain, myalgias and neck pain. Negative for joint swelling.  Skin:  Negative for pallor and rash.  Allergic/Immunologic: Negative for environmental allergies.  Neurological:  Negative for dizziness, syncope and headaches.  Hematological:  Negative for adenopathy. Does not bruise/bleed easily.  Psychiatric/Behavioral:  Negative for decreased concentration and dysphoric mood. The patient is not nervous/anxious.        Objective:   Physical Exam Constitutional:      General: She is not in acute distress.    Appearance: Normal appearance. She is well-developed. She is obese. She is not ill-appearing or diaphoretic.  HENT:     Head: Normocephalic and atraumatic.     Mouth/Throat:     Mouth: Mucous membranes are moist.  Eyes:     General: No scleral icterus.       Right eye: No discharge.        Left eye: No discharge.     Conjunctiva/sclera: Conjunctivae normal.     Pupils: Pupils are equal, round, and reactive to light.  Neck:     Thyroid: No thyromegaly.     Vascular: No carotid bruit  or JVD.  Cardiovascular:     Rate and Rhythm: Normal rate and regular rhythm.     Heart sounds: Normal heart sounds.     No gallop.  Pulmonary:     Effort: Pulmonary effort is normal. No respiratory distress.     Breath sounds: Normal breath sounds. No stridor. No wheezing, rhonchi or rales.     Comments: Pt indicates pain in area of left sternoclavicular junction  No point tenderness or step off/ crepitus or skin change    Chest:     Chest wall: No tenderness.  Abdominal:     General: There is no distension or abdominal bruit.     Palpations: Abdomen is soft.  Musculoskeletal:     Cervical back: Normal range of motion and neck supple. No  rigidity or tenderness.     Right lower leg: No edema.     Left lower leg: No edema.  Lymphadenopathy:     Cervical: No cervical adenopathy.  Skin:    General: Skin is warm and dry.     Coloration: Skin is not jaundiced or pale.     Findings: No bruising, lesion or rash.  Neurological:     Mental Status: She is alert.     Coordination: Coordination normal.     Deep Tendon Reflexes: Reflexes are normal and symmetric. Reflexes normal.  Psychiatric:        Mood and Affect: Mood normal.           Assessment & Plan:   Problem List Items Addressed This Visit       Cardiovascular and Mediastinum   Chronic cerebrovascular accident (CVA)    Recent TEE Noted atheroma plaque inv the descending aorta  On statin  Last LDL 54  Blood pressure is controlled On asa 81 mg daily  For cardiology follow up and plan      Essential hypertension    Better with addn of losartan bp in fair control at this time  BP Readings from Last 1 Encounters:  01/28/23 130/74   No changes needed Most recent labs reviewed  Disc lifstyle change with low sodium diet and exercise  Continue losartan 50 mg daily and atenolol 25 mg daily   Labs planned for follow up in oct       Relevant Orders   TSH   Lipid panel   Comprehensive metabolic panel   CBC with Differential/Platelet     Musculoskeletal and Integument   Diffuse myofascial pain syndrome    This seems to be worse lately  Unable to take nsaid due to recent cva   Will add sed rate and cpk to her upcoming labs as well as auto immune joint labs   If all normal - consider trial of cymbalta         Other   Clavicle pain - Primary    Left clavicle pain  History of breast cancer  No tenderness or swelling  Xray today : clear clavicle and cxr / reassuring   ? Strain or costochondritis or part of her myofacial pain discorder  Advised use of ice/heat /voltaren gel  Will continue to monitor       Relevant Orders   DG Clavicle Left  (Completed)   DG Chest 2 View (Completed)   History of breast cancer    Triple negative No anti estrogen drugs   Some clavicular pain on other side  Normal cxr and clavicle film  Reassuring but need to monitor  HYPERCHOLESTEROLEMIA    Disc goals for lipids and reasons to control them Rev last labs with pt Rev low sat fat diet in detail  Taking crestor 5 mg daily  Hope this is not worsening body pain  Labs ordered  Last LDL was 54   Has history of cva and aortic dz /carotid dz        Relevant Orders   Lipid panel   Comprehensive metabolic panel   Joint pain    Multiple joints Also some soft tissue  Has history of myofacial pain syndrome   Today some pain in clavicle area   Will add ESR and cpk to next labs / also auto immune labs  Does have history of some OA as well       Relevant Orders   Sedimentation Rate   CK   ANA w/Reflex   Rheumatoid factor

## 2023-01-28 NOTE — Assessment & Plan Note (Signed)
Better with addn of losartan bp in fair control at this time  BP Readings from Last 1 Encounters:  01/28/23 130/74   No changes needed Most recent labs reviewed  Disc lifstyle change with low sodium diet and exercise  Continue losartan 50 mg daily and atenolol 25 mg daily   Labs planned for follow up in oct

## 2023-01-28 NOTE — Assessment & Plan Note (Addendum)
Triple negative No anti estrogen drugs   Some clavicular pain on other side  Normal cxr and clavicle film  Reassuring but need to monitor

## 2023-01-28 NOTE — Assessment & Plan Note (Signed)
Disc goals for lipids and reasons to control them Rev last labs with pt Rev low sat fat diet in detail  Taking crestor 5 mg daily  Hope this is not worsening body pain  Labs ordered  Last LDL was 54   Has history of cva and aortic dz /carotid dz

## 2023-01-28 NOTE — Assessment & Plan Note (Addendum)
Multiple joints Also some soft tissue  Has history of myofacial pain syndrome   Today some pain in clavicle area   Will add ESR and cpk to next labs / also auto immune labs  Does have history of some OA as well

## 2023-01-28 NOTE — Assessment & Plan Note (Addendum)
This seems to be worse lately  Unable to take nsaid due to recent cva   Will add sed rate and cpk to her upcoming labs as well as auto immune joint labs   If all normal - consider trial of cymbalta

## 2023-01-29 ENCOUNTER — Telehealth: Payer: Self-pay | Admitting: Cardiovascular Disease

## 2023-01-29 NOTE — Telephone Encounter (Signed)
Patient is calling to get results of her TEE. Would like a call back to see what is next

## 2023-01-29 NOTE — Telephone Encounter (Signed)
Left detailed message (DPR) with e result call back with any questions

## 2023-01-31 ENCOUNTER — Encounter: Payer: Self-pay | Admitting: Cardiology

## 2023-01-31 DIAGNOSIS — I639 Cerebral infarction, unspecified: Secondary | ICD-10-CM

## 2023-01-31 DIAGNOSIS — Z8673 Personal history of transient ischemic attack (TIA), and cerebral infarction without residual deficits: Secondary | ICD-10-CM

## 2023-02-01 ENCOUNTER — Encounter: Payer: Self-pay | Admitting: *Deleted

## 2023-02-01 MED ORDER — ROSUVASTATIN CALCIUM 40 MG PO TABS: 40.0000 mg | ORAL_TABLET | Freq: Every day | ORAL | 3 refills | Status: DC

## 2023-02-01 NOTE — Telephone Encounter (Signed)
This encounter was created in error - please disregard.

## 2023-02-01 NOTE — Telephone Encounter (Signed)
Significant aortic plaque could be cause of CVA; continue ASA and increase crestor to 40 mg daily; lipids and liver 8 weeks; given history of palpitations and CVA she should be referred to EP for possible loop; can schedule fuov if needed for additional questions Olga Millers   Spoke with pt, Aware of dr Ludwig Clarks recommendations.  New script sent to the pharmacy  She is due for labs with her medical doctor in about 8 weeks. Referral placed She did not feel she needed an appointment at this time.

## 2023-02-25 ENCOUNTER — Other Ambulatory Visit (INDEPENDENT_AMBULATORY_CARE_PROVIDER_SITE_OTHER): Payer: Medicare Other

## 2023-02-25 ENCOUNTER — Encounter: Payer: Self-pay | Admitting: Family Medicine

## 2023-02-25 DIAGNOSIS — E7800 Pure hypercholesterolemia, unspecified: Secondary | ICD-10-CM

## 2023-02-25 DIAGNOSIS — M2550 Pain in unspecified joint: Secondary | ICD-10-CM

## 2023-02-25 DIAGNOSIS — I10 Essential (primary) hypertension: Secondary | ICD-10-CM

## 2023-02-25 NOTE — Addendum Note (Signed)
Addended by: Alvina Chou on: 02/25/2023 08:21 AM   Modules accepted: Orders

## 2023-02-25 NOTE — Addendum Note (Signed)
Addended by: Alvina Chou on: 02/25/2023 08:52 AM   Modules accepted: Orders

## 2023-02-26 LAB — CBC WITH DIFFERENTIAL/PLATELET
Basophils Absolute: 0 10*3/uL (ref 0.0–0.2)
Basos: 1 %
EOS (ABSOLUTE): 0.3 10*3/uL (ref 0.0–0.4)
Eos: 5 %
Hematocrit: 42.3 % (ref 34.0–46.6)
Hemoglobin: 13.8 g/dL (ref 11.1–15.9)
Immature Grans (Abs): 0 10*3/uL (ref 0.0–0.1)
Immature Granulocytes: 0 %
Lymphocytes Absolute: 1 10*3/uL (ref 0.7–3.1)
Lymphs: 18 %
MCH: 31.6 pg (ref 26.6–33.0)
MCHC: 32.6 g/dL (ref 31.5–35.7)
MCV: 97 fL (ref 79–97)
Monocytes Absolute: 0.5 10*3/uL (ref 0.1–0.9)
Monocytes: 8 %
Neutrophils Absolute: 4 10*3/uL (ref 1.4–7.0)
Neutrophils: 68 %
Platelets: 235 10*3/uL (ref 150–450)
RBC: 4.37 x10E6/uL (ref 3.77–5.28)
RDW: 12.6 % (ref 11.7–15.4)
WBC: 5.9 10*3/uL (ref 3.4–10.8)

## 2023-02-26 LAB — LIPID PANEL
Chol/HDL Ratio: 2.4 ratio (ref 0.0–4.4)
Cholesterol, Total: 128 mg/dL (ref 100–199)
HDL: 53 mg/dL (ref >39)
LDL Chol Calc (NIH): 54 mg/dL (ref 0–99)
Triglycerides: 121 mg/dL (ref 0–149)
VLDL Cholesterol Cal: 21 mg/dL (ref 5–40)

## 2023-02-26 LAB — COMPREHENSIVE METABOLIC PANEL
ALT: 25 [IU]/L (ref 0–32)
AST: 25 [IU]/L (ref 0–40)
Albumin: 4.4 g/dL (ref 3.8–4.8)
Alkaline Phosphatase: 85 [IU]/L (ref 44–121)
BUN/Creatinine Ratio: 24 (ref 12–28)
BUN: 18 mg/dL (ref 8–27)
Bilirubin Total: 0.4 mg/dL (ref 0.0–1.2)
CO2: 22 mmol/L (ref 20–29)
Calcium: 9.5 mg/dL (ref 8.7–10.3)
Chloride: 105 mmol/L (ref 96–106)
Creatinine, Ser: 0.75 mg/dL (ref 0.57–1.00)
Globulin, Total: 1.9 g/dL (ref 1.5–4.5)
Glucose: 106 mg/dL — ABNORMAL HIGH (ref 70–99)
Potassium: 4.8 mmol/L (ref 3.5–5.2)
Sodium: 141 mmol/L (ref 134–144)
Total Protein: 6.3 g/dL (ref 6.0–8.5)
eGFR: 85 mL/min/{1.73_m2} (ref >59)

## 2023-02-26 LAB — SEDIMENTATION RATE: Sed Rate: 7 mm/h (ref 0–40)

## 2023-02-26 LAB — ANA W/REFLEX: Anti Nuclear Antibody (ANA): NEGATIVE

## 2023-02-26 LAB — CK: Total CK: 238 U/L — ABNORMAL HIGH (ref 32–182)

## 2023-02-26 LAB — RHEUMATOID FACTOR: Rheumatoid fact SerPl-aCnc: 10.6 [IU]/mL (ref <14.0)

## 2023-02-26 LAB — TSH: TSH: 1.56 u[IU]/mL (ref 0.450–4.500)

## 2023-03-04 ENCOUNTER — Encounter: Payer: Self-pay | Admitting: Family Medicine

## 2023-03-04 ENCOUNTER — Ambulatory Visit (INDEPENDENT_AMBULATORY_CARE_PROVIDER_SITE_OTHER): Payer: Medicare Other | Admitting: Family Medicine

## 2023-03-04 VITALS — BP 118/66 | HR 89 | Temp 97.7°F | Ht 62.25 in | Wt 185.0 lb

## 2023-03-04 DIAGNOSIS — Z8673 Personal history of transient ischemic attack (TIA), and cerebral infarction without residual deficits: Secondary | ICD-10-CM

## 2023-03-04 DIAGNOSIS — E66811 Obesity, class 1: Secondary | ICD-10-CM

## 2023-03-04 DIAGNOSIS — Z1211 Encounter for screening for malignant neoplasm of colon: Secondary | ICD-10-CM

## 2023-03-04 DIAGNOSIS — Z0000 Encounter for general adult medical examination without abnormal findings: Secondary | ICD-10-CM | POA: Diagnosis not present

## 2023-03-04 DIAGNOSIS — Z853 Personal history of malignant neoplasm of breast: Secondary | ICD-10-CM

## 2023-03-04 DIAGNOSIS — M7918 Myalgia, other site: Secondary | ICD-10-CM | POA: Diagnosis not present

## 2023-03-04 DIAGNOSIS — M2550 Pain in unspecified joint: Secondary | ICD-10-CM | POA: Diagnosis not present

## 2023-03-04 DIAGNOSIS — I6523 Occlusion and stenosis of bilateral carotid arteries: Secondary | ICD-10-CM

## 2023-03-04 DIAGNOSIS — R7303 Prediabetes: Secondary | ICD-10-CM | POA: Diagnosis not present

## 2023-03-04 DIAGNOSIS — R748 Abnormal levels of other serum enzymes: Secondary | ICD-10-CM

## 2023-03-04 DIAGNOSIS — E6609 Other obesity due to excess calories: Secondary | ICD-10-CM

## 2023-03-04 DIAGNOSIS — E2839 Other primary ovarian failure: Secondary | ICD-10-CM

## 2023-03-04 DIAGNOSIS — E7800 Pure hypercholesterolemia, unspecified: Secondary | ICD-10-CM | POA: Diagnosis not present

## 2023-03-04 DIAGNOSIS — I10 Essential (primary) hypertension: Secondary | ICD-10-CM

## 2023-03-04 DIAGNOSIS — Z6833 Body mass index (BMI) 33.0-33.9, adult: Secondary | ICD-10-CM

## 2023-03-04 NOTE — Assessment & Plan Note (Signed)
Lab Results  Component Value Date   HGBA1C 6.5 02/12/2022   disc imp of low glycemic diet and wt loss to prevent DM2   Aware she is on the edge for diabetes at this time

## 2023-03-04 NOTE — Assessment & Plan Note (Signed)
Continues crestor high dose, asa and blood pressure control

## 2023-03-04 NOTE — Assessment & Plan Note (Signed)
Better with addn of losartan bp in fair control at this time  BP Readings from Last 1 Encounters:  03/04/23 118/66   No changes needed Most recent labs reviewed  Disc lifstyle change with low sodium diet and exercise  Continue losartan 50 mg daily and atenolol 25 mg daily

## 2023-03-04 NOTE — Assessment & Plan Note (Signed)
 Discussed how this problem influences overall health and the risks it imposes  Reviewed plan for weight loss with lower calorie diet (via better food choices (lower glycemic and portion control) along with exercise building up to or more than 30 minutes 5 days per week including some aerobic activity and strength training

## 2023-03-04 NOTE — Assessment & Plan Note (Signed)
With known vascular dz  Disc goals for lipids and reasons to control them Rev last labs with pt Rev low sat fat diet in detail HDL up to 53 LDL of 54 and normal trig   Continues crestor 40 mg  Tolerates well  Unsure if this is why cpk is up?

## 2023-03-04 NOTE — Assessment & Plan Note (Signed)
Reviewed health habits including diet and exercise and skin cancer prevention Reviewed appropriate screening tests for age  Also reviewed health mt list, fam hx and immunization status , as well as social and family history   See HPI Labs reviewed and ordered Declines flu, prevnar 20, tetanus and shingrix imms currently (may consider tetanus and shingrix later at pharmacy) Mammogram utd 09/2022 (personal history of breast cancer) GI referral done for screening colonoscopy  Regular eye care in setting of family history of mac deg  Uses sun protection  PHQ 2 due to work stress /encouraged to consider retirement or cutting back hours

## 2023-03-04 NOTE — Assessment & Plan Note (Signed)
No clinical changes  Rheumatoid labs negative  CK total was up -= may be related to crestor (but pt does not notice any difference with crestor in terms of muscle pain)

## 2023-03-04 NOTE — Assessment & Plan Note (Signed)
Continues follow up with cardiology  Blood pressure and cholesterol are controlled  Asa and crestor

## 2023-03-04 NOTE — Progress Notes (Signed)
Subjective:    Patient ID: Amy Mills, female    DOB: 03-24-1951, 72 y.o.   MRN: [id number]  HPI  Here for health maintenance exam and to review chronic medical problems   Wt Readings from Last 3 Encounters:  03/04/23 185 lb (83.9 kg)  01/28/23 184 lb 8 oz (83.7 kg)  01/18/23 180 lb (81.6 kg)   33.57 kg/m  Vitals:   03/04/23 1433  BP: 118/66  Pulse: 89  Temp: 97.7 F (36.5 C)  SpO2: 94%    Immunization History  Administered Date(s) Administered   PFIZER(Purple Top)SARS-COV-2 Vaccination 06/01/2019, 06/22/2019   Pneumococcal Conjugate-13 08/28/2017   Td 05/07/1997, 05/07/2005    Health Maintenance Due  Topic Date Due   Colonoscopy  05/15/2020   Immunizations Flu -declines  Tetanus - declines  Pna - prevnar 13 in 2019 / does not want the 20 yet  Shingrix - declines    Mammogram- 09/2022  Personal history of breast cancer  Self breast exam-nothing new  Sees rad/onc in December   Gyn health-no complaints    Colon cancer screening -colonoscopy 05/2010  Wants to get that done   Bone health  Dexa - 10/2017 -normal range  Falls- none  Fractures-none  Supplements - vitamin D3 Exercise - needs to do more  Ankle problems, likes chair exercise  May need ankle surgery   Macular degeneration in family  She sees eye doctor regularly   Does not see derm regularly    Mood    01/28/2023    4:05 PM 11/21/2022    2:06 PM 09/28/2022    3:14 PM 08/07/2022    9:43 AM 04/13/2022    2:45 PM  Depression screen PHQ 2/9  Decreased Interest 0 0 0 0 0  Down, Depressed, Hopeless 0 0 0 0 0  PHQ - 2 Score 0 0 0 0 0  Altered sleeping 1 0 0 0   Tired, decreased energy 1 0 0 1   Change in appetite 0 0 0 0   Feeling bad or failure about yourself  0 0 0 0   Trouble concentrating 0 0 0 0   Moving slowly or fidgety/restless 0 0 0 0   Suicidal thoughts 0 0 0 0   PHQ-9 Score 2 0 0 1   Difficult doing work/chores Not difficult at all Not difficult at all Not difficult at all  Not difficult at all   Her job is very stressful  Did want to keep working    HTN bp is stable today  No cp or palpitations or headaches or edema  No side effects to medicines  BP Readings from Last 3 Encounters:  03/04/23 118/66  01/28/23 130/74  01/18/23 106/61    Losartan 50 mg daily  Atenolol 25 mg daily   Pulse Readings from Last 3 Encounters:  03/04/23 89  01/28/23 65  01/18/23 62     More issues with myofascial pain syndrome   Lab Results  Component Value Date   ANA Negative 02/25/2023   RF 10.6 02/25/2023   Lab Results  Component Value Date   ESRSEDRATE 7 02/25/2023   Lab Results  Component Value Date   CKTOTAL 238 (H) 02/25/2023   Not having muscle pain  Just joint pain   Hyperlipidemia Lab Results  Component Value Date   CHOL 128 02/25/2023   CHOL 107 02/12/2022   CHOL 182 12/05/2020   Lab Results  Component Value Date   HDL 53 02/25/2023  HDL 21.40 (L) 02/12/2022   HDL 48.80 12/05/2020   Lab Results  Component Value Date   LDLCALC 54 02/25/2023   LDLCALC 54 02/12/2022   LDLCALC 98 12/05/2020   Lab Results  Component Value Date   TRIG 121 02/25/2023   TRIG 156.0 (H) 02/12/2022   TRIG 177.0 (H) 12/05/2020   Lab Results  Component Value Date   CHOLHDL 2.4 02/25/2023   CHOLHDL 5 02/12/2022   CHOLHDL 4 12/05/2020   Lab Results  Component Value Date   LDLDIRECT 212.8 07/02/2006   Crestor 40 mg daily  Aortic atherosclerosis known    Prediabetes Lab Results  Component Value Date   HGBA1C 6.5 02/12/2022   Diet is fair  Hard to eat around a work schedule and house full of kids       Patient Active Problem List   Diagnosis Date Noted   Clavicle pain 01/28/2023   Joint pain 01/28/2023   Carotid atherosclerosis 10/22/2022   Chronic cerebrovascular accident (CVA) 09/14/2022   Headache 09/05/2022   Dizziness 08/07/2022   Onychomycosis 04/13/2022   History of breast cancer 09/12/2021   Colon cancer screening  12/12/2020   Medicare annual wellness visit, subsequent 08/28/2017   Estrogen deficiency 08/28/2017   Prediabetes 06/13/2015   Obesity 06/13/2015   Routine general medical examination at a health care facility 09/14/2011   STRESS REACTION, ACUTE, WITH EMOTIONAL DISTURBANCE 05/12/2008   HYPERCHOLESTEROLEMIA 04/21/2007   Essential hypertension 04/21/2007   UTI'S, RECURRENT 04/21/2007   Diffuse myofascial pain syndrome 04/21/2007   RETRACTION/LAG, EYELID 09/20/2006   PARESTHESIA 09/20/2006   MRI, BRAIN, ABNORMAL 09/20/2006   Past Medical History:  Diagnosis Date   Anxiety reaction    Back pain    Breast cancer (HCC)    CVA (cerebrovascular accident) (HCC)    Fibromyalgia    History of recurrent UTIs    HLD (hyperlipidemia)    Hypertension    Past Surgical History:  Procedure Laterality Date   ABDOMINAL HYSTERECTOMY     BREAST LUMPECTOMY     OVARIAN CYST REMOVAL     TEE WITHOUT CARDIOVERSION N/A 01/18/2023   Procedure: TRANSESOPHAGEAL ECHOCARDIOGRAM;  Surgeon: Thurmon Fair, MD;  Location: [facility];  Service: Cardiovascular;  Laterality: N/A;   TONSILLECTOMY     Social History   Tobacco Use   Smoking status: Never   Smokeless tobacco: Never  Vaping Use   Vaping status: Never Used  Substance Use Topics   Alcohol use: Yes    Alcohol/week: 0.0 standard drinks of alcohol    Comment: rare   Drug use: No   Family History  Problem Relation Age of Onset   Heart failure Mother    Thyroid nodules Mother    Heart disease Mother    Coronary artery disease Mother    Hypertension Father    Dementia Father    Alcohol abuse Brother    Hyperlipidemia Daughter    Hypertension Daughter    Cancer Maternal Aunt        breast   Breast cancer Maternal Aunt    Cancer Paternal Aunt        breast   Breast cancer Paternal Aunt    Allergies  Allergen Reactions   Amoxicillin-Pot Clavulanate Diarrhea   Doxycycline Nausea And Vomiting   Nitrofurantoin Hives   Current  Outpatient Medications on File Prior to Visit  Medication Sig Dispense Refill   acetaminophen (TYLENOL) 325 MG tablet Take 650-975 mg by mouth every 6 (six) hours as  needed for moderate pain.     aspirin EC 81 MG tablet Take 81 mg by mouth daily. Swallow whole.     atenolol (TENORMIN) 25 MG tablet Take 1 tablet (25 mg total) by mouth daily. 90 tablet 3   cholecalciferol (VITAMIN D3) 25 MCG (1000 UNIT) tablet Take 1,000 Units by mouth daily.     Cyanocobalamin (B-12 PO) Take 1 capsule by mouth daily.     losartan (COZAAR) 50 MG tablet Take 1 tablet (50 mg total) by mouth daily. 90 tablet 3   Multiple Vitamins-Minerals (WOMENS MULTI PO) Take 1 tablet by mouth daily.       rosuvastatin (CRESTOR) 40 MG tablet Take 1 tablet (40 mg total) by mouth daily. TAKE 1 TABLET BY MOUTH ONCE A DAY 90 tablet 3   SUMAtriptan (IMITREX) 50 MG tablet TAKE 1 TABLET EVERY 2 HOURS AS NEEDED FOR MIGRAINE--MAX 2 DOSES PER DAY 10 tablet 11   trimethoprim (TRIMPEX) 100 MG tablet Take 1 tablet (100 mg total) by mouth daily. 90 tablet 3   No current facility-administered medications on file prior to visit.    Review of Systems  Constitutional:  Positive for fatigue. Negative for activity change, appetite change, fever and unexpected weight change.  HENT:  Negative for congestion, ear pain, rhinorrhea, sinus pressure and sore throat.   Eyes:  Negative for pain, redness and visual disturbance.  Respiratory:  Negative for cough, shortness of breath and wheezing.   Cardiovascular:  Negative for chest pain and palpitations.  Gastrointestinal:  Negative for abdominal pain, blood in stool, constipation and diarrhea.  Endocrine: Negative for polydipsia and polyuria.  Genitourinary:  Negative for dysuria, frequency and urgency.  Musculoskeletal:  Negative for arthralgias, back pain and myalgias.  Skin:  Negative for pallor and rash.  Allergic/Immunologic: Negative for environmental allergies.  Neurological:  Negative for  dizziness, syncope and headaches.  Hematological:  Negative for adenopathy. Does not bruise/bleed easily.  Psychiatric/Behavioral:  Negative for decreased concentration and dysphoric mood. The patient is not nervous/anxious.        Work is very stressful  Not a lot of time for self care        Objective:   Physical Exam Constitutional:      General: She is not in acute distress.    Appearance: Normal appearance. She is well-developed. She is obese. She is not ill-appearing or diaphoretic.  HENT:     Head: Normocephalic and atraumatic.     Right Ear: Tympanic membrane, ear canal and external ear normal.     Left Ear: Tympanic membrane, ear canal and external ear normal.     Nose: Nose normal. No congestion.     Mouth/Throat:     Mouth: Mucous membranes are moist.     Pharynx: Oropharynx is clear. No posterior oropharyngeal erythema.  Eyes:     General: No scleral icterus.    Extraocular Movements: Extraocular movements intact.     Conjunctiva/sclera: Conjunctivae normal.     Pupils: Pupils are equal, round, and reactive to light.  Neck:     Thyroid: No thyromegaly.     Vascular: No carotid bruit or JVD.  Cardiovascular:     Rate and Rhythm: Normal rate and regular rhythm.     Pulses: Normal pulses.     Heart sounds: Normal heart sounds.     No gallop.  Pulmonary:     Effort: Pulmonary effort is normal. No respiratory distress.     Breath sounds: Normal breath sounds. No  wheezing.     Comments: Good air exch Chest:     Chest wall: No tenderness.  Abdominal:     General: Bowel sounds are normal. There is no distension or abdominal bruit.     Palpations: Abdomen is soft. There is no mass.     Tenderness: There is no abdominal tenderness.     Hernia: No hernia is present.  Genitourinary:    Comments: Breast exam done by rad/onc  Musculoskeletal:        General: No tenderness. Normal range of motion.     Cervical back: Normal range of motion and neck supple. No rigidity. No  muscular tenderness.     Right lower leg: No edema.     Left lower leg: No edema.     Comments: No kyphosis   No acute joint changes   Lymphadenopathy:     Cervical: No cervical adenopathy.  Skin:    General: Skin is warm and dry.     Coloration: Skin is not pale.     Findings: No erythema or rash.     Comments: Solar lentigines diffusely   Neurological:     Mental Status: She is alert. Mental status is at baseline.     Cranial Nerves: No cranial nerve deficit.     Motor: No abnormal muscle tone.     Coordination: Coordination normal.     Gait: Gait normal.     Deep Tendon Reflexes: Reflexes are normal and symmetric. Reflexes normal.  Psychiatric:        Mood and Affect: Mood normal.        Cognition and Memory: Cognition and memory normal.           Assessment & Plan:   Problem List Items Addressed This Visit       Cardiovascular and Mediastinum   Carotid atherosclerosis    Continues follow up with cardiology  Blood pressure and cholesterol are controlled  Asa and crestor       Chronic cerebrovascular accident (CVA)    Continues crestor high dose, asa and blood pressure control      Essential hypertension    Better with addn of losartan bp in fair control at this time  BP Readings from Last 1 Encounters:  03/04/23 118/66   No changes needed Most recent labs reviewed  Disc lifstyle change with low sodium diet and exercise  Continue losartan 50 mg daily and atenolol 25 mg daily         Musculoskeletal and Integument   Diffuse myofascial pain syndrome    No clinical changes  Rheumatoid labs negative  CK total was up -= may be related to crestor (but pt does not notice any difference with crestor in terms of muscle pain)        Other   Colon cancer screening    GI referral for screening colonoscopy      Relevant Orders   Ambulatory referral to Gastroenterology   Estrogen deficiency    Dexa ordered       Relevant Orders   DG Bone Density    History of breast cancer    Doing well s/p treatment  Sees rad/onc in dec  Mammo utd 09/2022      HYPERCHOLESTEROLEMIA    With known vascular dz  Disc goals for lipids and reasons to control them Rev last labs with pt Rev low sat fat diet in detail HDL up to 53 LDL of 54 and normal trig   Continues  crestor 40 mg  Tolerates well  Unsure if this is why cpk is up?      Joint pain    Ana, RF and ESR normal       Obesity    Discussed how this problem influences overall health and the risks it imposes  Reviewed plan for weight loss with lower calorie diet (via better food choices (lower glycemic and portion control) along with exercise building up to or more than 30 minutes 5 days per week including some aerobic activity and strength training         Prediabetes    Lab Results  Component Value Date   HGBA1C 6.5 02/12/2022   disc imp of low glycemic diet and wt loss to prevent DM2   Aware she is on the edge for diabetes at this time       Routine general medical examination at a health care facility - Primary    Reviewed health habits including diet and exercise and skin cancer prevention Reviewed appropriate screening tests for age  Also reviewed health mt list, fam hx and immunization status , as well as social and family history   See HPI Labs reviewed and ordered Declines flu, prevnar 20, tetanus and shingrix imms currently (may consider tetanus and shingrix later at pharmacy) Mammogram utd 09/2022 (personal history of breast cancer) GI referral done for screening colonoscopy  Regular eye care in setting of family history of mac deg  Uses sun protection  PHQ 2 due to work stress /encouraged to consider retirement or cutting back hours

## 2023-03-04 NOTE — Patient Instructions (Addendum)
If you are interested in the new shingles vaccine (Shingrix) - call your local pharmacy to check on coverage and availability   You can inquire about tetanus shot as well   Try and get back to exercise  Work up to 5 days per week Add some strength training to your routine, this is important for bone and brain health and can reduce your risk of falls and help your body use insulin properly and regulate weight  Light weights, exercise bands , and internet videos are a good way to start  Yoga (chair or regular), machines , floor exercises or a gym with machines are also good options    Silver sneakers program is great also   For diabetes prevention Try to get most of your carbohydrates from produce (with the exception of white potatoes) and whole grains Eat less bread/pasta/rice/snack foods/cereals/sweets and other items from the middle of the grocery store (processed carbs)    Call to schedule colonoscopy   [facility]  [phone]   Call to schedule your DEXA  You have an order for:  []   2D Mammogram  []   3D Mammogram  [x]   Bone Density     Please call for appointment:   []   [facility]  [address] [place] [address]  [phone]  []   [facility])   [address]  [place], [place] [address]  [phone]  [x]   [facility]      [address] [place], [place]        [phone]         []   [facility]  [address] [place], [place]  [phone]  []  [facility]   [address]   [place], [place] [address]  [phone]  []  [facility]  [address] [place], [place] [address] [phone]    Make sure to wear two piece clothing  No lotions powders or deodorants the day of the  appointment Make sure to bring picture ID and insurance card.  Bring list of medications you are currently taking including any supplements.   Schedule your screening mammogram through MyChart!   Select [place] imaging sites can now be scheduled through MyChart.  Log into your MyChart account.  Go to 'Visit' (or 'Appointments' if  on mobile App) --> Schedule an  Appointment  Under 'Select a Reason for Visit' choose the Mammogram  Screening option.  Complete the pre-visit questions  and select the time and place that  best fits your schedule

## 2023-03-04 NOTE — Assessment & Plan Note (Signed)
Doing well s/p treatment  Sees rad/onc in dec  Mammo utd 09/2022

## 2023-03-04 NOTE — Assessment & Plan Note (Signed)
Amy Mills, RF and ESR normal

## 2023-03-04 NOTE — Assessment & Plan Note (Signed)
GI referral for screening colonoscopy.

## 2023-03-04 NOTE — Assessment & Plan Note (Signed)
Dexa ordered

## 2023-03-05 DIAGNOSIS — R748 Abnormal levels of other serum enzymes: Secondary | ICD-10-CM | POA: Insufficient documentation

## 2023-03-05 NOTE — Assessment & Plan Note (Addendum)
Lab Results  Component Value Date   CKTOTAL 238 (H) 02/25/2023   This is mildly elevated Takes statin but no myalgias from that  Cardiology wants to observe for muscle pain and re check this in 12 weeks  Will schedule that

## 2023-03-05 NOTE — Addendum Note (Signed)
Addended by: Roxy Manns A on: 03/05/2023 08:22 PM   Modules accepted: Orders

## 2023-03-08 ENCOUNTER — Telehealth: Payer: Self-pay | Admitting: *Deleted

## 2023-03-08 ENCOUNTER — Telehealth: Payer: Self-pay | Admitting: Family Medicine

## 2023-03-08 NOTE — Telephone Encounter (Signed)
Left voicemail for patient to return call to office. 

## 2023-03-08 NOTE — Telephone Encounter (Signed)
Addressed through separate phone note  

## 2023-03-08 NOTE — Telephone Encounter (Signed)
Patient notified of Dr. Lucretia Roers comments lab appointment scheduled.

## 2023-03-08 NOTE — Telephone Encounter (Signed)
-----   Message from Dudley sent at 03/05/2023  8:22 PM EDT ----- Please let pt know that I checked in with cardiology about the CK level  They want to watch for any new muscle pain and re check it in 12 weeks Please schedule non fasting lab for CK in 12 weeks

## 2023-03-08 NOTE — Telephone Encounter (Signed)
Pt called stated she was returning a call and would like a call back if possible

## 2023-03-19 ENCOUNTER — Ambulatory Visit: Payer: Medicare Other | Attending: Cardiology | Admitting: Cardiology

## 2023-03-19 ENCOUNTER — Encounter: Payer: Self-pay | Admitting: Cardiology

## 2023-03-19 VITALS — BP 130/76 | HR 71 | Ht 62.0 in | Wt 185.0 lb

## 2023-03-19 DIAGNOSIS — R002 Palpitations: Secondary | ICD-10-CM

## 2023-03-19 DIAGNOSIS — I639 Cerebral infarction, unspecified: Secondary | ICD-10-CM | POA: Diagnosis not present

## 2023-03-19 NOTE — Progress Notes (Signed)
  Electrophysiology Office Note:   Date:  03/19/2023  ID:  Amy Mills, DOB 05/26/1950, MRN [id number]  Primary Cardiologist: None Electrophysiologist: Nobie Putnam, MD      History of Present Illness:   Amy Mills is a 72 y.o. female with h/o CVA, carotid atherosclerosis, hypertension and palpitations who is being seen today for evaluation for loop recorder implant at the request of Dr. Jens Som.  Patient has had occasional palpitations since she was 72 years old.  She estimates that she has episodes of fluttering in her chest occurring twice per month. She had MRI of her brain in May 2024 which showed chronic left cerebellar infarct.  Cardiology and neurology have evaluated the patient and requested loop recorder implant given her history of stroke in the background of intermittent palpitations for many years.  Review of systems complete and found to be negative unless listed in HPI.   EP Information / Studies Reviewed:    EKG is ordered today. Personal review as below.  EKG Interpretation Date/Time:  Tuesday March 19 2023 13:42:35 EST Ventricular Rate:  71 PR Interval:  166 QRS Duration:  80 QT Interval:  384 QTC Calculation: 417 R Axis:   12  Text Interpretation: Normal sinus rhythm  When compared with ECG of 11-Apr-2004 16:28, there has been no significant change.  Confirmed by Nobie Putnam [id number]) on 03/19/2023 2:21:36 PM   Echo 01/18/2023: Normal LV size and function.  LVEF 50 to 55%. Normal RV size and function. No left atrial appendage thrombus. 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  Mild MR. Severe mobile atheroma in the descending aorta.  Physical Exam:   VS:  BP 130/76   Pulse 71   Ht 5\' 2"  (1.575 m)   Wt 185 lb (83.9 kg)   LMP 05/08/1995   SpO2 98%   BMI 33.84 kg/m    Wt Readings from Last 3 Encounters:  03/19/23 185 lb (83.9 kg)  03/04/23 185 lb (83.9 kg)  01/28/23 184 lb 8 oz (83.7 kg)     GEN: Well nourished,  well developed in no acute distress NECK: No JVD; No carotid bruits CARDIAC: Normal rate and regular rhythm. RESPIRATORY:  Clear to auscultation without rales, wheezing or rhonchi  ABDOMEN: Soft, non-tender, non-distended EXTREMITIES:  No edema; No deformity   ASSESSMENT AND PLAN:   [name] is a 72 y.o. female with h/o CVA, carotid atherosclerosis, hypertension and palpitations who is being seen today for evaluation for loop recorder implant at the request of Dr. Jens Som.  Patient has a history of palpitations and stroke. No known history of atrial fibrillation. It is reasonable to implant loop recorder at the request of neurology and general cardiology.   #History of cerebellar stroke #Palpitations -Explained risks, benefits, and alternatives to loop recorder implantation, including but not limited to bleeding, infection, damage to heart or lungs.  Pt verbalized understanding and would like to discuss with her husband.   Signed, Nobie Putnam, MD

## 2023-03-19 NOTE — Patient Instructions (Addendum)
Medication Instructions:  Your physician recommends that you continue on your current medications as directed. Please refer to the Current Medication list given to you today.  *If you need a refill on your cardiac medications before your next appointment, please call your pharmacy*  Follow-Up: At [facility], you and your health needs are our priority.  As part of our continuing mission to provide you with exceptional heart care, we have created designated Provider Care Teams.  These Care Teams include your primary Cardiologist (physician) and Advanced Practice Providers (APPs -  Physician Assistants and Nurse Practitioners) who all work together to provide you with the care you need, when you need it.  Your next appointment:   12/3 at 8:00am   Provider:   Nobie Putnam, MD

## 2023-03-21 ENCOUNTER — Encounter: Payer: Self-pay | Admitting: Cardiology

## 2023-04-09 ENCOUNTER — Ambulatory Visit: Payer: Medicare Other | Admitting: Cardiology

## 2023-04-15 ENCOUNTER — Encounter: Payer: Self-pay | Admitting: Cardiology

## 2023-04-15 ENCOUNTER — Ambulatory Visit: Payer: Medicare Other | Attending: Cardiology | Admitting: Cardiology

## 2023-04-15 ENCOUNTER — Other Ambulatory Visit: Payer: Self-pay

## 2023-04-15 VITALS — BP 132/74 | HR 64 | Ht 62.0 in | Wt 188.0 lb

## 2023-04-15 DIAGNOSIS — I639 Cerebral infarction, unspecified: Secondary | ICD-10-CM

## 2023-04-15 NOTE — Progress Notes (Signed)
Electrophysiology Office Note:   Date:  04/15/2023  ID:  Amy Mills, DOB 1950/11/25, MRN [id number]  Primary Cardiologist: None Electrophysiologist: Nobie Putnam, MD      History of Present Illness:   Amy Mills is a 72 y.o. female with h/o CVA, carotid atherosclerosis, hypertension and palpitations who is being seen today for routine electrophysiology followup.   Since last being seen in our clinic the patient reports doing relatively well.  She denies chest pain, palpitations, dyspnea, PND, orthopnea, nausea, vomiting, dizziness, syncope, edema, weight gain, or early satiety.   Review of systems complete and found to be negative unless listed in HPI.   EP Information / Studies Reviewed:    EKG is ordered today. Personal review as below. Sinus rhythm.      Echo 01/18/2023: Normal LV size and function.  LVEF 50 to 55%. Normal RV size and function. No left atrial appendage thrombus. 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  Mild MR. Severe mobile atheroma in the descending aorta.   Physical Exam:   VS:  BP 132/74   Pulse 64   Ht 5\' 2"  (1.575 m)   Wt 188 lb (85.3 kg)   LMP 05/08/1995   SpO2 96%   BMI 34.39 kg/m    Wt Readings from Last 3 Encounters:  04/15/23 188 lb (85.3 kg)  03/19/23 185 lb (83.9 kg)  03/04/23 185 lb (83.9 kg)     GEN: Well nourished, well developed in no acute distress NECK: No JVD.  CARDIAC: Normal rate and regular rhythm.  RESPIRATORY: Normal work breathing.  ABDOMEN: Soft, non-tender, non-distended EXTREMITIES:  No edema; No deformity   ASSESSMENT AND PLAN:   Amy Mills is a 72 y.o. female with h/o CVA, carotid atherosclerosis, hypertension and palpitations who is being seen today for evaluation for loop recorder implant at the request of Dr. Jens Som.   Patient has a history of palpitations and stroke. No known history of atrial fibrillation. It is reasonable to implant loop recorder at the request of  neurology and general cardiology.    #History of cerebellar stroke, cryptogenic: #Palpitations: -Explained risks, benefits, and alternatives to loop recorder implantation, including but not limited to bleeding, infection, damage to heart or lungs.  Pt verbalized understanding and would like to proceed.  SURGEON:  Nobie Putnam, MD     PREPROCEDURE DIAGNOSIS:  Cryptogenic stroke    POSTPROCEDURE DIAGNOSIS: Cryptogenic stroke     PROCEDURES:   1. Implantable loop recorder implantation    INTRODUCTION:  Amy Mills presents with a history of cryptogenic stroke The costs of loop recorder monitoring have been discussed with the patient.    DESCRIPTION OF PROCEDURE:  Informed written consent was obtained.   Time Out Completed with RN    The patient required no sedation for the procedure today.  Mapping over the patient's chest was performed to identify the area where electrograms were most prominent for ILR recording.  This area was found to be the left parasternal region over the 4th intercostal space. The patients left chest was therefore prepped and draped in the usual sterile fashion. The skin overlying the left parasternal region was infiltrated with lidocaine for local analgesia.  A 0.5-cm incision was made over the left parasternal region over the 3rd intercostal space.  A subcutaneous ILR pocket was fashioned using a combination of sharp and blunt dissection.  A [employer] LUX implantable loop recorder (serial # [id number]) was then placed into the pocket  R waves were very prominent and measuring  1.81mV .  Steri- Strips and a sterile dressing were then applied.  There were no early apparent complications.     CONCLUSIONS:   1. Successful implantation of a implantable loop recorder for a history of cryptogenic stroke  2. No early apparent complications.   Follow up with EP as needed.   Signed, Nobie Putnam, MD

## 2023-04-15 NOTE — Patient Instructions (Addendum)
 Medication Instructions:  Your physician recommends that you continue on your current medications as directed. Please refer to the Current Medication list given to you today.  Labwork: None ordered.  Testing/Procedures: None ordered.  Follow-Up: As needed with Dr. Jimmey Ralph  Implantable Loop Recorder Placement, Care After This sheet gives you information about how to care for yourself after your procedure. Your health care provider may also give you more specific instructions. If you have problems or questions, contact your health care provider. What can I expect after the procedure? After the procedure, it is common to have: Soreness or discomfort near the incision. Some swelling or bruising near the incision.  Follow these instructions at home: Incision care  Monitor your cardiac device site for redness, swelling, and drainage. Call the device clinic at [phone] if you experience these symptoms or fever/chills.  Keep the large square bandage on your site for 24 hours and then you may remove it yourself. Keep the steri-strips underneath in place.   You may shower after 72 hours / 3 days from your procedure with the steri-strips in place. They will usually fall off on their own, or may be removed after 10 days. Pat dry.   Avoid lotions, ointments, or perfumes over your incision until it is well-healed.  Please do not submerge in water until your site is completely healed.   Your device is MRI compatible.   Remote monitoring is used to monitor your cardiac device from home. This monitoring is scheduled every month by our office. It allows [place] to keep an eye on the function of your device to ensure it is working properly.  If your wound site starts to bleed apply pressure.       If you have any questions/concerns please call the device clinic at [phone].  Activity  Return to your normal activities.  General instructions Follow instructions from your health care  provider about how to manage your implantable loop recorder and transmit the information. Learn how to activate a recording if this is necessary for your type of device. You may go through a metal detection gate, and you may let someone hold a metal detector over your chest. Show your ID card if needed. Do not have an MRI unless you check with your health care provider first. Take over-the-counter and prescription medicines only as told by your health care provider. Keep all follow-up visits as told by your health care provider. This is important. Contact a health care provider if: You have redness, swelling, or pain around your incision. You have a fever. You have pain that is not relieved by your pain medicine. You have triggered your device because of fainting (syncope) or because of a heartbeat that feels like it is racing, slow, fluttering, or skipping (palpitations). Get help right away if you have: Chest pain. Difficulty breathing. Summary After the procedure, it is common to have soreness or discomfort near the incision. Change your dressing as told by your health care provider. Follow instructions from your health care provider about how to manage your implantable loop recorder and transmit the information. Keep all follow-up visits as told by your health care provider. This is important. This information is not intended to replace advice given to you by your health care provider. Make sure you discuss any questions you have with your health care provider. Document Released: 04/04/2015 Document Revised: 06/08/2017 Document Reviewed: 06/08/2017 Elsevier Patient Education  2020 [employer].

## 2023-04-17 ENCOUNTER — Telehealth: Payer: Self-pay | Admitting: Cardiology

## 2023-04-17 NOTE — Telephone Encounter (Signed)
Pt states that she needs to speak with the nurse Carlyle. Please advise

## 2023-04-17 NOTE — Telephone Encounter (Signed)
Left message for patient to call back  

## 2023-04-17 NOTE — Telephone Encounter (Signed)
Patient had a loop recorder placed on Monday 12/19. It was very painful when the device was inserted. Patient does have a lot of breast tissue. She states that all day on Monday she staying in pain. Yesterday she was still in pain but felt a little bit better. Today pain has worsened again. She reports pain throughout her whole breast and into her back. She states that she has been taking Tylenol which helps but does not alleviate the pain completely. She states that she is having to sleep in a bra because it is too painful when she does not wear one. She states that the actual site looks good with no redness, swelling or bruising. Advised that I would send a message to Dr. Jimmey Ralph for further recommendations.

## 2023-04-17 NOTE — Telephone Encounter (Signed)
Patient is returning phone call.  °

## 2023-04-17 NOTE — Telephone Encounter (Signed)
Spoke with the patient and she has been taking 250mg , 2 tablets every 6 hours. Advised her that she can take up to 1000 mg every 6-8 hours. Advised to give it some time to heal and let us know if increased tylenol doesn't help and she is still having pain after a few more days.

## 2023-04-23 ENCOUNTER — Other Ambulatory Visit: Payer: Self-pay | Admitting: Family Medicine

## 2023-04-29 DIAGNOSIS — C50311 Malignant neoplasm of lower-inner quadrant of right female breast: Secondary | ICD-10-CM | POA: Diagnosis not present

## 2023-05-20 ENCOUNTER — Ambulatory Visit (INDEPENDENT_AMBULATORY_CARE_PROVIDER_SITE_OTHER): Payer: Medicare Other

## 2023-05-20 DIAGNOSIS — I639 Cerebral infarction, unspecified: Secondary | ICD-10-CM | POA: Diagnosis not present

## 2023-05-20 LAB — CUP PACEART REMOTE DEVICE CHECK
Date Time Interrogation Session: 20250113003500
Implantable Pulse Generator Implant Date: 20241209
Pulse Gen Serial Number: 126424

## 2023-05-27 ENCOUNTER — Telehealth: Payer: Self-pay

## 2023-05-27 NOTE — Telephone Encounter (Signed)
Reviewed with A. Tillery, PA--C while Dr. Jimmey Ralph is out of office today. He confirms AFIB/Flutter with elevated V rate control. Longest episode 10hours but actually appears ongoing at presenting this am. Patient to send another transmission once she gets home to see if continues.  Afib clinic appt has been made for tomorrow 1/21 at 9am to start [facility] and review rates.

## 2023-05-27 NOTE — Telephone Encounter (Signed)
1/18: ILR alert for AF Event occurred 1/18 @ 21:28, duration 10hrs , mean HR 93  1/20: 6 tachy events, longest duration 45sec, HR's 161-182, likely AF with RVR 2 additional AF events  ILR Implanted 04/15/23 for cryptogenic stroke.  NEW EVENTS , NOT ON AN OAC.   Spoke with patient. She has felt significantly more fatigued over last 48 hours.  Denies any other symptoms. Will discuss with PA and Dr. Jimmey Ralph re: getting in asap with AF clinic to start [facility]. Patient is to also, send me a manual transmission when she gets home today so that I can recheck her presenting rhythm as presenting appears AFlutter- 163bpm W/ RVR.

## 2023-05-28 ENCOUNTER — Ambulatory Visit (HOSPITAL_COMMUNITY)
Admission: RE | Admit: 2023-05-28 | Discharge: 2023-05-28 | Disposition: A | Discharge status: Home / Self Care | Payer: Medicare Other | Source: Ambulatory Visit | Attending: Internal Medicine | Admitting: Internal Medicine

## 2023-05-28 VITALS — BP 144/76 | HR 66 | Ht 62.0 in | Wt 187.8 lb

## 2023-05-28 DIAGNOSIS — Z8673 Personal history of transient ischemic attack (TIA), and cerebral infarction without residual deficits: Secondary | ICD-10-CM | POA: Insufficient documentation

## 2023-05-28 DIAGNOSIS — D6869 Other thrombophilia: Secondary | ICD-10-CM | POA: Diagnosis not present

## 2023-05-28 DIAGNOSIS — I10 Essential (primary) hypertension: Secondary | ICD-10-CM | POA: Insufficient documentation

## 2023-05-28 DIAGNOSIS — I4892 Unspecified atrial flutter: Secondary | ICD-10-CM | POA: Insufficient documentation

## 2023-05-28 DIAGNOSIS — Z7901 Long term (current) use of anticoagulants: Secondary | ICD-10-CM | POA: Insufficient documentation

## 2023-05-28 DIAGNOSIS — I4891 Unspecified atrial fibrillation: Secondary | ICD-10-CM | POA: Insufficient documentation

## 2023-05-28 DIAGNOSIS — I6529 Occlusion and stenosis of unspecified carotid artery: Secondary | ICD-10-CM | POA: Diagnosis not present

## 2023-05-28 DIAGNOSIS — I480 Paroxysmal atrial fibrillation: Secondary | ICD-10-CM | POA: Diagnosis not present

## 2023-05-28 MED ORDER — APIXABAN 5 MG PO TABS
5.0000 mg | ORAL_TABLET | Freq: Two times a day (BID) | ORAL | 3 refills | Status: DC
Start: 2023-05-28 — End: 2023-12-30

## 2023-05-28 NOTE — Patient Instructions (Addendum)
Stop aspirin  Start Eliquis 5mg  twice a day    Beginning in 2025, the prescription drug law requires all Medicare prescription drug plans (Medicare Part D plans)--including both standalone Medicare prescription drug plans and Medicare Advantage plans with prescription drug coverage--to offer Part D enrollees the option to pay out-of-pocket prescription drug costs in the form of monthly payments instead of all at once at the pharmacy. This program, called the Medicare Prescription Payment Plan, will be helpful for people with high cost sharing earlier in the plan year by spreading out those expenses over the course of the plan year.  What is the Medicare Prescription Payment Plan? The Medicare Prescription Payment Plan is a new payment option created under the Inflation Reduction Act that requires Part D plan sponsors to provide their enrollees with the option to pay out-of-pocket prescription drug costs in the form of monthly payments over the course of the plan year instead of all at once to the pharmacy. The program begins on May 08, 2023.  Program participants will pay $0 to the pharmacy for covered Part D drugs, and Part D plan sponsors will then bill program participants monthly for any cost sharing they incur while in the program. Pharmacies will be paid in full by the Part D sponsor in accordance with Part D prompt payment requirements  If there are issues with costs of your medications with new medicare changes ---call your DRUG INSURANCE to discuss payment plan to reduce your monthly costs.

## 2023-05-28 NOTE — Progress Notes (Signed)
Primary Care Physician: Tower, Audrie Gallus, MD Primary Cardiologist: None Electrophysiologist: Nobie Putnam, MD     Referring Physician: Device clinic     Amy Mills is a 73 y.o. female with a history of CVA, carotid atherosclerosis, HTN, and paroxysmal atrial fibrillation who presents for consultation in the [facility]ibr[facility]. ILR implanted by Dr. Jimmey Ralph on 04/15/23 for cryptogenic stroke. Device alert 1/18 for what appears to be ongoing atrial flutter with RVR. Patient has a CHADS2VASC score of 5.  On evaluation today, she is currently in NSR. She noted to feel tired when day of alert took place. On 1/18. She did not feel any episodes yesterday.   Today, she denies symptoms of palpitations, chest pain, shortness of breath, orthopnea, PND, lower extremity edema, dizziness, presyncope, syncope, snoring, daytime somnolence, bleeding, or neurologic sequela. The patient is tolerating medications without difficulties and is otherwise without complaint today.    Atrial Fibrillation Risk Factors:  she does have symptoms or diagnosis of sleep apnea.  she has a BMI of Body mass index is 34.35 kg/m.Marland Kitchen Filed Weights   05/28/23 0821  Weight: 85.2 kg    Current Outpatient Medications  Medication Sig Dispense Refill   acetaminophen (TYLENOL) 325 MG tablet Take 650-975 mg by mouth as needed for moderate pain (pain score 4-6).     apixaban (ELIQUIS) 5 MG TABS tablet Take 1 tablet (5 mg total) by mouth 2 (two) times daily. 60 tablet 3   atenolol (TENORMIN) 25 MG tablet TAKE ONE TABLET BY MOUTH ONCE A DAY 90 tablet 2   cholecalciferol (VITAMIN D3) 25 MCG (1000 UNIT) tablet Take 1,000 Units by mouth daily.     Cyanocobalamin (B-12 PO) Take 1 capsule by mouth daily.     losartan (COZAAR) 50 MG tablet Take 1 tablet (50 mg total) by mouth daily. 90 tablet 3   Multiple Vitamins-Minerals (WOMENS MULTI PO) Take 1 tablet by mouth daily.       rosuvastatin (CRESTOR) 40 MG tablet  Take 1 tablet (40 mg total) by mouth daily. TAKE 1 TABLET BY MOUTH ONCE A DAY 90 tablet 3   SUMAtriptan (IMITREX) 50 MG tablet TAKE 1 TABLET EVERY 2 HOURS AS NEEDED FOR MIGRAINE--MAX 2 DOSES PER DAY 10 tablet 11   trimethoprim (TRIMPEX) 100 MG tablet TAKE ONE TABLET BY MOUTH ONCE A DAY 90 tablet 2   No current facility-administered medications for this encounter.    Atrial Fibrillation Management history:  Previous antiarrhythmic drugs: none Previous cardioversions: none Previous ablations: none Anticoagulation history: none   ROS- All systems are reviewed and negative except as per the HPI above.  Physical Exam: BP (!) 144/76   Pulse 66   Ht 5\' 2"  (1.575 m)   Wt 85.2 kg   LMP 05/08/1995   BMI 34.35 kg/m   GEN: Well nourished, well developed in no acute distress NECK: No JVD; No carotid bruits CARDIAC: Regular rate and rhythm, no murmurs, rubs, gallops RESPIRATORY:  Clear to auscultation without rales, wheezing or rhonchi  ABDOMEN: Soft, non-tender, non-distended EXTREMITIES:  No edema; No deformity   EKG today demonstrates  Vent. rate 66 BPM PR interval 172 ms QRS duration 78 ms QT/QTcB 388/406 ms P-R-T axes 16 23 26  Normal sinus rhythm Low voltage QRS Cannot rule out Anterior infarct , age undetermined Abnormal ECG When compared with ECG of 15-Apr-2023 08:01, PREVIOUS ECG IS PRESENT  Echo 01/18/23 demonstrated  1. Left ventricular ejection fraction, by estimation, is 50 to  55%. The  left ventricle has low normal function. The left ventricle has no regional  wall motion abnormalities.   2. Right ventricular systolic function is normal. The right ventricular  size is normal.   3. No left atrial/left atrial appendage thrombus was detected.   4. There appears to be a very small torn chorda tendina to the anterior  mitral leaflet, with prolapse of a very small segment of the edge of A2..  The mitral valve is degenerative. Mild mitral valve regurgitation. No   evidence of mitral stenosis.   5. The aortic valve is tricuspid. Aortic valve regurgitation is trivial.  No aortic stenosis is present.   6. There is Severe, mobile (Grade V) layered and atheroma plaque  involving the descending aorta. Plaque thickness is up to 6 mm. In the  aortic arch there is moderate protruding plaque., up to 4.5 mm thickness.   7. Agitated saline contrast bubble study was negative, with no evidence  of any interatrial shunt.   ASSESSMENT & PLAN CHA2DS2-VASc Score = 5  The patient's score is based upon: CHF History: 0 HTN History: 1 Diabetes History: 0 Stroke History: 2 Vascular Disease History: 0 Age Score: 1 Gender Score: 1       ASSESSMENT AND PLAN: Paroxysmal Atrial Fibrillation (ICD10:  I48.0) / atrial flutter The patient's CHA2DS2-VASc score is 5, indicating a 7.2% annual risk of stroke.    She is currently in NSR. Education provided about Afib. Discussion about medication treatments and ablation going forward if indicated. After discussion, we will proceed with conservative observation at this time with follow up in 1 month to reassess ILR. Rhythm monitoring device recommended. If she has increased burden noted in 1 month, will discuss again rhythm control options. Would also likely transition away from atenolol to a more selective agent.   Secondary Hypercoagulable State (ICD10:  D68.69) The patient is at significant risk for stroke/thromboembolism based upon her CHA2DS2-VASc Score of 5.  Start Apixaban (Eliquis).  We discussed risks vs benefits of anticoagulation and after discussion patient wants to start [facility]. Stop ASA. Begin Eliquis 5 mg BID.   Follow up 1 month to assess burden and draw CBC.    Lake Bells, PA-C  Afib Clinic [facility] [address] [place], [place] [address] [phone]

## 2023-05-31 ENCOUNTER — Other Ambulatory Visit: Payer: Medicare Other

## 2023-06-20 ENCOUNTER — Ambulatory Visit (INDEPENDENT_AMBULATORY_CARE_PROVIDER_SITE_OTHER): Payer: Medicare Other

## 2023-06-20 DIAGNOSIS — I639 Cerebral infarction, unspecified: Secondary | ICD-10-CM | POA: Diagnosis not present

## 2023-06-24 LAB — CUP PACEART REMOTE DEVICE CHECK
Date Time Interrogation Session: 20250217054300
Implantable Pulse Generator Implant Date: 20241209
Pulse Gen Serial Number: 126424

## 2023-07-02 ENCOUNTER — Ambulatory Visit (HOSPITAL_COMMUNITY): Payer: Medicare Other | Admitting: Internal Medicine

## 2023-07-02 NOTE — Progress Notes (Signed)
 Bsx Loop Recorder

## 2023-07-08 ENCOUNTER — Ambulatory Visit (HOSPITAL_COMMUNITY)
Admission: RE | Admit: 2023-07-08 | Discharge: 2023-07-08 | Disposition: A | Discharge status: Home / Self Care | Payer: Medicare Other | Source: Ambulatory Visit | Attending: Internal Medicine | Admitting: Internal Medicine

## 2023-07-08 ENCOUNTER — Encounter (HOSPITAL_COMMUNITY): Payer: Self-pay | Admitting: Internal Medicine

## 2023-07-08 VITALS — BP 130/76 | HR 69 | Ht 62.0 in | Wt 185.2 lb

## 2023-07-08 DIAGNOSIS — I480 Paroxysmal atrial fibrillation: Secondary | ICD-10-CM | POA: Diagnosis not present

## 2023-07-08 DIAGNOSIS — I4892 Unspecified atrial flutter: Secondary | ICD-10-CM | POA: Diagnosis not present

## 2023-07-08 DIAGNOSIS — I4891 Unspecified atrial fibrillation: Secondary | ICD-10-CM | POA: Diagnosis not present

## 2023-07-08 DIAGNOSIS — Z79899 Other long term (current) drug therapy: Secondary | ICD-10-CM | POA: Diagnosis not present

## 2023-07-08 DIAGNOSIS — Z7901 Long term (current) use of anticoagulants: Secondary | ICD-10-CM | POA: Insufficient documentation

## 2023-07-08 DIAGNOSIS — Z8673 Personal history of transient ischemic attack (TIA), and cerebral infarction without residual deficits: Secondary | ICD-10-CM | POA: Insufficient documentation

## 2023-07-08 DIAGNOSIS — D6869 Other thrombophilia: Secondary | ICD-10-CM | POA: Diagnosis not present

## 2023-07-08 DIAGNOSIS — I10 Essential (primary) hypertension: Secondary | ICD-10-CM | POA: Diagnosis not present

## 2023-07-08 LAB — CBC
HCT: 40.5 % (ref 36.0–46.0)
Hemoglobin: 13.1 g/dL (ref 12.0–15.0)
MCH: 30.9 pg (ref 26.0–34.0)
MCHC: 32.3 g/dL (ref 30.0–36.0)
MCV: 95.5 fL (ref 80.0–100.0)
Platelets: 230 10*3/uL (ref 150–400)
RBC: 4.24 MIL/uL (ref 3.87–5.11)
RDW: 12.8 % (ref 11.5–15.5)
WBC: 6.2 10*3/uL (ref 4.0–10.5)
nRBC: 0 % (ref 0.0–0.2)

## 2023-07-08 NOTE — Progress Notes (Signed)
 Primary Care Physician: Tower, Audrie Gallus, MD Primary Cardiologist: None Electrophysiologist: Nobie Putnam, MD     Referring Physician: Device clinic     Amy Mills is a 73 y.o. female with a history of CVA, carotid atherosclerosis, HTN, and paroxysmal atrial fibrillation who presents for consultation in the [facility] Fibr[facility]. ILR implanted by Dr. Jimmey Ralph on 04/15/23 for cryptogenic stroke. Device alert 1/18 for what appears to be ongoing atrial flutter with RVR. Patient has a CHADS2VASC score of 5.  On evaluation today, she is currently in NSR. She noted to feel tired when day of alert took place. On 1/18. She did not feel any episodes yesterday.   On follow up 07/08/23, she is currently in NSR. Device review by Dr. Jimmey Ralph on 06/24/23 notes 1% Afib burden with 3 episodes. No missed doses of Eliquis 5 mg BID.   Today, she denies symptoms of palpitations, chest pain, shortness of breath, orthopnea, PND, lower extremity edema, dizziness, presyncope, syncope, snoring, daytime somnolence, bleeding, or neurologic sequela. The patient is tolerating medications without difficulties and is otherwise without complaint today.    Atrial Fibrillation Risk Factors:  she does have symptoms or diagnosis of sleep apnea.  she has a BMI of Body mass index is 33.87 kg/m.Marland Kitchen Filed Weights   07/08/23 1535  Weight: 84 kg     Current Outpatient Medications  Medication Sig Dispense Refill   acetaminophen (TYLENOL) 325 MG tablet Take 650-975 mg by mouth as needed for moderate pain (pain score 4-6).     apixaban (ELIQUIS) 5 MG TABS tablet Take 1 tablet (5 mg total) by mouth 2 (two) times daily. 60 tablet 3   atenolol (TENORMIN) 25 MG tablet TAKE ONE TABLET BY MOUTH ONCE A DAY 90 tablet 2   cholecalciferol (VITAMIN D3) 25 MCG (1000 UNIT) tablet Take 1,000 Units by mouth daily.     Cyanocobalamin (B-12 PO) Take 1 capsule by mouth daily.     losartan (COZAAR) 50 MG tablet Take 1 tablet  (50 mg total) by mouth daily. 90 tablet 3   Multiple Vitamins-Minerals (WOMENS MULTI PO) Take 1 tablet by mouth daily.       rosuvastatin (CRESTOR) 40 MG tablet Take 1 tablet (40 mg total) by mouth daily. TAKE 1 TABLET BY MOUTH ONCE A DAY 90 tablet 3   SUMAtriptan (IMITREX) 50 MG tablet TAKE 1 TABLET EVERY 2 HOURS AS NEEDED FOR MIGRAINE--MAX 2 DOSES PER DAY 10 tablet 11   trimethoprim (TRIMPEX) 100 MG tablet TAKE ONE TABLET BY MOUTH ONCE A DAY 90 tablet 2   No current facility-administered medications for this encounter.    Atrial Fibrillation Management history:  Previous antiarrhythmic drugs: none Previous cardioversions: none Previous ablations: none Anticoagulation history: Eliquis 5 mg BID   ROS- All systems are reviewed and negative except as per the HPI above.  Physical Exam: BP 130/76   Pulse 69   Ht 5\' 2"  (1.575 m)   Wt 84 kg   LMP 05/08/1995   BMI 33.87 kg/m   GEN- The patient is well appearing, alert and oriented x 3 today.   Neck - no JVD or carotid bruit noted Lungs- Clear to ausculation bilaterally, normal work of breathing Heart- Regular rate and rhythm, no murmurs, rubs or gallops, PMI not laterally displaced Extremities- no clubbing, cyanosis, or edema Skin - no rash or ecchymosis noted   EKG today demonstrates  Vent. rate 69 BPM PR interval 164 ms QRS duration 80 ms QT/QTcB  390/417 ms P-R-T axes 25 27 52 Normal sinus rhythm Possible Anterior infarct , age undetermined Abnormal ECG When compared with ECG of 28-May-2023 08:33, PREVIOUS ECG IS PRESENT  Echo 01/18/23 demonstrated  1. Left ventricular ejection fraction, by estimation, is 50 to 55%. The  left ventricle has low normal function. The left ventricle has no regional  wall motion abnormalities.   2. Right ventricular systolic function is normal. The right ventricular  size is normal.   3. No left atrial/left atrial appendage thrombus was detected.   4. There appears to be a very small torn  chorda tendina to the anterior  mitral leaflet, with prolapse of a very small segment of the edge of A2..  The mitral valve is degenerative. Mild mitral valve regurgitation. No  evidence of mitral stenosis.   5. The aortic valve is tricuspid. Aortic valve regurgitation is trivial.  No aortic stenosis is present.   6. There is Severe, mobile (Grade V) layered and atheroma plaque  involving the descending aorta. Plaque thickness is up to 6 mm. In the  aortic arch there is moderate protruding plaque., up to 4.5 mm thickness.   7. Agitated saline contrast bubble study was negative, with no evidence  of any interatrial shunt.   ASSESSMENT & PLAN CHA2DS2-VASc Score = 5  The patient's score is based upon: CHF History: 0 HTN History: 1 Diabetes History: 0 Stroke History: 2 Vascular Disease History: 0 Age Score: 1 Gender Score: 1       ASSESSMENT AND PLAN: Paroxysmal Atrial Fibrillation (ICD10:  I48.0) / atrial flutter The patient's CHA2DS2-VASc score is 5, indicating a 7.2% annual risk of stroke.    She is currently in NSR.  Device review by Dr. Jimmey Ralph 06/24/23 showed 1% Afib burden. Will continue to trend burden with serial device checks. Continue atenolol 25 mg daily.   Secondary Hypercoagulable State (ICD10:  D68.69) The patient is at significant risk for stroke/thromboembolism based upon her CHA2DS2-VASc Score of 5.  Start Apixaban (Eliquis).  Continue Eliquis 5 mg BID without interruption. Watchman device pamphlet given for patient to review. She notes having taken diclofenac previously for her arthritis pain and states tylenol is not helping. If patient is interested in discussing the procedure, she will contact us for follow up visit with Dr. Jimmey Ralph.    Follow up 1 year Afib clinic.    Lake Bells, PA-C  Afib Clinic [facility] [address] [place], [place] [address] [phone]

## 2023-07-22 ENCOUNTER — Ambulatory Visit (INDEPENDENT_AMBULATORY_CARE_PROVIDER_SITE_OTHER): Payer: Medicare Other

## 2023-07-22 DIAGNOSIS — I639 Cerebral infarction, unspecified: Secondary | ICD-10-CM | POA: Diagnosis not present

## 2023-07-22 LAB — CUP PACEART REMOTE DEVICE CHECK
Date Time Interrogation Session: 20250317004600
Implantable Pulse Generator Implant Date: 20241209
Pulse Gen Serial Number: 126424

## 2023-07-23 NOTE — Progress Notes (Signed)
 Carelink Summary Report / Loop Recorder

## 2023-07-23 NOTE — Addendum Note (Signed)
 Addended by: Elease Etienne A on: 07/23/2023 11:42 AM   Modules accepted: Orders

## 2023-08-06 ENCOUNTER — Other Ambulatory Visit (INDEPENDENT_AMBULATORY_CARE_PROVIDER_SITE_OTHER)

## 2023-08-06 DIAGNOSIS — R748 Abnormal levels of other serum enzymes: Secondary | ICD-10-CM

## 2023-08-06 NOTE — Addendum Note (Signed)
 Addended by: Vincenza Hews on: 08/06/2023 08:33 AM   Modules accepted: Orders

## 2023-08-07 ENCOUNTER — Encounter: Payer: Self-pay | Admitting: Family Medicine

## 2023-08-07 LAB — CK: Total CK: 186 U/L — ABNORMAL HIGH (ref 32–182)

## 2023-08-22 ENCOUNTER — Ambulatory Visit (INDEPENDENT_AMBULATORY_CARE_PROVIDER_SITE_OTHER): Payer: Medicare Other

## 2023-08-22 DIAGNOSIS — I639 Cerebral infarction, unspecified: Secondary | ICD-10-CM | POA: Diagnosis not present

## 2023-08-29 LAB — CUP PACEART REMOTE DEVICE CHECK
Date Time Interrogation Session: 20250424091800
Implantable Pulse Generator Implant Date: 20241209
Pulse Gen Serial Number: 126424

## 2023-09-09 NOTE — Progress Notes (Signed)
 Bsx Loop Recorder

## 2023-09-20 ENCOUNTER — Ambulatory Visit (INDEPENDENT_AMBULATORY_CARE_PROVIDER_SITE_OTHER): Admitting: Family Medicine

## 2023-09-20 ENCOUNTER — Encounter: Payer: Self-pay | Admitting: Family Medicine

## 2023-09-20 VITALS — BP 132/70 | HR 70 | Temp 98.3°F | Ht 62.0 in | Wt 182.4 lb

## 2023-09-20 DIAGNOSIS — M7918 Myalgia, other site: Secondary | ICD-10-CM | POA: Diagnosis not present

## 2023-09-20 DIAGNOSIS — M19041 Primary osteoarthritis, right hand: Secondary | ICD-10-CM

## 2023-09-20 DIAGNOSIS — E7800 Pure hypercholesterolemia, unspecified: Secondary | ICD-10-CM

## 2023-09-20 DIAGNOSIS — Z87440 Personal history of urinary (tract) infections: Secondary | ICD-10-CM | POA: Diagnosis not present

## 2023-09-20 DIAGNOSIS — N390 Urinary tract infection, site not specified: Secondary | ICD-10-CM

## 2023-09-20 DIAGNOSIS — M19042 Primary osteoarthritis, left hand: Secondary | ICD-10-CM

## 2023-09-20 NOTE — Progress Notes (Signed)
 Subjective:    Patient ID: Amy Mills, female    DOB: 1950/05/10, 73 y.o.   MRN: [id number]  HPI  Wt Readings from Last 3 Encounters:  09/20/23 182 lb 6 oz (82.7 kg)  07/08/23 185 lb 3.2 oz (84 kg)  05/28/23 187 lb 12.8 oz (85.2 kg)   33.36 kg/m  Vitals:   09/20/23 1525  BP: 132/70  Pulse: 70  Temp: 98.3 F (36.8 C)  SpO2: 99%   Pt presents with c/o  All over body pain  Worse since stopping the diclofenac    Pain is wide spread  Some finger joints get swollen  Thought she had gout in a finger (? Gout)   More joint pain than muscle /not much in muscles   There are times where she is more sensitve -even skin    More trouble with right hip Left knee  Right shoulder comes and goes   In pain all the time Cannot exercise due to pain and getting weaker   Ortho is Dr Lucienne Ryder     Dx with likely diffuse myofascial pain syndrome Rheum labs neg  Did not improve when she held crestor    Taking eliquis  for a fib so cannot have nsaids    Did have mildly elevated cpk -then improved  Lab Results  Component Value Date   CKTOTAL 186 (H) 08/06/2023    Does not feel currently depressed   Some grief  Lost mother 2 weeks ago -was 33  Lost brother in law 6 weeks ago - sudden cardiac death  Thinks she is doing ok overall   A fib with chronic anticoag History of CVA  Lab Results  Component Value Date   CHOL 128 02/25/2023   HDL 53 02/25/2023   LDLCALC 54 02/25/2023   LDLDIRECT 212.8 07/02/2006   TRIG 121 02/25/2023   CHOLHDL 2.4 02/25/2023   Crestor  40 mg is controlling LDL well      Patient Active Problem List   Diagnosis Date Noted   Osteoarthritis of fingers of both hands 09/20/2023   New onset atrial fibrillation (HCC) 05/28/2023   Hypercoagulable state due to paroxysmal atrial fibrillation (HCC) 05/28/2023   Elevated CK 03/05/2023   Clavicle pain 01/28/2023   Joint pain 01/28/2023   Carotid atherosclerosis 10/22/2022   Chronic cerebrovascular  accident (CVA) 09/14/2022   Headache 09/05/2022   Onychomycosis 04/13/2022   History of breast cancer 09/12/2021   Colon cancer screening 12/12/2020   Medicare annual wellness visit, subsequent 08/28/2017   Estrogen deficiency 08/28/2017   Prediabetes 06/13/2015   Obesity 06/13/2015   Routine general medical examination at a health care facility 09/14/2011   STRESS REACTION, ACUTE, WITH EMOTIONAL DISTURBANCE 05/12/2008   HYPERCHOLESTEROLEMIA 04/21/2007   Essential hypertension 04/21/2007   History of recurrent UTIs 04/21/2007   Diffuse myofascial pain syndrome 04/21/2007   RETRACTION/LAG, EYELID 09/20/2006   PARESTHESIA 09/20/2006   MRI, BRAIN, ABNORMAL 09/20/2006   Past Medical History:  Diagnosis Date   Anxiety reaction    Back pain    Breast cancer (HCC)    CVA (cerebrovascular accident) (HCC)    Fibromyalgia    History of recurrent UTIs    HLD (hyperlipidemia)    Hypertension    Past Surgical History:  Procedure Laterality Date   ABDOMINAL HYSTERECTOMY     BREAST LUMPECTOMY     OVARIAN CYST REMOVAL     TEE WITHOUT CARDIOVERSION N/A 01/18/2023   Procedure: TRANSESOPHAGEAL ECHOCARDIOGRAM;  Surgeon: Luana Rumple, MD;  Location: [facility]  INVASIVE CV LAB;  Service: Cardiovascular;  Laterality: N/A;   TONSILLECTOMY     Social History   Tobacco Use   Smoking status: Never   Smokeless tobacco: Never   Tobacco comments:    Never smoked 07/08/23  Vaping Use   Vaping status: Never Used  Substance Use Topics   Alcohol use: Yes    Alcohol/week: 0.0 standard drinks of alcohol    Comment: rare   Drug use: No   Family History  Problem Relation Age of Onset   Heart failure Mother    Thyroid  nodules Mother    Heart disease Mother    Coronary artery disease Mother    Hypertension Father    Dementia Father    Alcohol abuse Brother    Hyperlipidemia Daughter    Hypertension Daughter    Cancer Maternal Aunt        breast   Breast cancer Maternal Aunt    Cancer Paternal  Aunt        breast   Breast cancer Paternal Aunt    Allergies  Allergen Reactions   Amoxicillin-Pot Clavulanate Diarrhea   Doxycycline  Nausea And Vomiting   Nitrofurantoin Hives   Current Outpatient Medications on File Prior to Visit  Medication Sig Dispense Refill   acetaminophen  (TYLENOL ) 325 MG tablet Take 650-975 mg by mouth as needed for moderate pain (pain score 4-6).     apixaban  (ELIQUIS ) 5 MG TABS tablet Take 1 tablet (5 mg total) by mouth 2 (two) times daily. 60 tablet 3   atenolol  (TENORMIN ) 25 MG tablet TAKE ONE TABLET BY MOUTH ONCE A DAY 90 tablet 2   cholecalciferol (VITAMIN D3) 25 MCG (1000 UNIT) tablet Take 1,000 Units by mouth daily.     Cyanocobalamin (B-12 PO) Take 1 capsule by mouth daily.     losartan  (COZAAR ) 50 MG tablet Take 1 tablet (50 mg total) by mouth daily. 90 tablet 3   Multiple Vitamins-Minerals (WOMENS MULTI PO) Take 1 tablet by mouth daily.       rosuvastatin  (CRESTOR ) 40 MG tablet Take 1 tablet (40 mg total) by mouth daily. TAKE 1 TABLET BY MOUTH ONCE A DAY 90 tablet 3   SUMAtriptan  (IMITREX ) 50 MG tablet TAKE 1 TABLET EVERY 2 HOURS AS NEEDED FOR MIGRAINE--MAX 2 DOSES PER DAY 10 tablet 11   trimethoprim  (TRIMPEX ) 100 MG tablet TAKE ONE TABLET BY MOUTH ONCE A DAY 90 tablet 2   No current facility-administered medications on file prior to visit.    Review of Systems  Constitutional:  Positive for fatigue. Negative for activity change, appetite change, fever and unexpected weight change.  HENT:  Negative for congestion, ear pain, rhinorrhea, sinus pressure and sore throat.   Eyes:  Negative for pain, redness and visual disturbance.  Respiratory:  Negative for cough, shortness of breath and wheezing.   Cardiovascular:  Negative for chest pain and palpitations.  Gastrointestinal:  Negative for abdominal pain, blood in stool, constipation and diarrhea.  Endocrine: Negative for polydipsia and polyuria.  Genitourinary:  Negative for dysuria, frequency and  urgency.  Musculoskeletal:  Positive for arthralgias, back pain and joint swelling. Negative for myalgias.       Joint swelling in distal phalanges  Skin:  Negative for pallor and rash.  Allergic/Immunologic: Negative for environmental allergies.  Neurological:  Negative for dizziness, syncope and headaches.  Hematological:  Negative for adenopathy. Does not bruise/bleed easily.  Psychiatric/Behavioral:  Negative for decreased concentration and dysphoric mood. The patient is not nervous/anxious.  Objective:   Physical Exam Constitutional:      General: She is not in acute distress.    Appearance: Normal appearance. She is well-developed. She is obese. She is not ill-appearing or diaphoretic.  HENT:     Head: Normocephalic and atraumatic.  Eyes:     Conjunctiva/sclera: Conjunctivae normal.     Pupils: Pupils are equal, round, and reactive to light.  Neck:     Thyroid : No thyromegaly.     Vascular: No carotid bruit or JVD.  Cardiovascular:     Rate and Rhythm: Normal rate and regular rhythm.     Heart sounds: Normal heart sounds.     No gallop.  Pulmonary:     Effort: Pulmonary effort is normal. No respiratory distress.     Breath sounds: Normal breath sounds. No wheezing or rales.  Abdominal:     General: There is no distension or abdominal bruit.     Palpations: Abdomen is soft.  Musculoskeletal:     Cervical back: Normal range of motion and neck supple.     Right lower leg: No edema.     Left lower leg: No edema.     Comments: Changes of OA in distal fingers  Deformity in left 5th distal pip  Some pain making fist   No acute swelling of other joints Some greater trochanter tenderness   Lymphadenopathy:     Cervical: No cervical adenopathy.  Skin:    General: Skin is warm and dry.     Coloration: Skin is not pale.     Findings: No rash.  Neurological:     Mental Status: She is alert.     Coordination: Coordination normal.     Deep Tendon Reflexes: Reflexes  are normal and symmetric. Reflexes normal.  Psychiatric:        Mood and Affect: Mood normal.           Assessment & Plan:   Problem List Items Addressed This Visit       Musculoskeletal and Integument   Osteoarthritis of fingers of both hands   More painful lately  Some herberden's nodes noted  Left 5th finger distal joint is deformed   Had to stop nsaid due to being on eliquis   Voltaren  gel may be helpful       Diffuse myofascial pain syndrome - Primary   Widespread body pain -more in joints than muscles   Discussed another trial off statin to see if this may be cause (did have elevated cpk in past) Will try one week and report back  If not improved-may try week off trimetoprim (also possible side effects)   If neither medication is causing pain - would consider gabapentin/ lyrica  Cymbalta  also option (no depression however)   31  Minutes were spent today both face to face and in the chart obtaining history, reviewing records and test results, performing exam , educating and discussing treatment options based on response to holding medications             Other   HYPERCHOLESTEROLEMIA   Unsure if crestor  may be cause of wide spread joint pian  Will hold one week and report back   If this is the case-consider co Q 10, intermittent dosing/ zetia or pcyk9      History of recurrent UTIs   On trimethoprim  daily for prohphylaxis (originally from urology)   ? If possible cause of widespread pain - see a/p for this

## 2023-09-20 NOTE — Patient Instructions (Addendum)
 I would love you to try a water exercise program     Try a week off crestor  -let us  know if you have big improvement in pain   If crestor  if not the problem  Then try a week off trimethoprim   Report back on that    Then if not improved we can try a medicine for myofacial pain syndrome  Gabapentin , lyrica and cymbalta  are options   If gout symptoms return try and come in when it is happening  Stay hydrated

## 2023-09-22 NOTE — Assessment & Plan Note (Addendum)
 Widespread body pain -more in joints than muscles   Discussed another trial off statin to see if this may be cause (did have elevated cpk in past) Will try one week and report back  If not improved-may try week off trimetoprim (also possible side effects)   If neither medication is causing pain - would consider gabapentin/ lyrica  Cymbalta  also option (no depression however)   31  Minutes were spent today both face to face and in the chart obtaining history, reviewing records and test results, performing exam , educating and discussing treatment options based on response to holding medications

## 2023-09-22 NOTE — Assessment & Plan Note (Signed)
 More painful lately  Some herberden's nodes noted  Left 5th finger distal joint is deformed   Had to stop nsaid due to being on eliquis   Voltaren  gel may be helpful

## 2023-09-22 NOTE — Assessment & Plan Note (Signed)
 On trimethoprim  daily for prohphylaxis (originally from urology)   ? If possible cause of widespread pain - see a/p for this

## 2023-09-22 NOTE — Assessment & Plan Note (Signed)
 Unsure if crestor  may be cause of wide spread joint pian  Will hold one week and report back   If this is the case-consider co Q 10, intermittent dosing/ zetia or pcyk9

## 2023-09-23 ENCOUNTER — Ambulatory Visit: Payer: Medicare Other

## 2023-09-23 DIAGNOSIS — I639 Cerebral infarction, unspecified: Secondary | ICD-10-CM | POA: Diagnosis not present

## 2023-09-24 LAB — CUP PACEART REMOTE DEVICE CHECK
Date Time Interrogation Session: 20250519002000
Implantable Pulse Generator Implant Date: 20241209
Pulse Gen Serial Number: 126424

## 2023-09-25 DIAGNOSIS — I639 Cerebral infarction, unspecified: Secondary | ICD-10-CM | POA: Diagnosis not present

## 2023-09-25 DIAGNOSIS — H2513 Age-related nuclear cataract, bilateral: Secondary | ICD-10-CM | POA: Diagnosis not present

## 2023-09-29 ENCOUNTER — Ambulatory Visit: Payer: Self-pay | Admitting: Cardiology

## 2023-10-02 NOTE — Progress Notes (Signed)
 Carelink Summary Report / Loop Recorder

## 2023-10-02 NOTE — Addendum Note (Signed)
 Addended by: Lott Rouleau A on: 10/02/2023 11:30 AM   Modules accepted: Orders

## 2023-10-07 ENCOUNTER — Other Ambulatory Visit: Payer: Self-pay | Admitting: Family Medicine

## 2023-10-11 ENCOUNTER — Other Ambulatory Visit: Payer: Medicare Other

## 2023-10-11 DIAGNOSIS — Z9889 Other specified postprocedural states: Secondary | ICD-10-CM | POA: Diagnosis not present

## 2023-10-11 DIAGNOSIS — R928 Other abnormal and inconclusive findings on diagnostic imaging of breast: Secondary | ICD-10-CM | POA: Diagnosis not present

## 2023-10-11 DIAGNOSIS — Z1231 Encounter for screening mammogram for malignant neoplasm of breast: Secondary | ICD-10-CM | POA: Diagnosis not present

## 2023-10-11 DIAGNOSIS — C50311 Malignant neoplasm of lower-inner quadrant of right female breast: Secondary | ICD-10-CM | POA: Diagnosis not present

## 2023-10-11 DIAGNOSIS — Z08 Encounter for follow-up examination after completed treatment for malignant neoplasm: Secondary | ICD-10-CM | POA: Diagnosis not present

## 2023-10-11 DIAGNOSIS — Z923 Personal history of irradiation: Secondary | ICD-10-CM | POA: Diagnosis not present

## 2023-10-11 DIAGNOSIS — Z853 Personal history of malignant neoplasm of breast: Secondary | ICD-10-CM | POA: Diagnosis not present

## 2023-10-11 DIAGNOSIS — I4811 Longstanding persistent atrial fibrillation: Secondary | ICD-10-CM | POA: Diagnosis not present

## 2023-10-11 DIAGNOSIS — Z171 Estrogen receptor negative status [ER-]: Secondary | ICD-10-CM | POA: Diagnosis not present

## 2023-10-11 DIAGNOSIS — Z8673 Personal history of transient ischemic attack (TIA), and cerebral infarction without residual deficits: Secondary | ICD-10-CM | POA: Diagnosis not present

## 2023-10-12 ENCOUNTER — Encounter: Payer: Self-pay | Admitting: Family Medicine

## 2023-10-24 ENCOUNTER — Ambulatory Visit: Payer: Self-pay | Admitting: Cardiology

## 2023-10-24 ENCOUNTER — Ambulatory Visit (INDEPENDENT_AMBULATORY_CARE_PROVIDER_SITE_OTHER): Payer: Medicare Other

## 2023-10-24 DIAGNOSIS — I639 Cerebral infarction, unspecified: Secondary | ICD-10-CM | POA: Diagnosis not present

## 2023-10-24 LAB — CUP PACEART REMOTE DEVICE CHECK
Date Time Interrogation Session: 20250619002300
Implantable Pulse Generator Implant Date: 20241209
Pulse Gen Serial Number: 126424

## 2023-11-13 NOTE — Addendum Note (Signed)
 Addended by: TAWNI DRILLING D on: 11/13/2023 04:03 PM   Modules accepted: Orders

## 2023-11-13 NOTE — Progress Notes (Signed)
 CLARITY LOOP RECORDER

## 2023-11-25 ENCOUNTER — Ambulatory Visit: Payer: Medicare Other

## 2023-11-25 DIAGNOSIS — I639 Cerebral infarction, unspecified: Secondary | ICD-10-CM | POA: Diagnosis not present

## 2023-11-26 ENCOUNTER — Ambulatory Visit (INDEPENDENT_AMBULATORY_CARE_PROVIDER_SITE_OTHER): Payer: Medicare Other

## 2023-11-26 VITALS — Ht 62.0 in | Wt 177.0 lb

## 2023-11-26 DIAGNOSIS — Z1211 Encounter for screening for malignant neoplasm of colon: Secondary | ICD-10-CM

## 2023-11-26 DIAGNOSIS — Z0000 Encounter for general adult medical examination without abnormal findings: Secondary | ICD-10-CM | POA: Diagnosis not present

## 2023-11-26 LAB — CUP PACEART REMOTE DEVICE CHECK
Date Time Interrogation Session: 20250721002600
Implantable Pulse Generator Implant Date: 20241209
Pulse Gen Serial Number: 126424

## 2023-11-26 NOTE — Progress Notes (Addendum)
 Subjective:   Amy Mills is a 73 y.o. who presents for a Medicare Wellness preventive visit.  As a reminder, Annual Wellness Visits don't include a physical exam, and some assessments may be limited, especially if this visit is performed virtually. We may recommend an in-person follow-up visit with your provider if needed.  Visit Complete: Virtual I connected with  Amy Mills on 11/26/23 by a audio enabled telemedicine application and verified that I am speaking with the correct person using two identifiers.  Patient Location: Home  Provider Location: Office/Clinic  I discussed the limitations of evaluation and management by telemedicine. The patient expressed understanding and agreed to proceed.  Vital Signs: Because this visit was a virtual/telehealth visit, some criteria may be missing or patient reported. Any vitals not documented were not able to be obtained and vitals that have been documented are patient reported.  VideoDeclined- This patient declined Librarian, academic. Therefore the visit was completed with audio only.  Persons Participating in Visit: Patient.  AWV Questionnaire: No: Patient Medicare AWV questionnaire was not completed prior to this visit.  Cardiac Risk Factors include: advanced age (>110men, >32 women);dyslipidemia;hypertension;obesity (BMI >30kg/m2);sedentary lifestyle     Objective:    Today's Vitals   11/26/23 1304  Weight: 177 lb (80.3 kg)  Height: 5' 2 (1.575 m)   Body mass index is 32.37 kg/m.     11/26/2023    1:12 PM 11/21/2022    2:08 PM 12/15/2021    8:46 AM 01/28/2021   11:34 PM  Advanced Directives  Does Patient Have a Medical Advance Directive? No No No No  Would patient like information on creating a medical advance directive?  No - Patient declined  No - Patient declined    Current Medications (verified) Outpatient Encounter Medications as of 11/26/2023  Medication Sig   acetaminophen  (TYLENOL )  325 MG tablet Take 650-975 mg by mouth as needed for moderate pain (pain score 4-6).   apixaban  (ELIQUIS ) 5 MG TABS tablet Take 1 tablet (5 mg total) by mouth 2 (two) times daily.   atenolol  (TENORMIN ) 25 MG tablet TAKE ONE TABLET BY MOUTH ONCE A DAY   cholecalciferol (VITAMIN D3) 25 MCG (1000 UNIT) tablet Take 1,000 Units by mouth daily.   Cyanocobalamin (B-12 PO) Take 1 capsule by mouth daily.   losartan  (COZAAR ) 50 MG tablet TAKE ONE TABLET (50 MG TOTAL) BY MOUTH DAILY.   Multiple Vitamins-Minerals (WOMENS MULTI PO) Take 1 tablet by mouth daily.     rosuvastatin  (CRESTOR ) 40 MG tablet Take 1 tablet (40 mg total) by mouth daily. TAKE 1 TABLET BY MOUTH ONCE A DAY   SUMAtriptan  (IMITREX ) 50 MG tablet TAKE 1 TABLET EVERY 2 HOURS AS NEEDED FOR MIGRAINE--MAX 2 DOSES PER DAY   trimethoprim  (TRIMPEX ) 100 MG tablet TAKE ONE TABLET BY MOUTH ONCE A DAY   No facility-administered encounter medications on file as of 11/26/2023.    Allergies (verified) Amoxicillin-pot clavulanate, Doxycycline , and Nitrofurantoin   History: Past Medical History:  Diagnosis Date   Anxiety reaction    Back pain    Breast cancer (HCC)    CVA (cerebrovascular accident) (HCC)    Fibromyalgia    History of recurrent UTIs    HLD (hyperlipidemia)    Hypertension    Past Surgical History:  Procedure Laterality Date   ABDOMINAL HYSTERECTOMY     APPENDECTOMY  1968   BREAST LUMPECTOMY     OVARIAN CYST REMOVAL     TEE WITHOUT  CARDIOVERSION N/A 01/18/2023   Procedure: TRANSESOPHAGEAL ECHOCARDIOGRAM;  Surgeon: Francyne Headland, MD;  Location: [facility] INVASIVE CV LAB;  Service: Cardiovascular;  Laterality: N/A;   TONSILLECTOMY     Family History  Problem Relation Age of Onset   Heart failure Mother    Thyroid  nodules Mother    Heart disease Mother    Coronary artery disease Mother    Hypertension Father    Dementia Father    Alcohol abuse Brother    Hyperlipidemia Daughter    Hypertension Daughter    Cancer Maternal  Aunt        breast   Breast cancer Maternal Aunt    Cancer Paternal Aunt        breast   Breast cancer Paternal Aunt    Alcohol abuse Son    Social History   Socioeconomic History   Marital status: Married    Spouse name: Not on file   Number of children: 4   Years of education: Not on file   Highest education level: Associate degree: academic program  Occupational History   Not on file  Tobacco Use   Smoking status: Never   Smokeless tobacco: Never   Tobacco comments:    Never smoked 07/08/23  Vaping Use   Vaping status: Never Used  Substance and Sexual Activity   Alcohol use: Yes    Alcohol/week: 0.0 standard drinks of alcohol    Comment: rare   Drug use: No   Sexual activity: Not on file  Other Topics Concern   Not on file  Social History Narrative   Not on file   Social Drivers of Health   Financial Resource Strain: Low Risk  (11/23/2023)   Overall Financial Resource Strain (CARDIA)    Difficulty of Paying Living Expenses: Not hard at all  Food Insecurity: No Food Insecurity (11/23/2023)   Hunger Vital Sign    Worried About Running Out of Food in the Last Year: Never true    Ran Out of Food in the Last Year: Never true  Transportation Needs: No Transportation Needs (11/23/2023)   PRAPARE - Administrator, Civil Service (Medical): No    Lack of Transportation (Non-Medical): No  Physical Activity: Insufficiently Active (11/23/2023)   Exercise Vital Sign    Days of Exercise per Week: 2 days    Minutes of Exercise per Session: 30 min  Stress: No Stress Concern Present (11/23/2023)   [employer] of Occupational Health - Occupational Stress Questionnaire    Feeling of Stress: Only a little  Social Connections: Socially Integrated (11/23/2023)   Social Connection and Isolation Panel    Frequency of Communication with Friends and Family: More than three times a week    Frequency of Social Gatherings with Friends and Family: More than three times a  week    Attends Religious Services: More than 4 times per year    Active Member of [employer] or Organizations: Yes    Attends Engineer, structural: More than 4 times per year    Marital Status: Married    Tobacco Counseling Counseling given: Not Answered Tobacco comments: Never smoked 07/08/23   Clinical Intake:  Pre-visit preparation completed: Yes  Pain : 0-10 Pain Type: Chronic pain Pain Location: Generalized Pain Descriptors / Indicators: Aching Pain Onset: More than a month ago Pain Frequency: Constant Pain Relieving Factors: Tylenol  Effect of Pain on Daily Activities: prohibits exercise  Pain Relieving Factors: Tylenol   BMI - recorded: 32.37 Nutritional Status: BMI >  30  Obese Nutritional Risks: None Diabetes: No  Lab Results  Component Value Date   HGBA1C 6.5 02/12/2022   HGBA1C 6.0 12/05/2020   HGBA1C 6.0 12/04/2019     How often do you need to have someone help you when you read instructions, pamphlets, or other written materials from your doctor or pharmacy?: 1 - Never  Interpreter Needed?: No  Comments: lives with husband and other family members Information entered by :: B.Zackari Ruane,LPN   Activities of Daily Living     11/23/2023    9:03 AM 01/18/2023    9:26 AM  In your present state of health, do you have any difficulty performing the following activities:  Hearing? 0 0  Vision? 0 0  Difficulty concentrating or making decisions? 0 0  Walking or climbing stairs? 0 0  Dressing or bathing? 0 0  Doing errands, shopping? 0   Preparing Food and eating ? N   Using the Toilet? N   In the past six months, have you accidently leaked urine? Y   Do you have problems with loss of bowel control? N   Managing your Medications? N   Managing your Finances? N   Housekeeping or managing your Housekeeping? N     Patient Care Team: Tower, Laine LABOR, MD as PCP - General Kennyth Chew, MD as PCP - Electrophysiology (Cardiology) [facility]  I have updated your Care Teams any recent Medical Services you may have received from other providers in the past year.     Assessment:   This is a routine wellness examination for Amy Mills.  Hearing/Vision screen Hearing Screening - Comments:: Pt says her hearing is good Vision Screening - Comments:: Pt says her vision is good Dr JAYSON Gaudy   Goals Addressed             This Visit's Progress    COMPLETED: Patient Stated   On track    12/15/2021, get through the chemo     Patient Stated       11/26/23-Lose 30 pounds.       Depression Screen     11/26/2023    1:10 PM 01/28/2023    4:05 PM 11/21/2022    2:06 PM 09/28/2022    3:14 PM 08/07/2022    9:43 AM 04/13/2022    2:45 PM 12/15/2021    8:47 AM  PHQ 2/9 Scores  PHQ - 2 Score 0 0 0 0 0 0 0  PHQ- 9 Score  2 0 0 1      Fall Risk     11/23/2023    9:03 AM 09/20/2023    3:30 PM 01/28/2023    4:05 PM 11/21/2022    2:09 PM 09/28/2022    3:13 PM  Fall Risk   Falls in the past year? 0 0 0 1 0  Comment    Knocked down in yard by dog   Number falls in past yr: 0 0 0 0 0  Injury with Fall? 0 0 0 0 0  Comment    bruised knee   Risk for fall due to : No Fall Risks No Fall Risks No Fall Risks No Fall Risks No Fall Risks  Follow up Falls prevention discussed;Education provided Falls evaluation completed Falls evaluation completed Falls prevention discussed;Falls evaluation completed Falls evaluation completed    MEDICARE RISK AT HOME:  Medicare Risk at Home Any stairs in or around the home?: (Patient-Rptd) Yes If so, are there any without handrails?: (Patient-Rptd)  No Home free of loose throw rugs in walkways, pet beds, electrical cords, etc?: (Patient-Rptd) No Adequate lighting in your home to reduce risk of falls?: (Patient-Rptd) Yes Life alert?: (Patient-Rptd) No Use of a cane, walker or w/c?: (Patient-Rptd) No Grab bars in the bathroom?: (Patient-Rptd) No Shower chair or bench in shower?: (Patient-Rptd) Yes Elevated  toilet seat or a handicapped toilet?: (Patient-Rptd) Yes  TIMED UP AND GO:  Was the test performed?  No  Cognitive Function: 6CIT completed        11/26/2023    1:15 PM 11/21/2022    2:11 PM 12/15/2021    8:48 AM  6CIT Screen  What Year? 0 points 0 points 0 points  What month? 0 points 0 points 0 points  What time? 0 points 0 points 0 points  Count back from 20 0 points 0 points 0 points  Months in reverse 0 points 0 points 0 points  Repeat phrase 0 points 0 points 2 points  Total Score 0 points 0 points 2 points    Immunizations Immunization History  Administered Date(s) Administered   PFIZER(Purple Top)SARS-COV-2 Vaccination 06/01/2019, 06/22/2019   Pneumococcal Conjugate-13 08/28/2017   Td 05/07/1997, 05/07/2005    Screening Tests Health Maintenance  Topic Date Due   Colonoscopy  05/15/2020   MAMMOGRAM  10/03/2023   DTaP/Tdap/Td (3 - Tdap) 03/03/2024 (Originally 05/08/2015)   Pneumococcal Vaccine: 50+ Years (2 of 2 - PPSV23, PCV20, or PCV21) 03/03/2024 (Originally 10/23/2017)   Zoster Vaccines- Shingrix (1 of 2) 12/12/2025 (Originally 02[birth year]04/1970)   COVID-19 Vaccine (3 - Pfizer risk series) 03/19/2026 (Originally 07/20/2019)   INFLUENZA VACCINE  12/06/2023   Medicare Annual Wellness (AWV)  11/25/2024   DEXA SCAN  Completed   Hepatitis C Screening  Completed   Hepatitis B Vaccines  Aged Out   HPV VACCINES  Aged Out   Meningococcal B Vaccine  Aged Out    Health Maintenance  Health Maintenance Due  Topic Date Due   Colonoscopy  05/15/2020   MAMMOGRAM  10/03/2023   Health Maintenance Items Addressed: Referral sent to GI for colonoscopy [facility]  already done [facility] June [facility]  Additional Screening:  Vision Screening: Recommended annual ophthalmology exams for early detection of glaucoma and other disorders of the eye. Would you like a referral to an eye doctor? No    Dental Screening: Recommended annual dental exams for proper oral hygiene  Community Resource  Referral / Chronic Care Management: CRR required this visit?  No   CCM required this visit?  Appt scheduled with PCP   Plan:    I have personally reviewed and noted the following in the patient's chart:   Medical and social history Use of alcohol, tobacco or illicit drugs  Current medications and supplements including opioid prescriptions. Patient is not currently taking opioid prescriptions. Functional ability and status Nutritional status Physical activity Advanced directives List of other physicians Hospitalizations, surgeries, and ER visits in previous 12 months Vitals Screenings to include cognitive, depression, and falls Referrals and appointments  In addition, I have reviewed and discussed with patient certain preventive protocols, quality metrics, and best practice recommendations. A written personalized care plan for preventive services as well as general preventive health recommendations were provided to patient.   Erminio LITTIE Saris, LPN   [id number]   After Visit Summary: (MyChart) Due to this being a telephonic visit, the after visit summary with patients personalized plan was offered to patient via MyChart   Notes: Nothing significant to report at  this time.

## 2023-11-26 NOTE — Patient Instructions (Addendum)
 Ms. Lisbon , Thank you for taking time out of your busy schedule to complete your Annual Wellness Visit with me. I enjoyed our conversation and look forward to speaking with you again next year. I, as well as your care team,  appreciate your ongoing commitment to your health goals. Please review the following plan we discussed and let me know if I can assist you in the future. Your Game plan/ To Do List    Referrals: If you haven't heard from the office you've been referred to, please reach out to them at the phone provided.  Colorectal Cancer Screenings: referral placed to: [facility]  [address] [place], [address], [place], [place] [address]  1.3 mi [phone] Closes 5 PM  Follow up Visits: Next Medicare AWV with our clinical staff: 11/26/24 @ 1pm televisit   Have you seen your provider in the last 6 months (3 months if uncontrolled diabetes)? No Next Office Visit with your provider: 03/10/24 @ 3:30pm physical  Clinician Recommendations:  Aim for 30 minutes of exercise or brisk walking, 6-8 glasses of water, and 5 servings of fruits and vegetables each day.       This is a list of the screening recommended for you and due dates:  Health Maintenance  Topic Date Due   Colon Cancer Screening  05/15/2020   Mammogram  10/03/2023   DTaP/Tdap/Td vaccine (3 - Tdap) 03/03/2024*   Pneumococcal Vaccine for age over 75 (2 of 2 - PPSV23, PCV20, or PCV21) 03/03/2024*   Zoster (Shingles) Vaccine (1 of 2) 12/12/2025*   COVID-19 Vaccine (3 - Pfizer risk series) 03/19/2026*   Flu Shot  12/06/2023   Medicare Annual Wellness Visit  11/25/2024   DEXA scan (bone density measurement)  Completed   Hepatitis C Screening  Completed   Hepatitis B Vaccine  Aged Out   HPV Vaccine  Aged Out   Meningitis B Vaccine  Aged Out  *Topic was postponed. The date shown is not the original due date.    Advanced directives: (Declined) Advance directive discussed with you today. Even though you declined this  today, please call our office should you change your mind, and we can give you the proper paperwork for you to fill out. Advance Care Planning is important because it:  [x]  Makes sure you receive the medical care that is consistent with your values, goals, and preferences  [x]  It provides guidance to your family and loved ones and reduces their decisional burden about whether or not they are making the right decisions based on your wishes.  Follow the link provided in your after visit summary or read over the paperwork we have mailed to you to help you started getting your Advance Directives in place. If you need assistance in completing these, please reach out to us  so that we can help you!

## 2023-11-27 ENCOUNTER — Ambulatory Visit: Payer: Self-pay | Admitting: Cardiology

## 2023-12-25 NOTE — Progress Notes (Signed)
 Carelink Summary Report / Loop Recorder

## 2023-12-26 ENCOUNTER — Ambulatory Visit (INDEPENDENT_AMBULATORY_CARE_PROVIDER_SITE_OTHER): Payer: Medicare Other

## 2023-12-26 DIAGNOSIS — I639 Cerebral infarction, unspecified: Secondary | ICD-10-CM

## 2023-12-30 ENCOUNTER — Other Ambulatory Visit (HOSPITAL_COMMUNITY): Payer: Self-pay | Admitting: Internal Medicine

## 2023-12-30 LAB — CUP PACEART REMOTE DEVICE CHECK
Date Time Interrogation Session: 20250822130700
Implantable Pulse Generator Implant Date: 20241209
Pulse Gen Serial Number: 126424

## 2024-01-02 ENCOUNTER — Other Ambulatory Visit: Payer: Self-pay | Admitting: Family Medicine

## 2024-01-05 ENCOUNTER — Ambulatory Visit: Payer: Self-pay | Admitting: Cardiology

## 2024-01-08 ENCOUNTER — Other Ambulatory Visit: Payer: Self-pay | Admitting: Family Medicine

## 2024-01-27 ENCOUNTER — Ambulatory Visit (INDEPENDENT_AMBULATORY_CARE_PROVIDER_SITE_OTHER): Payer: Medicare Other

## 2024-01-27 DIAGNOSIS — I639 Cerebral infarction, unspecified: Secondary | ICD-10-CM | POA: Diagnosis not present

## 2024-01-28 LAB — CUP PACEART REMOTE DEVICE CHECK
Date Time Interrogation Session: 20250923001000
Implantable Pulse Generator Implant Date: 20241209
Pulse Gen Serial Number: 126424

## 2024-01-28 NOTE — Progress Notes (Signed)
 Remote Loop Recorder Transmission

## 2024-01-29 NOTE — Progress Notes (Signed)
 Remote Loop Recorder Transmission

## 2024-02-01 ENCOUNTER — Ambulatory Visit: Payer: Self-pay | Admitting: Cardiology

## 2024-02-03 IMAGING — US US  BREAST BX W/ LOC DEV 1ST LESION IMG BX SPEC US GUIDE*R*
1 series · 12 of 12 positions shown · non-contrast
Comparison: Prior studies
COMPARISON: Prior studies

Addendum:
CLINICAL DATA: Patient presents for ultrasound-guided core biopsy
of the RIGHT breast.

EXAM:
ULTRASOUND GUIDED RIGHT BREAST CORE NEEDLE BIOPSY

[Series 2: us breast bx w/ loc dev 1st lesion img bx spec us  · 0.06mm/px · 12 of 12 slices shown]
[im 1/12]
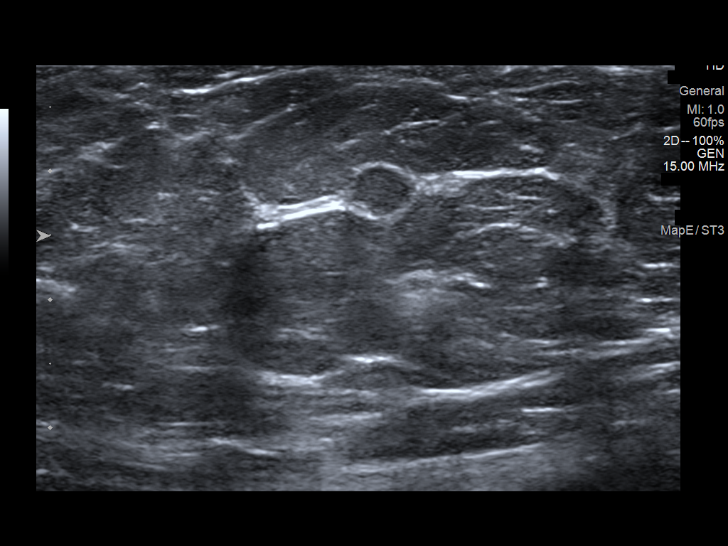
[im 2/12]
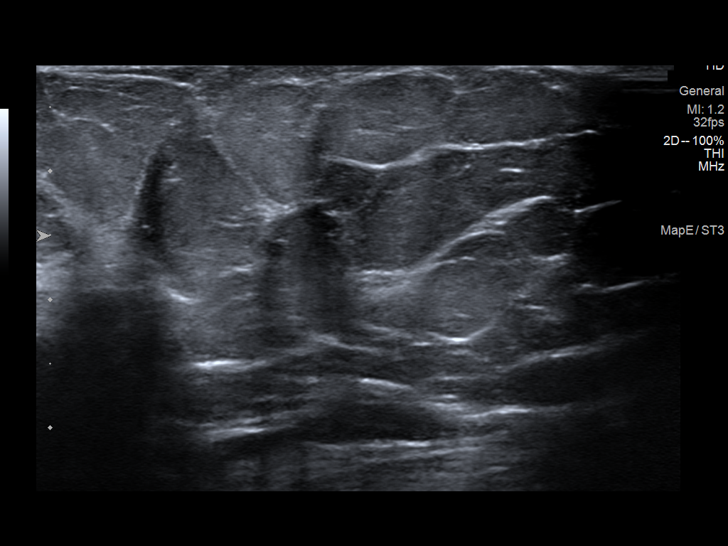
[im 3/12]
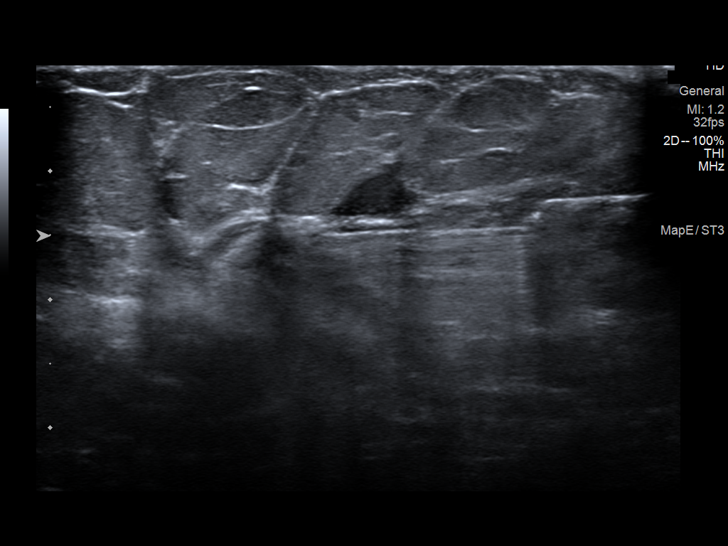
[im 4/12]
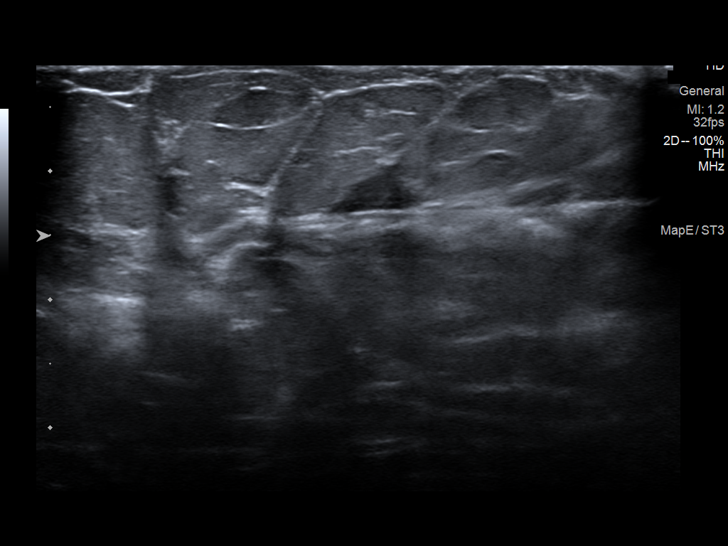
[im 5/12]
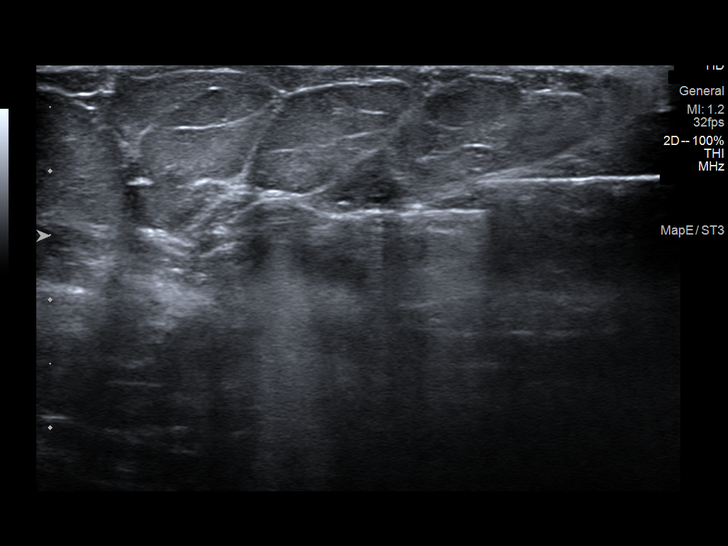
[im 6/12]
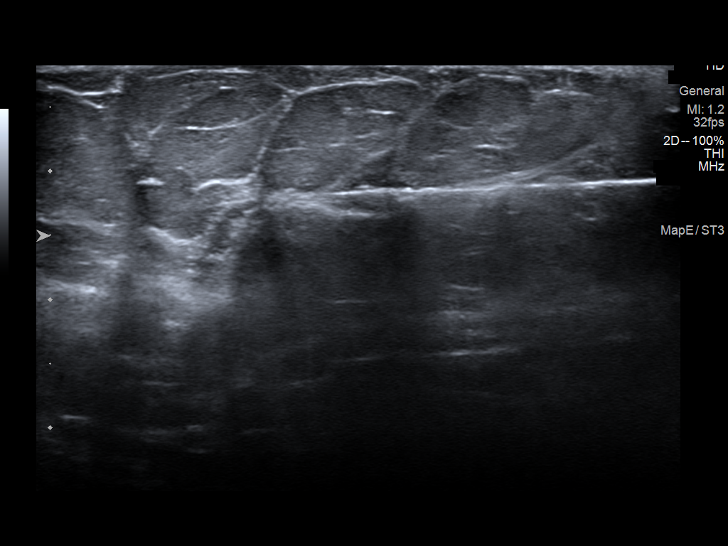
[im 7/12]
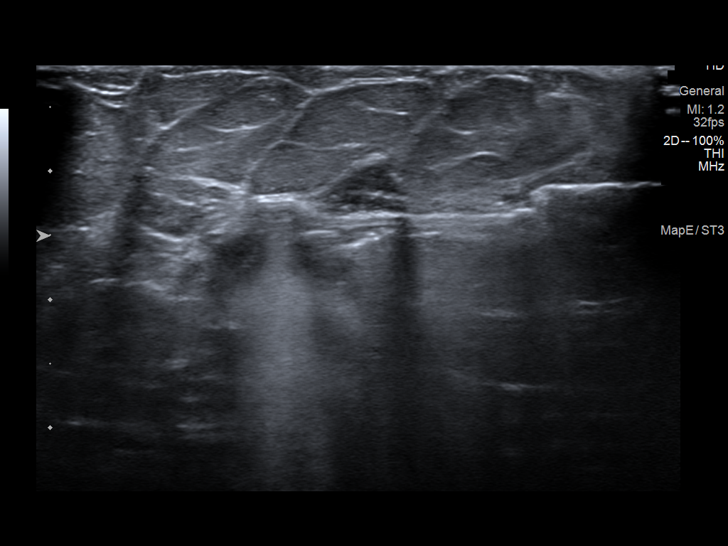
[im 8/12]
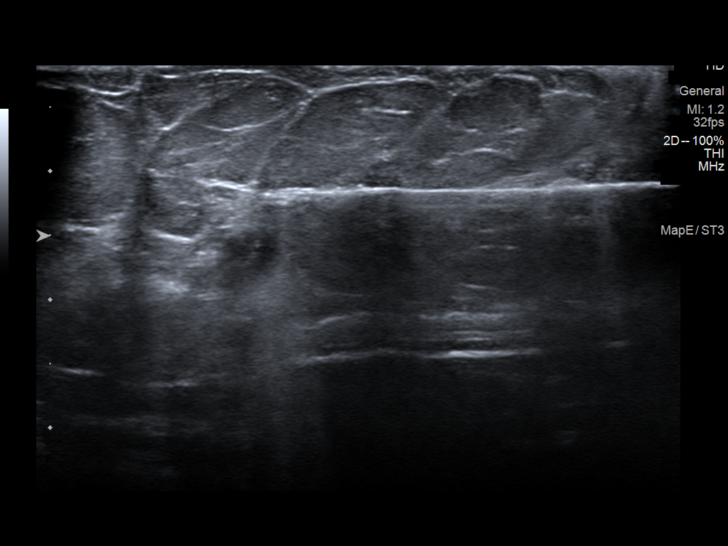
[im 9/12]
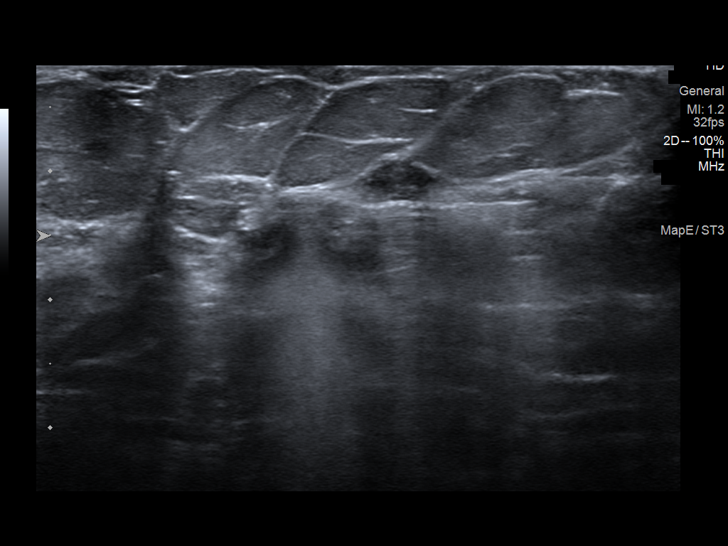
[im 10/12]
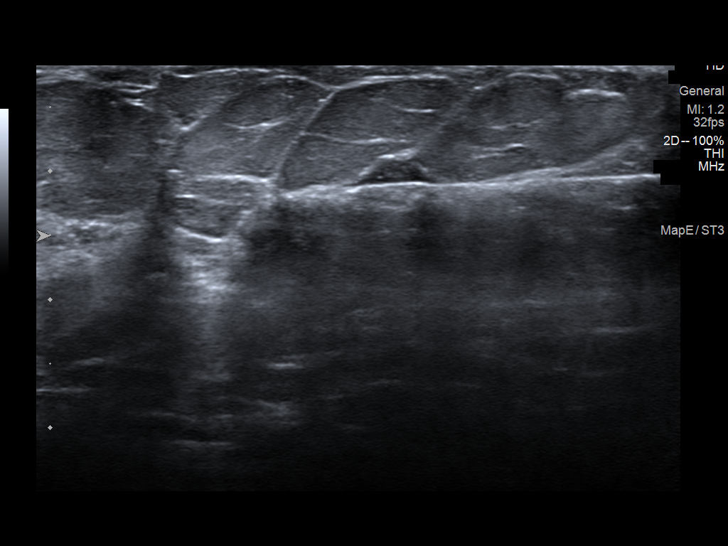
[im 11/12]
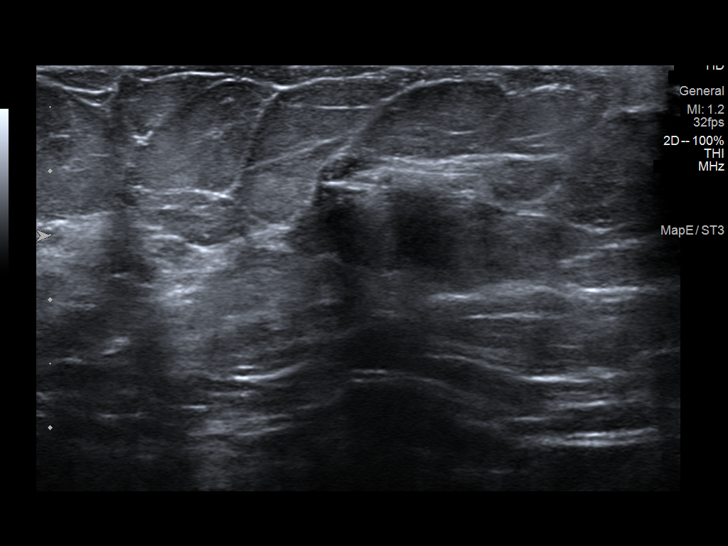
[im 12/12]
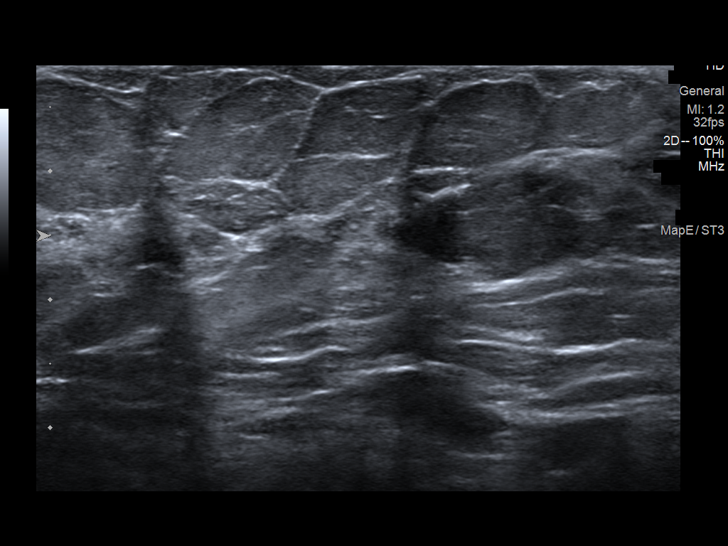

[12 of 12 positions shown; findings below may reference images not displayed]



Lesion quadrant: LOWER INNER QUADRANT RIGHT breast

Using sterile technique and 1% Lidocaine as local anesthetic, under
direct ultrasound visualization, a 12 gauge Danielsdottir device was
used to perform biopsy of mass in the 5 o'clock location of the
RIGHT breast 3 centimeters from the nipple using a inferior to
superior approach. At the conclusion of the procedure ribbon tissue
marker clip was deployed into the biopsy cavity. Follow up 2 view
mammogram was performed and dictated separately.
IMPRESSION: Ultrasound guided biopsy of RIGHT breast mass. No apparent
complications.

ADDENDUM:
Pathology revealed GRADE III INVASIVE MAMMARY (DUCTAL) CARCINOMA OF
NO SPECIAL TYPE-RIGHT breast, 5 o'clock, 9cmfn (ribbon clip). This
was found to be concordant by Dr. Haleemaage Robel Hadan.

The patient reported doing well after the biopsy with tenderness at
the site on 09/06/2021. Post biopsy instructions and care were
reviewed and questions were answered. The patient was encouraged to
call The [REDACTED] for any additional
concerns. Pathology results were obtained by patient from [REDACTED].
Nurse Navigator obtained results from [REDACTED] 09/11/2021 by
telephoning and requesting fax. Nurse Navigators made several
unsuccessful attempts by phone/text to reach patient to discuss
results.

Per [REDACTED] messaging, patient requested referral to [facility] By
Dr. Rad Hundal on 09/11/2021.

Pathology results reported by Saiming Lendio RN on 09/13/2021.



Lesion quadrant: LOWER INNER QUADRANT RIGHT breast

Using sterile technique and 1% Lidocaine as local anesthetic, under
direct ultrasound visualization, a 12 gauge Danielsdottir device was
used to perform biopsy of mass in the 5 o'clock location of the
RIGHT breast 3 centimeters from the nipple using a inferior to
superior approach. At the conclusion of the procedure ribbon tissue
marker clip was deployed into the biopsy cavity. Follow up 2 view
mammogram was performed and dictated separately.
IMPRESSION: Ultrasound guided biopsy of RIGHT breast mass. No apparent
complications.

## 2024-02-04 IMAGING — MG MM BREAST LOCALIZATION CLIP
4 series · 4 of 12 positions shown · non-contrast
Comparison: Prior studies

CLINICAL DATA: Status post ultrasound-guided core biopsy of mass in
the RIGHT breast.

EXAM:
3D DIAGNOSTIC RIGHT MAMMOGRAM POST ULTRASOUND BIOPSY

[R CC synth-2D]
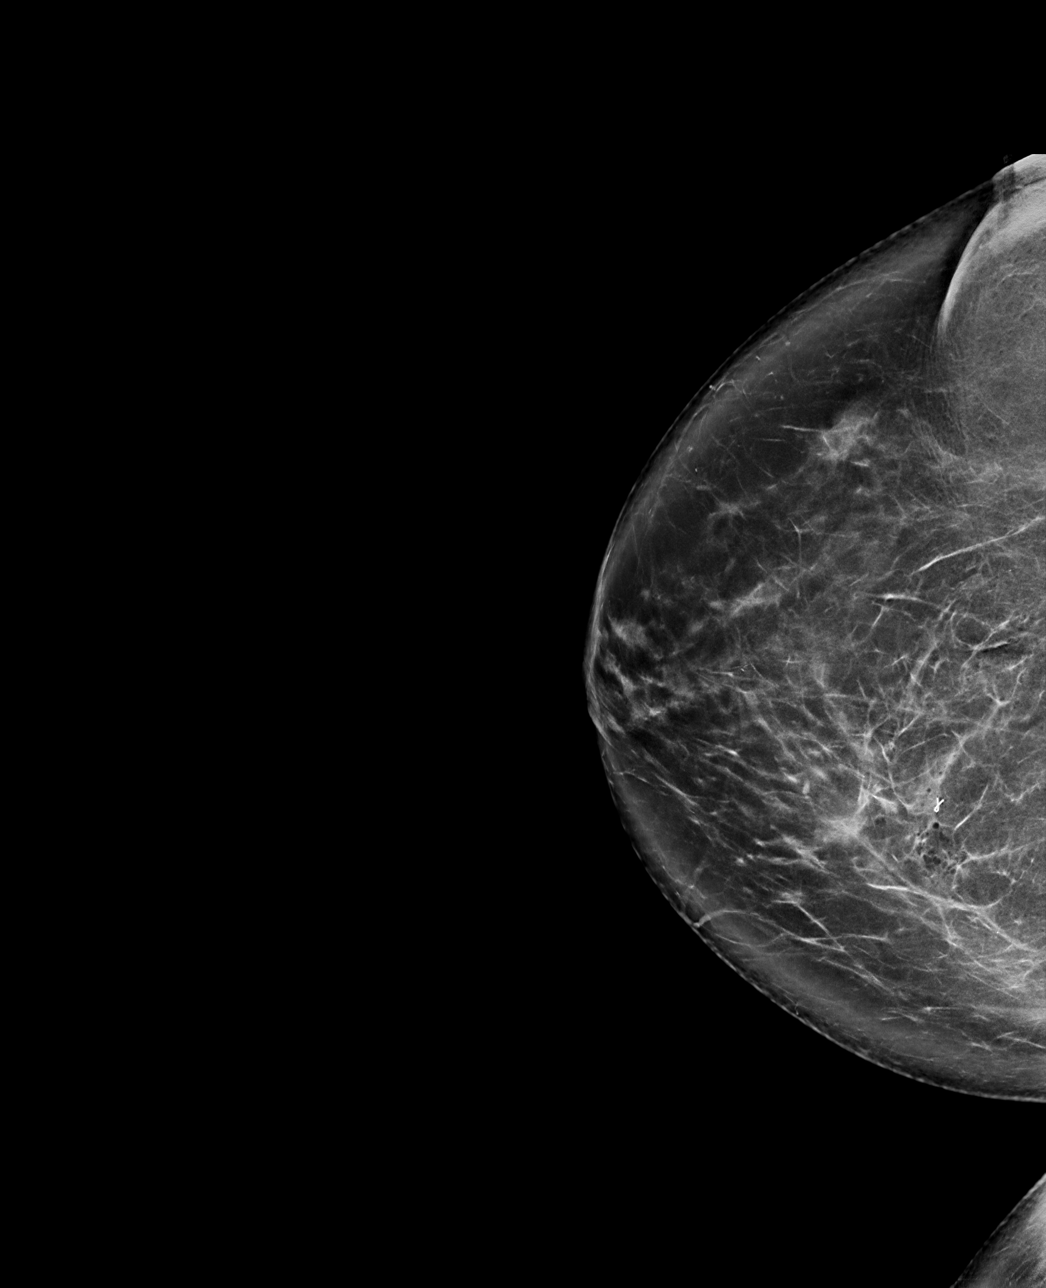

[R ML synth-2D]
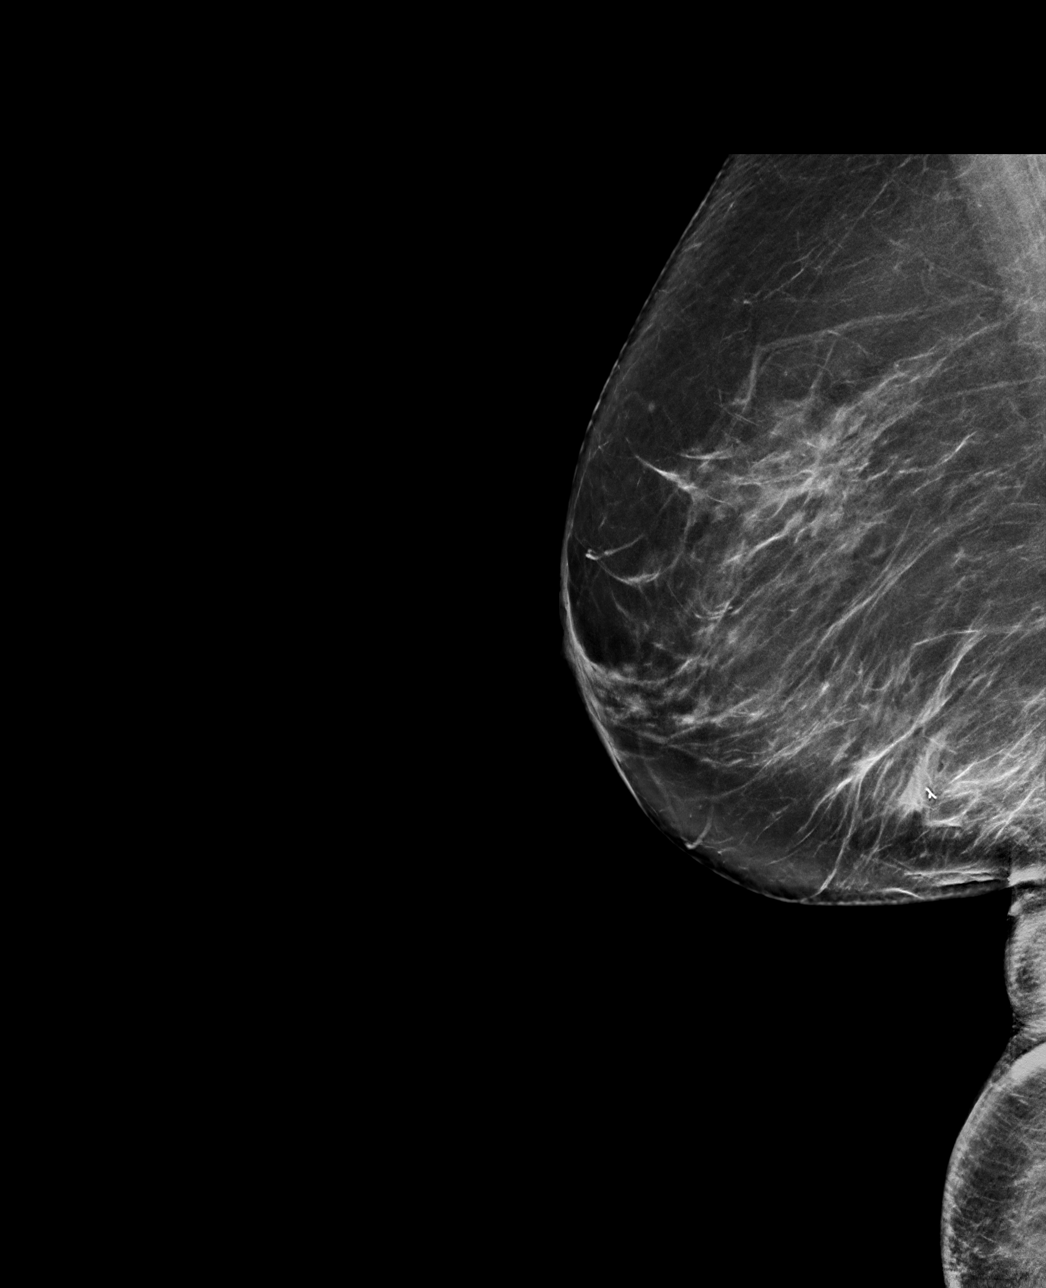

[R CC tomo · tomo slice 44/87.0]
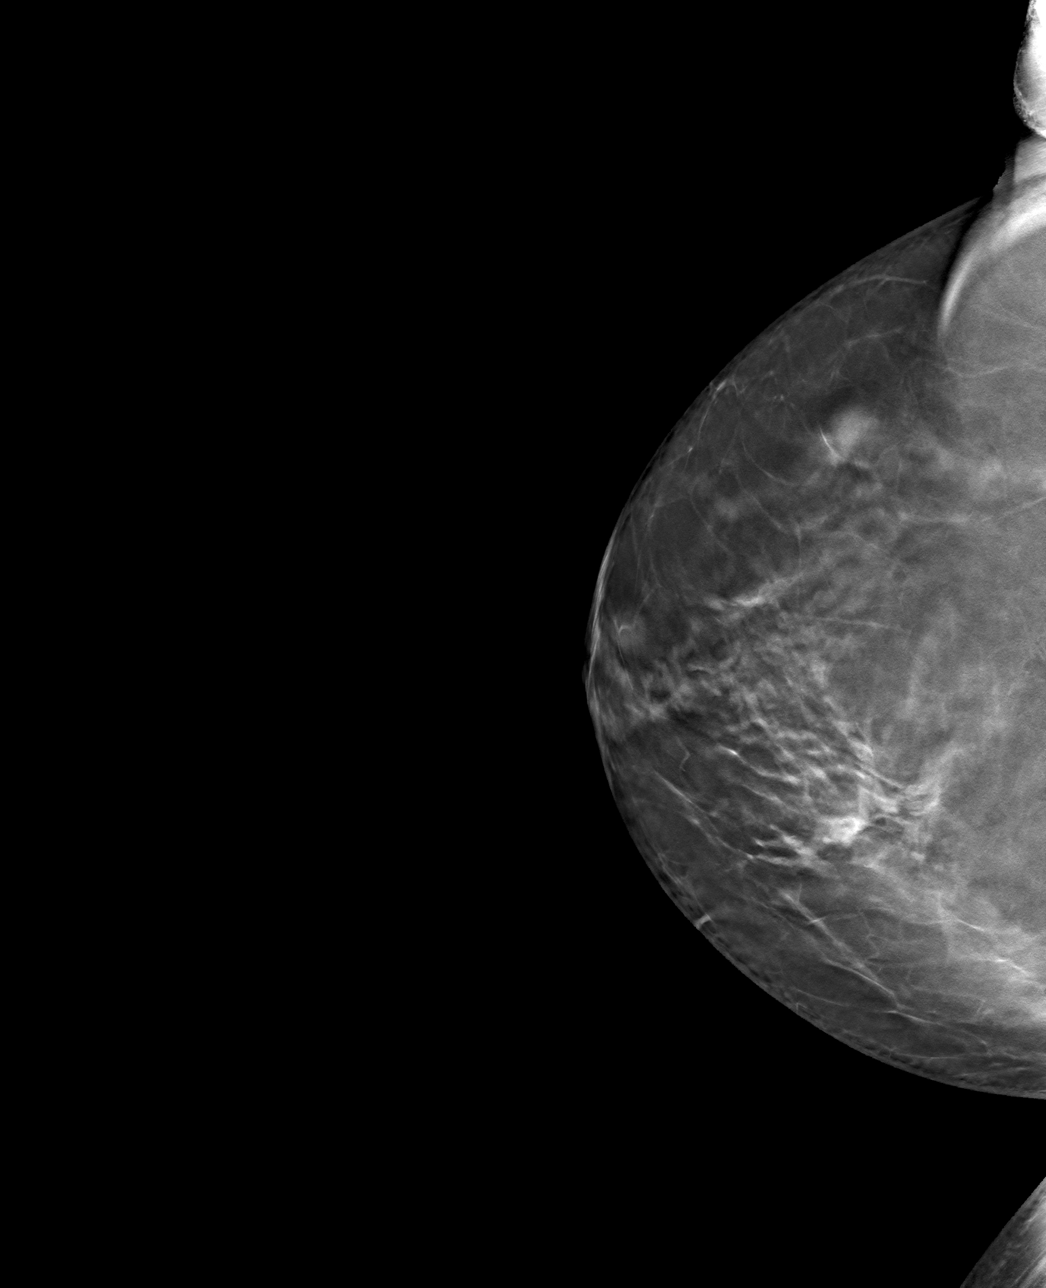

[R ML tomo · tomo slice 43/86.0]
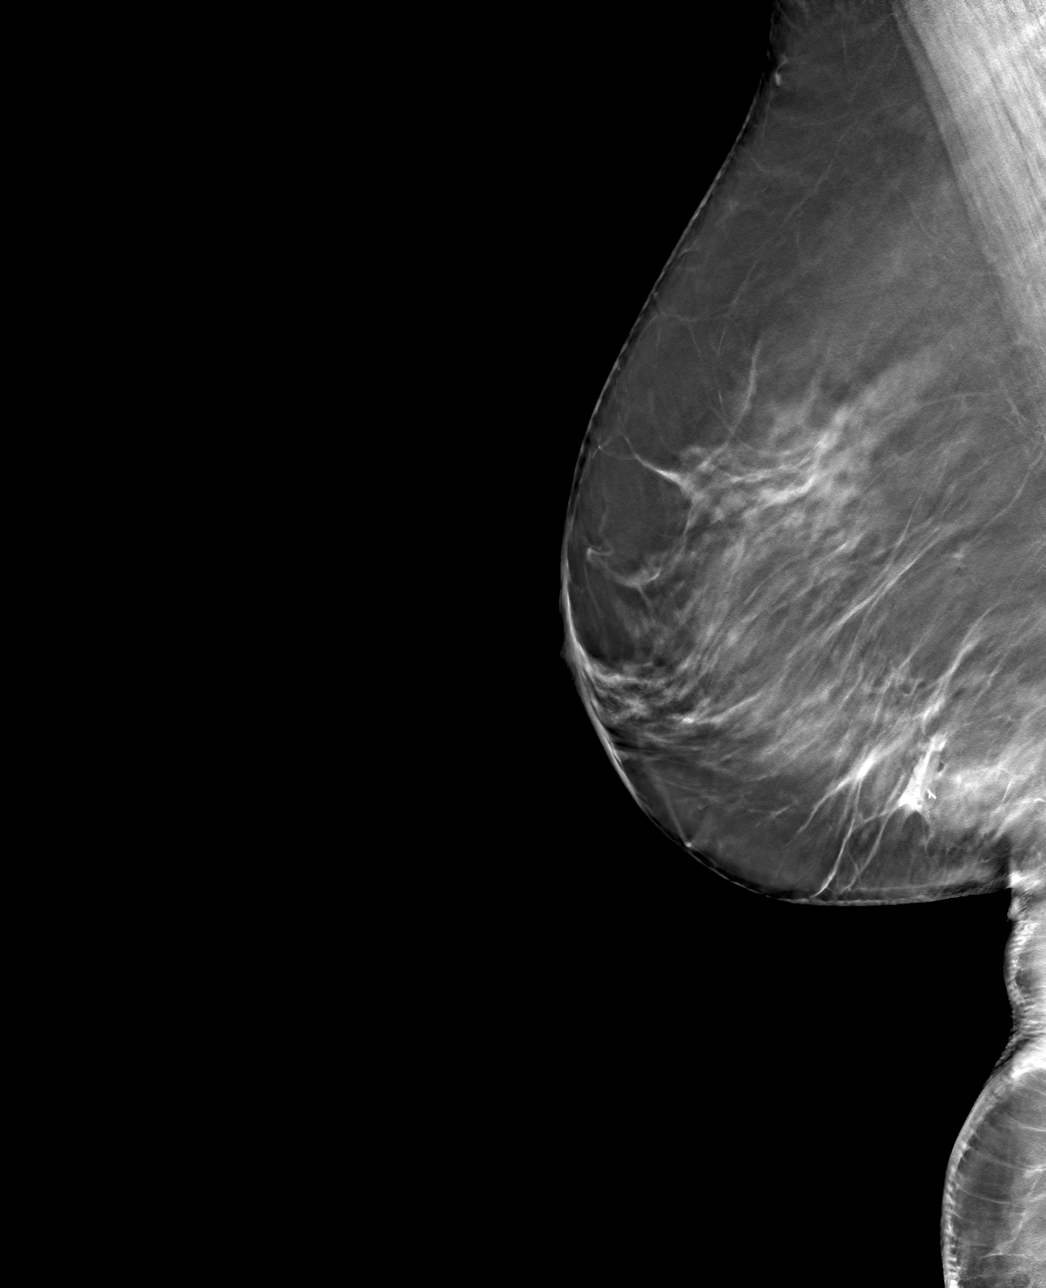

[4 of 12 positions shown; findings below may reference images not displayed]

FINDINGS: 3D Mammographic images were obtained following ultrasound guided
biopsy of mass in the 5 o'clock location of the RIGHT breast and
placement of a ribbon shaped clip. The biopsy marking clip is in
expected position at the site of biopsy. When compared with previous
screening and diagnostic imaging, the ultrasound and mammographic
findings correlate well.
IMPRESSION: Appropriate positioning of the ribbon shaped biopsy marking clip at
the site of biopsy in the LOWER INNER QUADRANT of the RIGHT breast.

Final Assessment: Post Procedure Mammograms for Marker Placement

## 2024-02-05 ENCOUNTER — Other Ambulatory Visit: Payer: Self-pay | Admitting: Cardiology

## 2024-02-06 NOTE — Telephone Encounter (Signed)
 Due for labs Has appointment in a month -aware

## 2024-02-10 ENCOUNTER — Other Ambulatory Visit: Payer: Self-pay | Admitting: Cardiology

## 2024-02-10 ENCOUNTER — Other Ambulatory Visit: Payer: Self-pay | Admitting: Family Medicine

## 2024-02-10 NOTE — Progress Notes (Signed)
 Remote Loop Recorder Transmission

## 2024-02-11 NOTE — Telephone Encounter (Signed)
 CPE 03/10/24  Last filled on 02/19/22 #10 tab/ 11 refills

## 2024-02-27 ENCOUNTER — Ambulatory Visit

## 2024-02-27 DIAGNOSIS — I639 Cerebral infarction, unspecified: Secondary | ICD-10-CM | POA: Diagnosis not present

## 2024-02-27 LAB — CUP PACEART REMOTE DEVICE CHECK
Date Time Interrogation Session: 20251023001300
Implantable Pulse Generator Implant Date: 20241209
Pulse Gen Serial Number: 126424

## 2024-02-28 NOTE — Progress Notes (Signed)
 Remote Loop Recorder Transmission

## 2024-02-29 ENCOUNTER — Ambulatory Visit: Payer: Self-pay | Admitting: Cardiology

## 2024-03-01 ENCOUNTER — Telehealth: Payer: Self-pay | Admitting: Family Medicine

## 2024-03-01 DIAGNOSIS — E7800 Pure hypercholesterolemia, unspecified: Secondary | ICD-10-CM

## 2024-03-01 DIAGNOSIS — I10 Essential (primary) hypertension: Secondary | ICD-10-CM

## 2024-03-01 DIAGNOSIS — R7303 Prediabetes: Secondary | ICD-10-CM

## 2024-03-01 NOTE — Telephone Encounter (Signed)
-----   Message from Veva JINNY Ferrari sent at 02/18/2024  3:31 PM EDT ----- Regarding: Lab orders forTue, 10.28.25 Patient is scheduled for CPX labs, please order future labs, Thanks , Veva

## 2024-03-03 ENCOUNTER — Other Ambulatory Visit (INDEPENDENT_AMBULATORY_CARE_PROVIDER_SITE_OTHER)

## 2024-03-03 DIAGNOSIS — E7800 Pure hypercholesterolemia, unspecified: Secondary | ICD-10-CM

## 2024-03-03 DIAGNOSIS — R7303 Prediabetes: Secondary | ICD-10-CM | POA: Diagnosis not present

## 2024-03-03 DIAGNOSIS — I10 Essential (primary) hypertension: Secondary | ICD-10-CM | POA: Diagnosis not present

## 2024-03-03 NOTE — Addendum Note (Signed)
 Addended by: HOPE VEVA PARAS on: 03/03/2024 07:29 AM   Modules accepted: Orders

## 2024-03-04 ENCOUNTER — Ambulatory Visit: Payer: Self-pay | Admitting: Family Medicine

## 2024-03-04 LAB — CBC WITH DIFFERENTIAL/PLATELET
Basophils Absolute: 0 x10E3/uL (ref 0.0–0.2)
Basos: 1 %
EOS (ABSOLUTE): 0.3 x10E3/uL (ref 0.0–0.4)
Eos: 5 %
Hematocrit: 42.9 % (ref 34.0–46.6)
Hemoglobin: 13.8 g/dL (ref 11.1–15.9)
Immature Grans (Abs): 0 x10E3/uL (ref 0.0–0.1)
Immature Granulocytes: 0 %
Lymphocytes Absolute: 1.1 x10E3/uL (ref 0.7–3.1)
Lymphs: 19 %
MCH: 30.2 pg (ref 26.6–33.0)
MCHC: 32.2 g/dL (ref 31.5–35.7)
MCV: 94 fL (ref 79–97)
Monocytes Absolute: 0.4 x10E3/uL (ref 0.1–0.9)
Monocytes: 7 %
Neutrophils Absolute: 4 x10E3/uL (ref 1.4–7.0)
Neutrophils: 68 %
Platelets: 253 x10E3/uL (ref 150–450)
RBC: 4.57 x10E6/uL (ref 3.77–5.28)
RDW: 12.2 % (ref 11.7–15.4)
WBC: 5.8 x10E3/uL (ref 3.4–10.8)

## 2024-03-04 LAB — COMPREHENSIVE METABOLIC PANEL WITH GFR
ALT: 22 IU/L (ref 0–32)
AST: 21 IU/L (ref 0–40)
Albumin: 4.6 g/dL (ref 3.8–4.8)
Alkaline Phosphatase: 84 IU/L (ref 49–135)
BUN/Creatinine Ratio: 17 (ref 12–28)
BUN: 14 mg/dL (ref 8–27)
Bilirubin Total: 0.4 mg/dL (ref 0.0–1.2)
CO2: 22 mmol/L (ref 20–29)
Calcium: 9.3 mg/dL (ref 8.7–10.3)
Chloride: 102 mmol/L (ref 96–106)
Creatinine, Ser: 0.83 mg/dL (ref 0.57–1.00)
Globulin, Total: 1.9 g/dL (ref 1.5–4.5)
Glucose: 126 mg/dL — ABNORMAL HIGH (ref 70–99)
Potassium: 4.6 mmol/L (ref 3.5–5.2)
Sodium: 141 mmol/L (ref 134–144)
Total Protein: 6.5 g/dL (ref 6.0–8.5)
eGFR: 74 mL/min/1.73 (ref >59)

## 2024-03-04 LAB — HEMOGLOBIN A1C
Est. average glucose Bld gHb Est-mCnc: 131 mg/dL
Hgb A1c MFr Bld: 6.2 % — ABNORMAL HIGH (ref 4.8–5.6)

## 2024-03-04 LAB — LIPID PANEL
Chol/HDL Ratio: 2.8 ratio (ref 0.0–4.4)
Cholesterol, Total: 143 mg/dL (ref 100–199)
HDL: 51 mg/dL (ref >39)
LDL Chol Calc (NIH): 66 mg/dL (ref 0–99)
Triglycerides: 150 mg/dL — ABNORMAL HIGH (ref 0–149)
VLDL Cholesterol Cal: 26 mg/dL (ref 5–40)

## 2024-03-04 LAB — TSH: TSH: 2.41 u[IU]/mL (ref 0.450–4.500)

## 2024-03-06 NOTE — Progress Notes (Signed)
 Amy Mills                                          MRN: [id number]   03/06/2024   The [facility] Quality Team Specialist reviewed this patient medical record for the purposes of chart review for care gap closure. The following were reviewed: chart review for care gap closure-colorectal cancer screening.    [facility] Quality Team

## 2024-03-10 ENCOUNTER — Ambulatory Visit: Admitting: Family Medicine

## 2024-03-10 ENCOUNTER — Encounter: Payer: Self-pay | Admitting: Family Medicine

## 2024-03-10 VITALS — BP 108/60 | HR 76 | Temp 98.6°F | Ht 62.0 in | Wt 178.0 lb

## 2024-03-10 DIAGNOSIS — E7800 Pure hypercholesterolemia, unspecified: Secondary | ICD-10-CM | POA: Diagnosis not present

## 2024-03-10 DIAGNOSIS — Z853 Personal history of malignant neoplasm of breast: Secondary | ICD-10-CM

## 2024-03-10 DIAGNOSIS — I10 Essential (primary) hypertension: Secondary | ICD-10-CM

## 2024-03-10 DIAGNOSIS — E2839 Other primary ovarian failure: Secondary | ICD-10-CM

## 2024-03-10 DIAGNOSIS — E6609 Other obesity due to excess calories: Secondary | ICD-10-CM

## 2024-03-10 DIAGNOSIS — R7303 Prediabetes: Secondary | ICD-10-CM

## 2024-03-10 DIAGNOSIS — Z0000 Encounter for general adult medical examination without abnormal findings: Secondary | ICD-10-CM

## 2024-03-10 DIAGNOSIS — Z6832 Body mass index (BMI) 32.0-32.9, adult: Secondary | ICD-10-CM

## 2024-03-10 DIAGNOSIS — E66811 Obesity, class 1: Secondary | ICD-10-CM

## 2024-03-10 DIAGNOSIS — Z1211 Encounter for screening for malignant neoplasm of colon: Secondary | ICD-10-CM | POA: Diagnosis not present

## 2024-03-10 NOTE — Assessment & Plan Note (Signed)
 Discussed how this problem influences overall health and the risks it imposes  Reviewed plan for weight loss with lower calorie diet (via better food choices (lower glycemic and portion control) along with exercise building up to or more than 30 minutes 5 days per week including some aerobic activity and strength training

## 2024-03-10 NOTE — Patient Instructions (Addendum)
 If you get a dirty wound-you need a tetanus shot  I put the order for a cologuard kit  Please let us  know if you don't hear in 1-2 weeks to set that up (mychart message or call or letter)   Keep walking  Add some strength training to your routine, this is important for bone and brain health and can reduce your risk of falls and help your body use insulin properly and regulate weight  Light weights, exercise bands , and internet videos are a good way to start  Yoga (chair or regular), machines , floor exercises or a gym with machines are also good options   Avoid added sugars in your diet when you can  Try to get most of your carbohydrates from produce (with the exception of white potatoes) and whole grains Eat less bread/pasta/rice/snack foods/cereals/sweets and other items from the middle of the grocery store (processed carbs)    You have an order for:  []   3D Mammogram  [x]   Bone Density     Please call for appointment:   [x]   [facility]  [address] [place] [address]  [phone]  []   [facility])   [address]  [place], [place] [address]  [phone]  []   [facility]       [address] [place], [place]        [phone]         []   [facility]  [address] [place] [address] [place], [place]  [phone]  []  [facility]   [address]   [place], [place] [address]  [phone]  []  [facility]  [address] [place], [place] [address] [phone]    Make sure to wear two piece clothing  No lotions powders or deodorants the day of the appointment Make sure to bring picture ID and insurance card.  Bring list of medications you are currently taking including any supplements.   Schedule your screening mammogram  through MyChart!   Select [facility] imaging sites can now be scheduled through MyChart.  Log into your MyChart account.  Go to 'Visit' (or 'Appointments' if  on mobile App) --> Schedule an  Appointment  Under 'Select a Reason for Visit' choose the Mammogram  Screening option.  Complete the pre-visit questions  and select the time and place that  best fits your schedule

## 2024-03-10 NOTE — Assessment & Plan Note (Signed)
 Better with addn of losartan  bp in fair control at this time  BP Readings from Last 1 Encounters:  03/10/24 108/60   No changes needed Most recent labs reviewed  Disc lifstyle change with low sodium diet and exercise  Continue losartan  50 mg daily and atenolol  25 mg daily

## 2024-03-10 NOTE — Progress Notes (Signed)
 Subjective:    Patient ID: Amy Mills, female    DOB: 09-12-1950, 73 y.o.   MRN: [id number]  HPI  Here for health maintenance exam and to review chronic medical problems   Wt Readings from Last 3 Encounters:  03/10/24 178 lb (80.7 kg)  11/26/23 177 lb (80.3 kg)  09/20/23 182 lb 6 oz (82.7 kg)   32.56 kg/m  Vitals:   03/10/24 1518  BP: 108/60  Pulse: 76  Temp: 98.6 F (37 C)  SpO2: 98%    Immunization History  Administered Date(s) Administered   PFIZER(Purple Top)SARS-COV-2 Vaccination 06/01/2019, 06/22/2019   Pneumococcal Conjugate-13 08/28/2017   Td 05/07/1997, 05/07/2005    Health Maintenance Due  Topic Date Due   Pneumococcal Vaccine: 50+ Years (2 of 2 - PCV20 or PCV21) 08/29/2018   Colonoscopy  05/15/2020   Mammogram  10/03/2023   Taking beet root over the counter  Has helped her pain and inflammation -especially in her hips   Declines flu shot  Shingrix -declines  Pna , tetanus -declines   Mammogram 10/2023  Personal history of breast cancer  Self breast exam- no lumps or changes  Has 2 years post treatment visit in dec - rad onc /onc   Gyn health Hysterectomy    Colon cancer screening  Colonoscopy 2012  Interested in cologuard , would do colonoscopy if positive   Bone health  Dexa  10/2017  normal bmd  Falls-one fall / slipped on water (from dog)  Fractures-none  Supplements vitamin D    Exercise  Walking a lot  Some chair yoga also  Work gets in the way   May retire in April    Mood    03/10/2024    3:35 PM 11/26/2023    1:10 PM 01/28/2023    4:05 PM 11/21/2022    2:06 PM 09/28/2022    3:14 PM  Depression screen PHQ 2/9  Decreased Interest 0 0 0 0 0  Down, Depressed, Hopeless 0 0 0 0 0  PHQ - 2 Score 0 0 0 0 0  Altered sleeping 0  1 0 0  Tired, decreased energy 0  1 0 0  Change in appetite 0  0 0 0  Feeling bad or failure about yourself  0  0 0 0  Trouble concentrating 0  0 0 0  Moving slowly or fidgety/restless 0  0 0 0   Suicidal thoughts 0  0 0 0  PHQ-9 Score 0  2 0 0  Difficult doing work/chores Not difficult at all  Not difficult at all Not difficult at all Not difficult at all   HTN with a fib  bp is stable today  No cp or palpitations or headaches or edema  No side effects to medicines  BP Readings from Last 3 Encounters:  03/10/24 108/60  09/20/23 132/70  07/08/23 130/76     Lab Results  Component Value Date   NA 141 03/03/2024   K 4.6 03/03/2024   CO2 22 03/03/2024   GLUCOSE 126 (H) 03/03/2024   BUN 14 03/03/2024   CREATININE 0.83 03/03/2024   CALCIUM  9.3 03/03/2024   GFR 53.64 (L) 02/12/2022   EGFR 74 03/03/2024   GFRNONAA 69 09/24/2007   Losartan  50 mg daily  Atenolol  25 mg daily  GFR is good   Hyperlipidemia Lab Results  Component Value Date   CHOL 143 03/03/2024   CHOL 128 02/25/2023   CHOL 107 02/12/2022   Lab Results  Component  Value Date   HDL 51 03/03/2024   HDL 53 02/25/2023   HDL 21.40 (L) 02/12/2022   Lab Results  Component Value Date   LDLCALC 66 03/03/2024   LDLCALC 54 02/25/2023   LDLCALC 54 02/12/2022   Lab Results  Component Value Date   TRIG 150 (H) 03/03/2024   TRIG 121 02/25/2023   TRIG 156.0 (H) 02/12/2022   Lab Results  Component Value Date   CHOLHDL 2.8 03/03/2024   CHOLHDL 2.4 02/25/2023   CHOLHDL 5 02/12/2022   Lab Results  Component Value Date   LDLDIRECT 212.8 07/02/2006   Crestor  40 mg daily    Prediabetes Lab Results  Component Value Date   HGBA1C 6.2 (H) 03/03/2024   HGBA1C 6.5 02/12/2022   HGBA1C 6.0 12/05/2020   Diet is fair  Good and bad days  Eating more salads for lunch  More fruit instead of sweet   Some times too much carb   Lab Results  Component Value Date   WBC 5.8 03/03/2024   HGB 13.8 03/03/2024   HCT 42.9 03/03/2024   MCV 94 03/03/2024   PLT 253 03/03/2024   Lab Results  Component Value Date   ALT 22 03/03/2024   AST 21 03/03/2024   ALKPHOS 84 03/03/2024   BILITOT 0.4 03/03/2024   Lab  Results  Component Value Date   TSH 2.410 03/03/2024      Patient Active Problem List   Diagnosis Date Noted   Osteoarthritis of fingers of both hands 09/20/2023   New onset atrial fibrillation (HCC) 05/28/2023   Hypercoagulable state due to paroxysmal atrial fibrillation (HCC) 05/28/2023   Elevated CK 03/05/2023   Clavicle pain 01/28/2023   Joint pain 01/28/2023   Carotid atherosclerosis 10/22/2022   Chronic cerebrovascular accident (CVA) 09/14/2022   Headache 09/05/2022   Onychomycosis 04/13/2022   History of breast cancer 09/12/2021   Colon cancer screening 12/12/2020   Estrogen deficiency 08/28/2017   Prediabetes 06/13/2015   Obesity 06/13/2015   Routine general medical examination at a health care facility 09/14/2011   STRESS REACTION, ACUTE, WITH EMOTIONAL DISTURBANCE 05/12/2008   HYPERCHOLESTEROLEMIA 04/21/2007   Essential hypertension 04/21/2007   History of recurrent UTIs 04/21/2007   Diffuse myofascial pain syndrome 04/21/2007   RETRACTION/LAG, EYELID 09/20/2006   PARESTHESIA 09/20/2006   MRI, BRAIN, ABNORMAL 09/20/2006   Past Medical History:  Diagnosis Date   Anxiety reaction    Back pain    Breast cancer (HCC)    CVA (cerebrovascular accident) (HCC)    Fibromyalgia    History of recurrent UTIs    HLD (hyperlipidemia)    Hypertension    Past Surgical History:  Procedure Laterality Date   ABDOMINAL HYSTERECTOMY     APPENDECTOMY  1968   BREAST LUMPECTOMY     OVARIAN CYST REMOVAL     TEE WITHOUT CARDIOVERSION N/A 01/18/2023   Procedure: TRANSESOPHAGEAL ECHOCARDIOGRAM;  Surgeon: Francyne Headland, MD;  Location: [facility];  Service: Cardiovascular;  Laterality: N/A;   TONSILLECTOMY     Social History   Tobacco Use   Smoking status: Never   Smokeless tobacco: Never   Tobacco comments:    Never smoked 07/08/23  Vaping Use   Vaping status: Never Used  Substance Use Topics   Alcohol use: Yes    Alcohol/week: 0.0 standard drinks of alcohol     Comment: rare   Drug use: No   Family History  Problem Relation Age of Onset   Heart failure Mother  Thyroid  nodules Mother    Heart disease Mother    Coronary artery disease Mother    Hypertension Father    Dementia Father    Alcohol abuse Brother    Hyperlipidemia Daughter    Hypertension Daughter    Cancer Maternal Aunt        breast   Breast cancer Maternal Aunt    Cancer Paternal Aunt        breast   Breast cancer Paternal Aunt    Alcohol abuse Son    Allergies  Allergen Reactions   Amoxicillin-Pot Clavulanate Diarrhea   Doxycycline  Nausea And Vomiting   Nitrofurantoin Hives   Current Outpatient Medications on File Prior to Visit  Medication Sig Dispense Refill   acetaminophen  (TYLENOL ) 325 MG tablet Take 650-975 mg by mouth as needed for moderate pain (pain score 4-6).     atenolol  (TENORMIN ) 25 MG tablet TAKE ONE TABLET BY MOUTH ONCE A DAY 90 tablet 0   cholecalciferol (VITAMIN D3) 25 MCG (1000 UNIT) tablet Take 1,000 Units by mouth daily.     Cyanocobalamin (B-12 PO) Take 1 capsule by mouth daily.     ELIQUIS  5 MG TABS tablet TAKE 1 TABLET BY MOUTH TWICE A DAY 60 tablet 3   losartan  (COZAAR ) 50 MG tablet TAKE ONE TABLET (50 MG TOTAL) BY MOUTH DAILY. 90 tablet 0   Misc Natural Products (BEET ROOT PO) Take 2 capsules by mouth daily.     Multiple Vitamins-Minerals (WOMENS MULTI PO) Take 1 tablet by mouth daily.       rosuvastatin  (CRESTOR ) 40 MG tablet TAKE ONE TABLET (40 MG TOTAL) BY MOUTH DAILY 90 tablet 0   SUMAtriptan  (IMITREX ) 50 MG tablet TAKE ONE TABLET BY MOUTH EVERY TWO HOURS AS NEEDED FOR MIGRAINE. MAX TWO DOSES DAILY 10 tablet 1   trimethoprim  (TRIMPEX ) 100 MG tablet TAKE ONE TABLET BY MOUTH ONCE A DAY 90 tablet 0   No current facility-administered medications on file prior to visit.    Review of Systems  Constitutional:  Negative for activity change, appetite change, fatigue, fever and unexpected weight change.  HENT:  Negative for congestion, ear  pain, rhinorrhea, sinus pressure and sore throat.   Eyes:  Negative for pain, redness and visual disturbance.  Respiratory:  Negative for cough, shortness of breath and wheezing.   Cardiovascular:  Negative for chest pain and palpitations.  Gastrointestinal:  Negative for abdominal pain, blood in stool, constipation and diarrhea.  Endocrine: Negative for polydipsia and polyuria.  Genitourinary:  Negative for dysuria, frequency and urgency.  Musculoskeletal:  Negative for arthralgias, back pain and myalgias.  Skin:  Negative for pallor and rash.  Allergic/Immunologic: Negative for environmental allergies.  Neurological:  Negative for dizziness, syncope and headaches.  Hematological:  Negative for adenopathy. Does not bruise/bleed easily.  Psychiatric/Behavioral:  Negative for decreased concentration and dysphoric mood. The patient is not nervous/anxious.        Objective:   Physical Exam Constitutional:      General: She is not in acute distress.    Appearance: Normal appearance. She is well-developed. She is obese. She is not ill-appearing or diaphoretic.  HENT:     Head: Normocephalic and atraumatic.     Right Ear: Tympanic membrane, ear canal and external ear normal.     Left Ear: Tympanic membrane, ear canal and external ear normal.     Nose: Nose normal. No congestion.     Mouth/Throat:     Mouth: Mucous membranes are moist.  Pharynx: Oropharynx is clear. No posterior oropharyngeal erythema.  Eyes:     General: No scleral icterus.    Extraocular Movements: Extraocular movements intact.     Conjunctiva/sclera: Conjunctivae normal.     Pupils: Pupils are equal, round, and reactive to light.  Neck:     Thyroid : No thyromegaly.     Vascular: No carotid bruit or JVD.  Cardiovascular:     Rate and Rhythm: Normal rate and regular rhythm.     Pulses: Normal pulses.     Heart sounds: Normal heart sounds.     No gallop.  Pulmonary:     Effort: Pulmonary effort is normal. No  respiratory distress.     Breath sounds: Normal breath sounds. No wheezing.     Comments: Good air exch Chest:     Chest wall: No tenderness.  Abdominal:     General: Bowel sounds are normal. There is no distension or abdominal bruit.     Palpations: Abdomen is soft. There is no mass.     Tenderness: There is no abdominal tenderness.     Hernia: No hernia is present.  Genitourinary:    Comments: Breast exam done by oncology/ radiology Musculoskeletal:        General: No tenderness. Normal range of motion.     Cervical back: Normal range of motion and neck supple. No rigidity. No muscular tenderness.     Right lower leg: No edema.     Left lower leg: No edema.     Comments: No kyphosis   Lymphadenopathy:     Cervical: No cervical adenopathy.  Skin:    General: Skin is warm and dry.     Coloration: Skin is not pale.     Findings: No erythema or rash.     Comments: Solar lentigines diffusely   Neurological:     Mental Status: She is alert. Mental status is at baseline.     Cranial Nerves: No cranial nerve deficit.     Motor: No abnormal muscle tone.     Coordination: Coordination normal.     Gait: Gait normal.     Deep Tendon Reflexes: Reflexes are normal and symmetric. Reflexes normal.  Psychiatric:        Mood and Affect: Mood normal.        Cognition and Memory: Cognition and memory normal.           Assessment & Plan:   Problem List Items Addressed This Visit       Cardiovascular and Mediastinum   Essential hypertension   Better with addn of losartan  bp in fair control at this time  BP Readings from Last 1 Encounters:  03/10/24 108/60   No changes needed Most recent labs reviewed  Disc lifstyle change with low sodium diet and exercise  Continue losartan  50 mg daily and atenolol  25 mg daily         Other   Routine general medical examination at a health care facility - Primary   Reviewed health habits including diet and exercise and skin cancer  prevention Reviewed appropriate screening tests for age  Also reviewed health mt list, fam hx and immunization status , as well as social and family history   See HPI Labs reviewed and ordered Health Maintenance  Topic Date Due   Pneumococcal Vaccine for age over 72 (2 of 2 - PCV20 or PCV21) 08/29/2018   Colon Cancer Screening  05/15/2020   Breast Cancer Screening  10/03/2023   Flu Shot  08/04/2024*   DTaP/Tdap/Td vaccine (3 - Tdap) 03/10/2025*   Zoster (Shingles) Vaccine (1 of 2) 12/12/2025*   COVID-19 Vaccine (3 - Pfizer risk series) 03/19/2026*   Medicare Annual Wellness Visit  11/25/2024   DEXA scan (bone density measurement)  Completed   Hepatitis C Screening  Completed   Meningitis B Vaccine  Aged Out  *Topic was postponed. The date shown is not the original due date.   Declines immunizations incl pna, flu , shingrix and tetanus  Cologuard ordered  Dexa ordered  (one fall/no fractures ) Discussed fall prevention, supplements and exercise for bone density  PHQ 0        Prediabetes   Lab Results  Component Value Date   HGBA1C 6.2 (H) 03/03/2024   HGBA1C 6.5 02/12/2022   HGBA1C 6.0 12/05/2020   disc imp of low glycemic diet and wt loss to prevent DM2       Obesity   Discussed how this problem influences overall health and the risks it imposes  Reviewed plan for weight loss with lower calorie diet (via better food choices (lower glycemic and portion control) along with exercise building up to or more than 30 minutes 5 days per week including some aerobic activity and strength training         HYPERCHOLESTEROLEMIA   Disc goals for lipids and reasons to control them Rev last labs with pt Rev low sat fat diet in detail Continues crestor  40 mg daily   LDLof 66        History of breast cancer   Under onc / rad care  Mammo 10/2023 Doing well        Estrogen deficiency   Dexa ordered       Relevant Orders   DG Bone Density   Colon cancer screening    Cologuard ordered  Pt is open to colonoscopy if this is positive      Relevant Orders   Cologuard

## 2024-03-10 NOTE — Assessment & Plan Note (Signed)
 Under onc / rad care  Mammo 10/2023 Doing well

## 2024-03-10 NOTE — Assessment & Plan Note (Signed)
 Disc goals for lipids and reasons to control them Rev last labs with pt Rev low sat fat diet in detail Continues crestor  40 mg daily   LDLof 66

## 2024-03-10 NOTE — Assessment & Plan Note (Signed)
 Dexa ordered

## 2024-03-10 NOTE — Assessment & Plan Note (Signed)
 Reviewed health habits including diet and exercise and skin cancer prevention Reviewed appropriate screening tests for age  Also reviewed health mt list, fam hx and immunization status , as well as social and family history   See HPI Labs reviewed and ordered Health Maintenance  Topic Date Due   Pneumococcal Vaccine for age over 88 (2 of 2 - PCV20 or PCV21) 08/29/2018   Colon Cancer Screening  05/15/2020   Breast Cancer Screening  10/03/2023   Flu Shot  08/04/2024*   DTaP/Tdap/Td vaccine (3 - Tdap) 03/10/2025*   Zoster (Shingles) Vaccine (1 of 2) 12/12/2025*   COVID-19 Vaccine (3 - Pfizer risk series) 03/19/2026*   Medicare Annual Wellness Visit  11/25/2024   DEXA scan (bone density measurement)  Completed   Hepatitis C Screening  Completed   Meningitis B Vaccine  Aged Out  *Topic was postponed. The date shown is not the original due date.   Declines immunizations incl pna, flu , shingrix and tetanus  Cologuard ordered  Dexa ordered  (one fall/no fractures ) Discussed fall prevention, supplements and exercise for bone density  PHQ 0

## 2024-03-10 NOTE — Assessment & Plan Note (Signed)
 Lab Results  Component Value Date   HGBA1C 6.2 (H) 03/03/2024   HGBA1C 6.5 02/12/2022   HGBA1C 6.0 12/05/2020   disc imp of low glycemic diet and wt loss to prevent DM2

## 2024-03-10 NOTE — Assessment & Plan Note (Signed)
 Cologuard ordered  Pt is open to colonoscopy if this is positive

## 2024-03-29 ENCOUNTER — Ambulatory Visit (INDEPENDENT_AMBULATORY_CARE_PROVIDER_SITE_OTHER)

## 2024-03-29 DIAGNOSIS — I639 Cerebral infarction, unspecified: Secondary | ICD-10-CM

## 2024-03-30 ENCOUNTER — Ambulatory Visit: Payer: Self-pay | Admitting: Cardiology

## 2024-03-30 LAB — CUP PACEART REMOTE DEVICE CHECK
Date Time Interrogation Session: 20251123001700
Implantable Pulse Generator Implant Date: 20241209
Pulse Gen Serial Number: 126424

## 2024-03-31 NOTE — Progress Notes (Signed)
 Remote Loop Recorder Transmission

## 2024-04-10 ENCOUNTER — Other Ambulatory Visit: Payer: Self-pay | Admitting: Family Medicine

## 2024-04-30 ENCOUNTER — Ambulatory Visit (INDEPENDENT_AMBULATORY_CARE_PROVIDER_SITE_OTHER)

## 2024-04-30 DIAGNOSIS — I639 Cerebral infarction, unspecified: Secondary | ICD-10-CM | POA: Diagnosis not present

## 2024-04-30 LAB — CUP PACEART REMOTE DEVICE CHECK
Date Time Interrogation Session: 20251224002000
Implantable Pulse Generator Implant Date: 20241209
Pulse Gen Serial Number: 126424

## 2024-05-01 NOTE — Progress Notes (Signed)
 Remote Loop Recorder Transmission

## 2024-05-03 ENCOUNTER — Ambulatory Visit: Payer: Self-pay | Admitting: Cardiology

## 2024-05-14 ENCOUNTER — Other Ambulatory Visit (HOSPITAL_COMMUNITY): Payer: Self-pay | Admitting: Internal Medicine

## 2024-05-30 ENCOUNTER — Encounter

## 2024-05-31 ENCOUNTER — Ambulatory Visit: Attending: Cardiology

## 2024-05-31 DIAGNOSIS — I639 Cerebral infarction, unspecified: Secondary | ICD-10-CM

## 2024-06-01 ENCOUNTER — Ambulatory Visit: Payer: Self-pay | Admitting: Cardiology

## 2024-06-01 LAB — CUP PACEART REMOTE DEVICE CHECK
Date Time Interrogation Session: 20260125002300
Implantable Pulse Generator Implant Date: 20241209
Pulse Gen Serial Number: 126424

## 2024-06-04 NOTE — Progress Notes (Signed)
 Remote Loop Recorder Transmission

## 2024-07-01 ENCOUNTER — Ambulatory Visit

## 2024-08-01 ENCOUNTER — Ambulatory Visit

## 2024-09-01 ENCOUNTER — Ambulatory Visit

## 2024-10-02 ENCOUNTER — Ambulatory Visit

## 2024-11-02 ENCOUNTER — Ambulatory Visit

## 2024-11-26 ENCOUNTER — Ambulatory Visit

## 2024-12-03 ENCOUNTER — Ambulatory Visit

## 2025-01-03 ENCOUNTER — Ambulatory Visit

## 2025-02-03 ENCOUNTER — Ambulatory Visit

## 2025-03-06 ENCOUNTER — Ambulatory Visit

## 2025-04-06 ENCOUNTER — Ambulatory Visit

## 2025-05-08 ENCOUNTER — Ambulatory Visit

## 2025-06-07 ENCOUNTER — Ambulatory Visit
